# Patient Record
Sex: Female | Born: 1955 | Race: White | Hispanic: No | Marital: Married | Smoking: Former smoker
Health system: Southern US, Community
[De-identification: ages and names within clinical notes are randomized; demographics above are authoritative.]

## PROBLEM LIST (undated history)

## (undated) DIAGNOSIS — J449 Chronic obstructive pulmonary disease, unspecified: Secondary | ICD-10-CM

## (undated) DIAGNOSIS — J439 Emphysema, unspecified: Secondary | ICD-10-CM

## (undated) DIAGNOSIS — E119 Type 2 diabetes mellitus without complications: Secondary | ICD-10-CM

## (undated) DIAGNOSIS — K219 Gastro-esophageal reflux disease without esophagitis: Secondary | ICD-10-CM

## (undated) DIAGNOSIS — I10 Essential (primary) hypertension: Secondary | ICD-10-CM

## (undated) DIAGNOSIS — L900 Lichen sclerosus et atrophicus: Secondary | ICD-10-CM

## (undated) DIAGNOSIS — R0902 Hypoxemia: Secondary | ICD-10-CM

## (undated) DIAGNOSIS — J45909 Unspecified asthma, uncomplicated: Secondary | ICD-10-CM

## (undated) DIAGNOSIS — R002 Palpitations: Secondary | ICD-10-CM

## (undated) HISTORY — DX: Palpitations: R00.2

## (undated) HISTORY — DX: Emphysema, unspecified: J43.9

## (undated) HISTORY — PX: CHOLECYSTECTOMY: SHX55

## (undated) HISTORY — DX: Lichen sclerosus et atrophicus: L90.0

## (undated) HISTORY — DX: Type 2 diabetes mellitus without complications: E11.9

## (undated) HISTORY — DX: Chronic obstructive pulmonary disease, unspecified: J44.9

## (undated) HISTORY — DX: Hypoxemia: R09.02

## (undated) HISTORY — DX: Gastro-esophageal reflux disease without esophagitis: K21.9

## (undated) HISTORY — PX: REFRACTIVE SURGERY: SHX103

## (undated) HISTORY — DX: Essential (primary) hypertension: I10

## (undated) HISTORY — DX: Unspecified asthma, uncomplicated: J45.909

---

## 1988-06-13 HISTORY — PX: TUBAL LIGATION: SHX77

## 1998-01-02 ENCOUNTER — Other Ambulatory Visit: Admission: RE | Admit: 1998-01-02 | Discharge: 1998-01-02 | Payer: Self-pay | Admitting: Obstetrics and Gynecology

## 2000-11-21 ENCOUNTER — Other Ambulatory Visit: Admission: RE | Admit: 2000-11-21 | Discharge: 2000-11-21 | Payer: Self-pay | Admitting: Obstetrics and Gynecology

## 2002-05-23 ENCOUNTER — Ambulatory Visit (HOSPITAL_COMMUNITY): Admission: RE | Admit: 2002-05-23 | Discharge: 2002-05-23 | Payer: Self-pay | Admitting: Obstetrics and Gynecology

## 2002-05-23 ENCOUNTER — Encounter: Payer: Self-pay | Admitting: Obstetrics and Gynecology

## 2003-06-17 ENCOUNTER — Ambulatory Visit (HOSPITAL_COMMUNITY): Admission: RE | Admit: 2003-06-17 | Discharge: 2003-06-17 | Payer: Self-pay | Admitting: Obstetrics and Gynecology

## 2003-11-03 ENCOUNTER — Encounter: Admission: RE | Admit: 2003-11-03 | Discharge: 2003-11-03 | Payer: Self-pay | Admitting: Family Medicine

## 2005-03-07 ENCOUNTER — Ambulatory Visit (HOSPITAL_COMMUNITY): Admission: RE | Admit: 2005-03-07 | Discharge: 2005-03-07 | Payer: Self-pay | Admitting: Obstetrics and Gynecology

## 2006-10-10 ENCOUNTER — Ambulatory Visit: Payer: Self-pay | Admitting: Cardiology

## 2006-11-02 ENCOUNTER — Ambulatory Visit: Payer: Self-pay

## 2006-11-13 ENCOUNTER — Ambulatory Visit: Payer: Self-pay | Admitting: Cardiology

## 2009-04-17 ENCOUNTER — Ambulatory Visit (HOSPITAL_COMMUNITY): Admission: RE | Admit: 2009-04-17 | Discharge: 2009-04-17 | Payer: Self-pay | Admitting: Family Medicine

## 2009-06-13 DIAGNOSIS — E7800 Pure hypercholesterolemia, unspecified: Secondary | ICD-10-CM | POA: Insufficient documentation

## 2009-06-13 HISTORY — DX: Pure hypercholesterolemia, unspecified: E78.00

## 2010-05-13 HISTORY — PX: DILATION AND CURETTAGE, DIAGNOSTIC / THERAPEUTIC: SUR384

## 2010-05-25 ENCOUNTER — Ambulatory Visit (HOSPITAL_COMMUNITY)
Admission: RE | Admit: 2010-05-25 | Discharge: 2010-05-25 | Payer: Self-pay | Source: Home / Self Care | Attending: Family Medicine | Admitting: Family Medicine

## 2010-05-28 ENCOUNTER — Ambulatory Visit (HOSPITAL_COMMUNITY)
Admission: RE | Admit: 2010-05-28 | Discharge: 2010-05-28 | Payer: Self-pay | Source: Home / Self Care | Attending: Obstetrics and Gynecology | Admitting: Obstetrics and Gynecology

## 2010-06-18 ENCOUNTER — Encounter
Admission: RE | Admit: 2010-06-18 | Discharge: 2010-06-18 | Payer: Self-pay | Source: Home / Self Care | Attending: Family Medicine | Admitting: Family Medicine

## 2010-07-28 ENCOUNTER — Other Ambulatory Visit: Payer: Self-pay | Admitting: Family Medicine

## 2010-07-28 ENCOUNTER — Ambulatory Visit
Admission: RE | Admit: 2010-07-28 | Discharge: 2010-07-28 | Disposition: A | Discharge status: Home / Self Care | Payer: PRIVATE HEALTH INSURANCE | Source: Ambulatory Visit | Attending: Family Medicine | Admitting: Family Medicine

## 2010-07-28 DIAGNOSIS — R0602 Shortness of breath: Secondary | ICD-10-CM

## 2010-07-29 ENCOUNTER — Ambulatory Visit (HOSPITAL_COMMUNITY)
Admission: RE | Admit: 2010-07-29 | Discharge: 2010-07-29 | Disposition: A | Discharge status: Home / Self Care | Payer: PRIVATE HEALTH INSURANCE | Source: Ambulatory Visit | Attending: Family Medicine | Admitting: Family Medicine

## 2010-07-29 DIAGNOSIS — R0602 Shortness of breath: Secondary | ICD-10-CM | POA: Insufficient documentation

## 2010-08-24 LAB — DIFFERENTIAL
Eosinophils Relative: 4 % (ref 0–5)
Lymphocytes Relative: 22 % (ref 12–46)
Lymphs Abs: 2 10*3/uL (ref 0.7–4.0)
Monocytes Absolute: 0.5 10*3/uL (ref 0.1–1.0)

## 2010-08-24 LAB — COMPREHENSIVE METABOLIC PANEL
AST: 21 U/L (ref 0–37)
Albumin: 4.1 g/dL (ref 3.5–5.2)
Calcium: 9.6 mg/dL (ref 8.4–10.5)
Creatinine, Ser: 0.67 mg/dL (ref 0.4–1.2)
GFR calc Af Amer: >60 mL/min (ref >60)
GFR calc non Af Amer: >60 mL/min (ref >60)

## 2010-08-24 LAB — CBC
MCH: 30.1 pg (ref 26.0–34.0)
MCHC: 33.7 g/dL (ref 30.0–36.0)
Platelets: 327 10*3/uL (ref 150–400)
RDW: 13.1 % (ref 11.5–15.5)

## 2010-10-26 NOTE — Assessment & Plan Note (Signed)
[facility]                            CARDIOLOGY OFFICE NOTE   Amanda Garrett, Amanda Garrett                      MRN:          [id number]  DATE:11/13/2006                            DOB:          1956-01-03    PRIMARY CARE PHYSICIAN:  Windle Guard, M.D.   REASON FOR VISIT:  Follow cardiac testing.   HISTORY OF PRESENT ILLNESS:  I saw Amanda Garrett back in April.  She had had  an episode of chest pain and also some palpitations with documented  atrial ectopy.  I placed her on a beta blocker and she states that she  has been taking her ToprolXL in the evening and resting more  comfortably.  She still has some palpitations during the daytime,  however.  She had a follow up echocardiogram demonstrating normal left  ventricular systolic function with no major valvular abnormalities and  an ejection fraction of 60%.  Her Myoview study revealed an ejection  fraction of 67% with a minimal area of decreased perfusion in the mid to  distal anterior wall that was felt likely variable breast attenuation  rather than obvious ischemia.  She had no ST segment changes with  Adenosine and low level exercise.  Ms.  Lambert Garrett states that she has had no  further episodes of chest pain.  Today, we talked about advancing her  beta blocker to see if this helped her palpitations more and otherwise  continuing strategy of observation.  She was comfortable with this.   ALLERGIES:  No known drug allergies.   PRESENT MEDICATIONS:  1. Centrum Silver daily.  2. Os-Cal and Vitamin D 500 mg p.o. daily.  3. Lutein 6 mg daily.  4. Omega-3 fish oil supplements.  5. Vitamin E and C supplements.  6. Aspirin 81 mg p.o. daily.  7. Omeprazole 20 mg p.o. b.i.d.  8. Gemfibrozil 600 mg p.o. daily.  9. Metoprolol 25 mg p.o. q.h.s.   REVIEW OF SYSTEMS:  As described in history of present illness.   PHYSICAL EXAMINATION:  VITAL SIGNS:  Blood pressure 138/95, heart rate  90 with ectopic beats.  GENERAL:  The patient is comfortable and in no acute distress.  NECK:  No elevated jugular venous pressure.  Without bruits.  No  thyromegaly is noted.  LUNGS:  Clear with unlabored breathing at rest.  CARDIAC:  A largely regular rate and rhythm with frequent ectopic beats.  ABDOMEN:  No hepatomegaly or bruits.  EXTREMITIES:  No pitting edema.   IMPRESSION/RECOMMENDATIONS:  1. Palpitations and documented atrial ectopy.  I have asked Amanda Garrett      to increase her Toprol XL to 25 mg p.o. b.i.d.  I would, otherwise,      continue a strategy of observation.  We can certainly see her back      if symptoms progress.  Otherwise, she had overall low risk cardiac      testing showing normal ejection fraction and no obvious large      degree of ischemia.  I have asked her to be mindful of any symptoms      change,  and certainly if she has any progression, we can see her      back and evaluate things further.  2. Otherwise, continue regular follow up with Dr. Jeannetta Nap.     Jonelle Sidle, MD  Electronically Signed    SGM/MedQ  DD: 11/13/2006  DT: 11/14/2006  Job #: [id number]   cc:   Windle Guard, M.D.

## 2010-10-29 NOTE — Assessment & Plan Note (Signed)
**Amanda Garrett De-Identified via Obfuscation** Amanda Amanda Garrett                            Amanda Amanda Garrett   Amanda Amanda Garrett, Amanda Amanda Garrett                      MRN:          [id number]  DATE:10/10/2006                            DOB:          [birth year]    REASON FOR CONSULTATION:  Recent chest pain.   HISTORY OF PRESENT ILLNESS:  Amanda Amanda Garrett is a pleasant 55 year old woman  with a history of hyperlipidemia and reportedly previous reassuring  exercise Cardiolite back in 1996 (records not available). She has no  clearly documented history of myocardial infarction, type 2 diabetes  mellitus or longstanding hypertension. She states that she has been in  her usual state of health which includes dyspnea on exertion at least  over the last ten years associated with a 50 pound weight gain after  quitting smoking. She had eaten dinner out with her husband and was  riding home in the car approximately three or four weeks ago when she  experienced the sudden onset of a severe chest pain described a  pressure. It lasted approximately 5 to 10 minutes and then resolved by  taking deep breaths. She has not reported having any prior symptoms like  this and has had no subsequent recurrence. She had an electrocardiogram  obtained in Dr.  Jeannetta Nap office, the copy of which shows fairly  nonspecific ST-T wave changes. Repeat tracing today, shows sinus  tachycardia with frequent premature atrial complexes and nonspecific ST-  T wave changes. She reports a history of palpitations that is also  longstanding, but no associated syncope. She has not had any followup  cardiac evaluation in some time.   ALLERGIES:  No known drug allergies.   PRESENT MEDICATIONS:  1. Centrum Silver once daily.  2. Os-cal with vitamin D 500 mg p.o. daily.  3. Lutein 6 mg p.o. daily  4. Vitamin E and C supplements.  5. Aspirin 81 mg p.o. daily.  6. Gemfibrozil 600 mg p.o. daily.  7. Omeprazole 20 mg p.o. b.i.d.  8. Omega-3 supplements 1000 mg  p.o. daily.  9. P.r.n. Tylenol and Aleve.   PAST MEDICAL HISTORY:  Is as outlined above. She is also status post  previous tubal ligation and two Cesarean sections.   SOCIAL HISTORY:  The patient is married. She has four children. 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 She quit smoking back in 1998. Denies any alcohol use.   FAMILY HISTORY:  Was reviewed. Is non-contributory for premature  cardiovascular disease.   REVIEW OF SYSTEMS:  As described in the History of Present Illness. She  does have occasional reflux symptoms and arthritis. Does report dyspnea  on exertion, NYHA Class-II that has been chronic over the last ten  years. She reports approximately 50 pound weight gain since  discontinuing smoking.   PHYSICAL EXAMINATION:  Blood pressure 140/80, heart rate is in the 80s  with ectopic beats on my examination, although she does have some brief  runs of tachycardia. Weight is 195 pounds. The patient is comfortable  and in no acute distress.  HEENT: Conjunctivae is normal. Oropharynx is clear.  NECK: Supple, without elevated jugular pressure, without bruits. No  thyromegaly is noted.  LUNGS:  Are generally clear without labored breathing.  CARDIAC: Reveals a regular rate and rhythm with occasional to frequent  ectopy including some runs. No S3 gallop or pericardial rub is noted.  ABDOMEN: Soft. No bruits. No tenderness. No hepatomegaly.  EXTREMITIES: Exhibit no marked pitting edema.  SKIN: Is warm and dry.  Distal pulses are 1 to 2+.  MUSCULOSKELETAL: No kyphosis is noted.  NEURO/PSYCHIATRIC: The patient is alert and oriented x3.   IMPRESSIONS AND RECOMMENDATIONS:  1. Isolated episode of chest pain as outlined above. This is in a 55-      year-old woman with history of hyperlipidemia and remote tobacco      abuse. Blood pressure is elevated today, although there is no clear      longstanding history of hypertension. She  reports a previous      reassuring stress test in 1996, but has had no more recent testing.      Her electrocardiogram is nonspecific, but does show frequent atrial      ectopy. We discussed this today and our plan will be to proceed      with an echocardiogram to reassess left ventricular function and      also an adenosine Myoview for ischemic evaluation. In addition to      this, I will begin a trial of Toprol XL 25 mg daily to see if this      helps with her palpitations and frequent ectopy. I will have her      followup with me over the next three weeks to discuss the results.  2. Further plans to follow.     Jonelle Sidle, MD  Electronically Signed    SGM/MedQ  DD: 10/10/2006  DT: 10/10/2006  Job #: [id number]   cc:   Windle Guard, M.D.

## 2011-06-14 HISTORY — PX: KNEE ARTHROSCOPY: SUR90

## 2011-06-20 ENCOUNTER — Other Ambulatory Visit (HOSPITAL_COMMUNITY): Payer: Self-pay | Admitting: Obstetrics and Gynecology

## 2011-06-20 DIAGNOSIS — Z1231 Encounter for screening mammogram for malignant neoplasm of breast: Secondary | ICD-10-CM

## 2011-07-19 ENCOUNTER — Ambulatory Visit (HOSPITAL_COMMUNITY)
Admission: RE | Admit: 2011-07-19 | Discharge: 2011-07-19 | Disposition: A | Discharge status: Home / Self Care | Payer: PRIVATE HEALTH INSURANCE | Source: Ambulatory Visit | Attending: Obstetrics and Gynecology | Admitting: Obstetrics and Gynecology

## 2011-07-19 DIAGNOSIS — Z1231 Encounter for screening mammogram for malignant neoplasm of breast: Secondary | ICD-10-CM | POA: Insufficient documentation

## 2011-07-29 ENCOUNTER — Other Ambulatory Visit: Payer: Self-pay | Admitting: Family Medicine

## 2011-07-29 DIAGNOSIS — M25569 Pain in unspecified knee: Secondary | ICD-10-CM

## 2011-08-04 ENCOUNTER — Ambulatory Visit
Admission: RE | Admit: 2011-08-04 | Discharge: 2011-08-04 | Disposition: A | Discharge status: Home / Self Care | Payer: PRIVATE HEALTH INSURANCE | Source: Ambulatory Visit | Attending: Family Medicine | Admitting: Family Medicine

## 2011-08-04 DIAGNOSIS — M25569 Pain in unspecified knee: Secondary | ICD-10-CM

## 2012-11-27 ENCOUNTER — Other Ambulatory Visit (HOSPITAL_COMMUNITY): Payer: Self-pay | Admitting: Obstetrics and Gynecology

## 2012-11-27 DIAGNOSIS — Z1231 Encounter for screening mammogram for malignant neoplasm of breast: Secondary | ICD-10-CM

## 2012-11-30 ENCOUNTER — Ambulatory Visit (HOSPITAL_COMMUNITY)
Admission: RE | Admit: 2012-11-30 | Discharge: 2012-11-30 | Disposition: A | Discharge status: Home / Self Care | Payer: PRIVATE HEALTH INSURANCE | Source: Ambulatory Visit | Attending: Obstetrics and Gynecology | Admitting: Obstetrics and Gynecology

## 2012-11-30 DIAGNOSIS — Z1231 Encounter for screening mammogram for malignant neoplasm of breast: Secondary | ICD-10-CM | POA: Insufficient documentation

## 2012-12-04 ENCOUNTER — Other Ambulatory Visit: Payer: Self-pay | Admitting: Obstetrics and Gynecology

## 2012-12-04 DIAGNOSIS — R928 Other abnormal and inconclusive findings on diagnostic imaging of breast: Secondary | ICD-10-CM

## 2012-12-18 ENCOUNTER — Ambulatory Visit
Admission: RE | Admit: 2012-12-18 | Discharge: 2012-12-18 | Disposition: A | Discharge status: Home / Self Care | Payer: PRIVATE HEALTH INSURANCE | Source: Ambulatory Visit | Attending: Obstetrics and Gynecology | Admitting: Obstetrics and Gynecology

## 2012-12-18 DIAGNOSIS — R928 Other abnormal and inconclusive findings on diagnostic imaging of breast: Secondary | ICD-10-CM

## 2013-11-20 ENCOUNTER — Other Ambulatory Visit: Payer: Self-pay | Admitting: Otolaryngology

## 2013-11-20 DIAGNOSIS — H9190 Unspecified hearing loss, unspecified ear: Secondary | ICD-10-CM

## 2013-11-27 ENCOUNTER — Ambulatory Visit
Admission: RE | Admit: 2013-11-27 | Discharge: 2013-11-27 | Disposition: A | Discharge status: Home / Self Care | Payer: PRIVATE HEALTH INSURANCE | Source: Ambulatory Visit | Attending: Otolaryngology | Admitting: Otolaryngology

## 2013-11-27 DIAGNOSIS — H9190 Unspecified hearing loss, unspecified ear: Secondary | ICD-10-CM

## 2013-11-27 MED ORDER — GADOBENATE DIMEGLUMINE 529 MG/ML IV SOLN
17.0000 mL | Freq: Once | INTRAVENOUS | Status: AC | PRN
  Administered 2013-11-27: 17 mL via INTRAVENOUS

## 2017-04-21 ENCOUNTER — Other Ambulatory Visit: Payer: Self-pay | Admitting: Obstetrics and Gynecology

## 2017-04-21 DIAGNOSIS — N63 Unspecified lump in unspecified breast: Secondary | ICD-10-CM

## 2017-04-21 DIAGNOSIS — R928 Other abnormal and inconclusive findings on diagnostic imaging of breast: Secondary | ICD-10-CM

## 2017-04-28 ENCOUNTER — Ambulatory Visit
Admission: RE | Admit: 2017-04-28 | Discharge: 2017-04-28 | Disposition: A | Discharge status: Home / Self Care | Payer: PRIVATE HEALTH INSURANCE | Source: Ambulatory Visit | Attending: Obstetrics and Gynecology | Admitting: Obstetrics and Gynecology

## 2017-04-28 ENCOUNTER — Other Ambulatory Visit: Payer: Self-pay | Admitting: Obstetrics and Gynecology

## 2017-04-28 DIAGNOSIS — R928 Other abnormal and inconclusive findings on diagnostic imaging of breast: Secondary | ICD-10-CM

## 2017-04-28 DIAGNOSIS — N63 Unspecified lump in unspecified breast: Secondary | ICD-10-CM

## 2017-08-15 ENCOUNTER — Ambulatory Visit
Admission: RE | Admit: 2017-08-15 | Discharge: 2017-08-15 | Disposition: A | Discharge status: Home / Self Care | Payer: PRIVATE HEALTH INSURANCE | Source: Ambulatory Visit | Attending: Family Medicine | Admitting: Family Medicine

## 2017-08-15 ENCOUNTER — Other Ambulatory Visit: Payer: Self-pay | Admitting: Family Medicine

## 2017-08-15 DIAGNOSIS — R059 Cough, unspecified: Secondary | ICD-10-CM

## 2017-08-15 DIAGNOSIS — R05 Cough: Secondary | ICD-10-CM

## 2017-10-27 ENCOUNTER — Other Ambulatory Visit: Payer: PRIVATE HEALTH INSURANCE

## 2018-04-09 ENCOUNTER — Other Ambulatory Visit: Payer: Self-pay | Admitting: Obstetrics and Gynecology

## 2018-04-09 ENCOUNTER — Ambulatory Visit
Admission: RE | Admit: 2018-04-09 | Discharge: 2018-04-09 | Disposition: A | Discharge status: Home / Self Care | Payer: PRIVATE HEALTH INSURANCE | Source: Ambulatory Visit | Attending: Obstetrics and Gynecology | Admitting: Obstetrics and Gynecology

## 2018-04-09 DIAGNOSIS — N63 Unspecified lump in unspecified breast: Secondary | ICD-10-CM

## 2018-04-09 DIAGNOSIS — R928 Other abnormal and inconclusive findings on diagnostic imaging of breast: Secondary | ICD-10-CM

## 2018-04-09 DIAGNOSIS — Z1231 Encounter for screening mammogram for malignant neoplasm of breast: Secondary | ICD-10-CM

## 2018-04-11 ENCOUNTER — Other Ambulatory Visit: Payer: Self-pay | Admitting: Obstetrics and Gynecology

## 2018-04-11 DIAGNOSIS — N63 Unspecified lump in unspecified breast: Secondary | ICD-10-CM

## 2018-04-23 ENCOUNTER — Ambulatory Visit
Admission: RE | Admit: 2018-04-23 | Discharge: 2018-04-23 | Disposition: A | Discharge status: Home / Self Care | Payer: PRIVATE HEALTH INSURANCE | Source: Ambulatory Visit | Attending: Obstetrics and Gynecology | Admitting: Obstetrics and Gynecology

## 2018-04-23 ENCOUNTER — Ambulatory Visit: Payer: PRIVATE HEALTH INSURANCE

## 2018-04-23 DIAGNOSIS — N63 Unspecified lump in unspecified breast: Secondary | ICD-10-CM

## 2018-10-10 ENCOUNTER — Other Ambulatory Visit: Payer: PRIVATE HEALTH INSURANCE

## 2018-10-22 ENCOUNTER — Other Ambulatory Visit: Payer: PRIVATE HEALTH INSURANCE

## 2019-05-29 ENCOUNTER — Other Ambulatory Visit: Payer: Self-pay | Admitting: Obstetrics and Gynecology

## 2019-05-29 DIAGNOSIS — N6320 Unspecified lump in the left breast, unspecified quadrant: Secondary | ICD-10-CM

## 2019-06-12 ENCOUNTER — Other Ambulatory Visit: Payer: Self-pay

## 2019-06-12 ENCOUNTER — Ambulatory Visit
Admission: RE | Admit: 2019-06-12 | Discharge: 2019-06-12 | Disposition: A | Discharge status: Home / Self Care | Payer: Medicare Other | Source: Ambulatory Visit | Attending: Obstetrics and Gynecology | Admitting: Obstetrics and Gynecology

## 2019-06-12 ENCOUNTER — Ambulatory Visit
Admission: RE | Admit: 2019-06-12 | Discharge: 2019-06-12 | Disposition: A | Discharge status: Home / Self Care | Payer: Medicare HMO | Source: Ambulatory Visit | Attending: Obstetrics and Gynecology | Admitting: Obstetrics and Gynecology

## 2019-06-12 DIAGNOSIS — N6320 Unspecified lump in the left breast, unspecified quadrant: Secondary | ICD-10-CM

## 2019-08-10 ENCOUNTER — Ambulatory Visit: Payer: PRIVATE HEALTH INSURANCE

## 2019-08-15 ENCOUNTER — Ambulatory Visit: Payer: Medicare Other | Attending: Internal Medicine

## 2019-08-15 DIAGNOSIS — Z23 Encounter for immunization: Secondary | ICD-10-CM | POA: Insufficient documentation

## 2019-08-15 NOTE — Progress Notes (Signed)
   Covid-19 Vaccination Clinic  Name:  Amanda Garrett    MRN: [id number] DOB: August 06, 1955  08/15/2019  Amanda Garrett was observed post Covid-19 immunization for 15 minutes without incident. She was provided with Vaccine Information Sheet and instruction to access the V-Safe system.   Amanda Garrett was instructed to call 911 with any severe reactions post vaccine: Marland Kitchen Difficulty breathing  . Swelling of face and throat  . A fast heartbeat  . A bad rash all over body  . Dizziness and weakness   Immunizations Administered    Name Date Dose VIS Date Route   Pfizer COVID-19 Vaccine 08/15/2019  4:05 PM 0.3 mL 05/24/2019 Intramuscular   Manufacturer: [place]   Lot: [id number]   [place]: [id number]

## 2019-08-20 ENCOUNTER — Ambulatory Visit: Payer: PRIVATE HEALTH INSURANCE

## 2019-09-11 ENCOUNTER — Ambulatory Visit: Payer: Medicare Other | Attending: Internal Medicine

## 2019-09-11 DIAGNOSIS — Z23 Encounter for immunization: Secondary | ICD-10-CM

## 2019-09-11 NOTE — Progress Notes (Addendum)
   Covid-19 Vaccination Clinic  Name:  Amanda Garrett    MRN: [id number] DOB: September 29, 1955  09/11/2019  [name] was observed post Covid-19 immunization for 30 minutes without incident. She was provided with Vaccine Information Sheet and instruction to access the V-Safe system.   [name] was instructed to call 911 with any severe reactions post vaccine: Marland Kitchen Difficulty breathing  . Swelling of face and throat  . A fast heartbeat  . A bad rash all over body  . Dizziness and weakness   Immunizations Administered    Name Date Dose VIS Date Route   Pfizer COVID-19 Vaccine 09/11/2019  1:40 PM 0.3 mL 05/24/2019 Intramuscular   Manufacturer: [place]   Lot: [id number]   [place]: [id number]

## 2020-03-02 ENCOUNTER — Ambulatory Visit: Payer: Medicare Other | Attending: Internal Medicine

## 2020-03-02 DIAGNOSIS — Z23 Encounter for immunization: Secondary | ICD-10-CM

## 2020-03-02 NOTE — Progress Notes (Signed)
   Covid-19 Vaccination Clinic  Name:  BRYELLA DIVINEY    MRN: [id number] DOB: Oct 06, 1955  03/02/2020  Ms. Brosious was observed post Covid-19 immunization for 15 minutes without incident. She was provided with Vaccine Information Sheet and instruction to access the V-Safe system.   Ms. Volante was instructed to call 911 with any severe reactions post vaccine: Marland Kitchen Difficulty breathing  . Swelling of face and throat  . A fast heartbeat  . A bad rash all over body  . Dizziness and weakness

## 2020-07-23 ENCOUNTER — Other Ambulatory Visit: Payer: Self-pay | Admitting: Obstetrics and Gynecology

## 2020-07-23 DIAGNOSIS — Z1231 Encounter for screening mammogram for malignant neoplasm of breast: Secondary | ICD-10-CM

## 2020-09-11 ENCOUNTER — Other Ambulatory Visit: Payer: Self-pay

## 2020-09-11 ENCOUNTER — Ambulatory Visit
Admission: RE | Admit: 2020-09-11 | Discharge: 2020-09-11 | Disposition: A | Discharge status: Home / Self Care | Payer: Medicare Other | Source: Ambulatory Visit | Attending: Obstetrics and Gynecology | Admitting: Obstetrics and Gynecology

## 2020-09-11 DIAGNOSIS — Z1231 Encounter for screening mammogram for malignant neoplasm of breast: Secondary | ICD-10-CM

## 2020-10-01 ENCOUNTER — Encounter: Payer: Self-pay | Admitting: Gastroenterology

## 2020-11-09 ENCOUNTER — Inpatient Hospital Stay (HOSPITAL_COMMUNITY)
Admission: EM | Admit: 2020-11-09 | Discharge: 2020-11-11 | DRG: 190 | Disposition: A | Discharge status: Home / Self Care | Payer: Medicare Other | Attending: Family Medicine | Admitting: Family Medicine

## 2020-11-09 ENCOUNTER — Other Ambulatory Visit: Payer: Self-pay

## 2020-11-09 ENCOUNTER — Emergency Department (HOSPITAL_COMMUNITY): Payer: Medicare Other

## 2020-11-09 ENCOUNTER — Encounter (HOSPITAL_COMMUNITY): Payer: Self-pay | Admitting: Student

## 2020-11-09 DIAGNOSIS — Z20822 Contact with and (suspected) exposure to covid-19: Secondary | ICD-10-CM | POA: Diagnosis present

## 2020-11-09 DIAGNOSIS — Z833 Family history of diabetes mellitus: Secondary | ICD-10-CM | POA: Diagnosis not present

## 2020-11-09 DIAGNOSIS — E860 Dehydration: Secondary | ICD-10-CM | POA: Diagnosis present

## 2020-11-09 DIAGNOSIS — K219 Gastro-esophageal reflux disease without esophagitis: Secondary | ICD-10-CM | POA: Diagnosis present

## 2020-11-09 DIAGNOSIS — R7303 Prediabetes: Secondary | ICD-10-CM | POA: Diagnosis present

## 2020-11-09 DIAGNOSIS — Z87891 Personal history of nicotine dependence: Secondary | ICD-10-CM

## 2020-11-09 DIAGNOSIS — Z888 Allergy status to other drugs, medicaments and biological substances status: Secondary | ICD-10-CM

## 2020-11-09 DIAGNOSIS — I10 Essential (primary) hypertension: Secondary | ICD-10-CM | POA: Diagnosis present

## 2020-11-09 DIAGNOSIS — J189 Pneumonia, unspecified organism: Secondary | ICD-10-CM | POA: Diagnosis present

## 2020-11-09 DIAGNOSIS — Z8249 Family history of ischemic heart disease and other diseases of the circulatory system: Secondary | ICD-10-CM | POA: Diagnosis not present

## 2020-11-09 DIAGNOSIS — J440 Chronic obstructive pulmonary disease with acute lower respiratory infection: Secondary | ICD-10-CM | POA: Diagnosis present

## 2020-11-09 DIAGNOSIS — E875 Hyperkalemia: Secondary | ICD-10-CM | POA: Diagnosis present

## 2020-11-09 DIAGNOSIS — N179 Acute kidney failure, unspecified: Secondary | ICD-10-CM | POA: Diagnosis present

## 2020-11-09 DIAGNOSIS — Z91040 Latex allergy status: Secondary | ICD-10-CM | POA: Diagnosis not present

## 2020-11-09 DIAGNOSIS — E7800 Pure hypercholesterolemia, unspecified: Secondary | ICD-10-CM | POA: Diagnosis present

## 2020-11-09 DIAGNOSIS — J9811 Atelectasis: Secondary | ICD-10-CM | POA: Diagnosis present

## 2020-11-09 DIAGNOSIS — J9601 Acute respiratory failure with hypoxia: Secondary | ICD-10-CM | POA: Diagnosis present

## 2020-11-09 DIAGNOSIS — J441 Chronic obstructive pulmonary disease with (acute) exacerbation: Principal | ICD-10-CM | POA: Diagnosis present

## 2020-11-09 LAB — CBC WITH DIFFERENTIAL/PLATELET
Abs Immature Granulocytes: 0.03 10*3/uL (ref 0.00–0.07)
Basophils Absolute: 0 10*3/uL (ref 0.0–0.1)
Basophils Relative: 0 %
Eosinophils Absolute: 0.1 10*3/uL (ref 0.0–0.5)
Eosinophils Relative: 1 %
HCT: 48.4 % — ABNORMAL HIGH (ref 36.0–46.0)
Hemoglobin: 14.9 g/dL (ref 12.0–15.0)
Immature Granulocytes: 0 %
Lymphocytes Relative: 16 %
Lymphs Abs: 1.6 10*3/uL (ref 0.7–4.0)
MCH: 30.7 pg (ref 26.0–34.0)
MCHC: 30.8 g/dL (ref 30.0–36.0)
MCV: 99.8 fL (ref 80.0–100.0)
Monocytes Absolute: 0.8 10*3/uL (ref 0.1–1.0)
Monocytes Relative: 7 %
Neutro Abs: 7.9 10*3/uL — ABNORMAL HIGH (ref 1.7–7.7)
Neutrophils Relative %: 76 %
Platelets: 276 10*3/uL (ref 150–400)
RBC: 4.85 MIL/uL (ref 3.87–5.11)
RDW: 14.3 % (ref 11.5–15.5)
WBC: 10.4 10*3/uL (ref 4.0–10.5)
nRBC: 0 % (ref 0.0–0.2)

## 2020-11-09 LAB — BASIC METABOLIC PANEL
Anion gap: 8 (ref 5–15)
BUN: 23 mg/dL (ref 8–23)
CO2: 32 mmol/L (ref 22–32)
Calcium: 9.6 mg/dL (ref 8.9–10.3)
Chloride: 96 mmol/L — ABNORMAL LOW (ref 98–111)
Creatinine, Ser: 0.85 mg/dL (ref 0.44–1.00)
GFR, Estimated: >60 mL/min (ref >60)
Glucose, Bld: 146 mg/dL — ABNORMAL HIGH (ref 70–99)
Potassium: 4.9 mmol/L (ref 3.5–5.1)
Sodium: 136 mmol/L (ref 135–145)

## 2020-11-09 LAB — BLOOD GAS, ARTERIAL
Acid-Base Excess: 2.9 mmol/L — ABNORMAL HIGH (ref 0.0–2.0)
Acid-Base Excess: 0.5 mmol/L (ref 0.0–2.0)
Bicarbonate: 32.5 mmol/L — ABNORMAL HIGH (ref 20.0–28.0)
Bicarbonate: 28.9 mmol/L — ABNORMAL HIGH (ref 20.0–28.0)
Drawn by: 25780
Drawn by: 25788
FIO2: 36
FIO2: 28
O2 Saturation: 90.9 %
O2 Saturation: 93.4 %
Patient temperature: 98.6
Patient temperature: 98.6
pCO2 arterial: 76.6 mmHg (ref 32.0–48.0)
pCO2 arterial: 65.3 mmHg (ref 32.0–48.0)
pH, Arterial: 7.251 — ABNORMAL LOW (ref 7.350–7.450)
pH, Arterial: 7.268 — ABNORMAL LOW (ref 7.350–7.450)
pO2, Arterial: 71 mmHg — ABNORMAL LOW (ref 83.0–108.0)
pO2, Arterial: 78.5 mmHg — ABNORMAL LOW (ref 83.0–108.0)

## 2020-11-09 LAB — RESP PANEL BY RT-PCR (FLU A&B, COVID) ARPGX2
Influenza A by PCR: NEGATIVE
Influenza B by PCR: NEGATIVE
SARS Coronavirus 2 by RT PCR: NEGATIVE

## 2020-11-09 LAB — CBC
HCT: 48.3 % — ABNORMAL HIGH (ref 36.0–46.0)
Hemoglobin: 14.8 g/dL (ref 12.0–15.0)
MCH: 30.7 pg (ref 26.0–34.0)
MCHC: 30.6 g/dL (ref 30.0–36.0)
MCV: 100.2 fL — ABNORMAL HIGH (ref 80.0–100.0)
Platelets: 219 10*3/uL (ref 150–400)
RBC: 4.82 MIL/uL (ref 3.87–5.11)
RDW: 14.2 % (ref 11.5–15.5)
WBC: 12.2 10*3/uL — ABNORMAL HIGH (ref 4.0–10.5)
nRBC: 0 % (ref 0.0–0.2)

## 2020-11-09 LAB — BRAIN NATRIURETIC PEPTIDE: B Natriuretic Peptide: 247.9 pg/mL — ABNORMAL HIGH (ref 0.0–100.0)

## 2020-11-09 LAB — GLUCOSE, CAPILLARY
Glucose-Capillary: 160 mg/dL — ABNORMAL HIGH (ref 70–99)
Glucose-Capillary: 304 mg/dL — ABNORMAL HIGH (ref 70–99)

## 2020-11-09 LAB — CREATININE, SERUM
Creatinine, Ser: 1.05 mg/dL — ABNORMAL HIGH (ref 0.44–1.00)
GFR, Estimated: 58 mL/min — ABNORMAL LOW (ref >60)

## 2020-11-09 LAB — D-DIMER, QUANTITATIVE: D-Dimer, Quant: 1.02 ug/mL-FEU — ABNORMAL HIGH (ref 0.00–0.50)

## 2020-11-09 MED ORDER — ASPIRIN EC 81 MG PO TBEC
81.0000 mg | DELAYED_RELEASE_TABLET | Freq: Every day | ORAL | Status: DC
  Administered 2020-11-10 – 2020-11-11 (×2): 81 mg via ORAL
  Filled 2020-11-09 (×2): qty 1

## 2020-11-09 MED ORDER — PROPRANOLOL HCL 10 MG PO TABS
10.0000 mg | ORAL_TABLET | Freq: Two times a day (BID) | ORAL | Status: DC
  Administered 2020-11-09 – 2020-11-11 (×4): 10 mg via ORAL
  Filled 2020-11-09 (×4): qty 1

## 2020-11-09 MED ORDER — ALBUTEROL (5 MG/ML) CONTINUOUS INHALATION SOLN
10.0000 mg/h | INHALATION_SOLUTION | Freq: Once | RESPIRATORY_TRACT | Status: AC
  Administered 2020-11-09: 10 mg/h via RESPIRATORY_TRACT
  Filled 2020-11-09: qty 20

## 2020-11-09 MED ORDER — ADULT MULTIVITAMIN W/MINERALS CH
1.0000 | ORAL_TABLET | Freq: Every day | ORAL | Status: DC
  Administered 2020-11-10 – 2020-11-11 (×2): 1 via ORAL
  Filled 2020-11-09 (×2): qty 1

## 2020-11-09 MED ORDER — ATORVASTATIN CALCIUM 10 MG PO TABS
10.0000 mg | ORAL_TABLET | Freq: Every day | ORAL | Status: DC
  Administered 2020-11-10 – 2020-11-11 (×2): 10 mg via ORAL
  Filled 2020-11-09 (×2): qty 1

## 2020-11-09 MED ORDER — ASCORBIC ACID 500 MG PO TABS
500.0000 mg | ORAL_TABLET | Freq: Every day | ORAL | Status: DC
  Administered 2020-11-10 – 2020-11-11 (×2): 500 mg via ORAL
  Filled 2020-11-09 (×2): qty 1

## 2020-11-09 MED ORDER — PREDNISONE 20 MG PO TABS
40.0000 mg | ORAL_TABLET | Freq: Every day | ORAL | Status: DC
  Administered 2020-11-11: 40 mg via ORAL
  Filled 2020-11-09: qty 2

## 2020-11-09 MED ORDER — LORATADINE 10 MG PO TABS
10.0000 mg | ORAL_TABLET | Freq: Every day | ORAL | Status: DC
  Administered 2020-11-10 – 2020-11-11 (×2): 10 mg via ORAL
  Filled 2020-11-09 (×2): qty 1

## 2020-11-09 MED ORDER — CYANOCOBALAMIN 500 MCG PO TABS
500.0000 ug | ORAL_TABLET | Freq: Every day | ORAL | Status: DC
  Administered 2020-11-10 – 2020-11-11 (×2): 500 ug via ORAL
  Filled 2020-11-09 (×4): qty 1

## 2020-11-09 MED ORDER — LACTATED RINGERS IV SOLN: Freq: Once | INTRAVENOUS | Status: AC

## 2020-11-09 MED ORDER — LACTATED RINGERS IV SOLN: INTRAVENOUS | Status: DC

## 2020-11-09 MED ORDER — ENOXAPARIN SODIUM 40 MG/0.4ML IJ SOSY
40.0000 mg | PREFILLED_SYRINGE | INTRAMUSCULAR | Status: DC
  Administered 2020-11-09 – 2020-11-10 (×2): 40 mg via SUBCUTANEOUS
  Filled 2020-11-09 (×2): qty 0.4

## 2020-11-09 MED ORDER — AZITHROMYCIN 250 MG PO TABS
500.0000 mg | ORAL_TABLET | Freq: Every day | ORAL | Status: DC
  Administered 2020-11-10 – 2020-11-11 (×2): 500 mg via ORAL
  Filled 2020-11-09 (×2): qty 2

## 2020-11-09 MED ORDER — METHYLPREDNISOLONE SODIUM SUCC 125 MG IJ SOLR
125.0000 mg | Freq: Once | INTRAMUSCULAR | Status: AC
  Administered 2020-11-09: 125 mg via INTRAVENOUS
  Filled 2020-11-09: qty 2

## 2020-11-09 MED ORDER — ALBUTEROL SULFATE HFA 108 (90 BASE) MCG/ACT IN AERS
2.0000 | INHALATION_SPRAY | Freq: Once | RESPIRATORY_TRACT | Status: AC
  Administered 2020-11-09: 2 via RESPIRATORY_TRACT
  Filled 2020-11-09: qty 6.7

## 2020-11-09 MED ORDER — INSULIN ASPART 100 UNIT/ML IJ SOLN
0.0000 [IU] | Freq: Three times a day (TID) | INTRAMUSCULAR | Status: DC
  Administered 2020-11-10 – 2020-11-11 (×4): 1 [IU] via SUBCUTANEOUS

## 2020-11-09 MED ORDER — IOHEXOL 350 MG/ML SOLN
100.0000 mL | Freq: Once | INTRAVENOUS | Status: AC | PRN
  Administered 2020-11-09: 75 mL via INTRAVENOUS

## 2020-11-09 MED ORDER — MONTELUKAST SODIUM 10 MG PO TABS
10.0000 mg | ORAL_TABLET | Freq: Every day | ORAL | Status: DC
  Administered 2020-11-09 – 2020-11-10 (×2): 10 mg via ORAL
  Filled 2020-11-09 (×2): qty 1

## 2020-11-09 MED ORDER — GUAIFENESIN ER 600 MG PO TB12
600.0000 mg | ORAL_TABLET | Freq: Two times a day (BID) | ORAL | Status: DC
  Administered 2020-11-09 – 2020-11-11 (×4): 600 mg via ORAL
  Filled 2020-11-09 (×4): qty 1

## 2020-11-09 MED ORDER — VITAMIN D3 25 MCG (1000 UNIT) PO TABS
1000.0000 [IU] | ORAL_TABLET | Freq: Every day | ORAL | Status: DC
  Administered 2020-11-10 – 2020-11-11 (×2): 1000 [IU] via ORAL
  Filled 2020-11-09 (×2): qty 1

## 2020-11-09 MED ORDER — SODIUM CHLORIDE 0.9 % IV SOLN
500.0000 mg | INTRAVENOUS | Status: AC
  Administered 2020-11-09: 500 mg via INTRAVENOUS
  Filled 2020-11-09: qty 500

## 2020-11-09 MED ORDER — ACETAMINOPHEN 325 MG PO TABS
650.0000 mg | ORAL_TABLET | Freq: Four times a day (QID) | ORAL | Status: DC | PRN
  Administered 2020-11-09 – 2020-11-10 (×3): 650 mg via ORAL
  Filled 2020-11-09 (×3): qty 2

## 2020-11-09 MED ORDER — ACETAMINOPHEN 650 MG RE SUPP: 650.0000 mg | Freq: Four times a day (QID) | RECTAL | Status: DC | PRN

## 2020-11-09 MED ORDER — ALBUTEROL SULFATE (2.5 MG/3ML) 0.083% IN NEBU: 2.5000 mg | INHALATION_SOLUTION | RESPIRATORY_TRACT | Status: DC | PRN

## 2020-11-09 MED ORDER — AMLODIPINE BESYLATE 5 MG PO TABS
5.0000 mg | ORAL_TABLET | Freq: Every day | ORAL | Status: DC
  Administered 2020-11-09 – 2020-11-11 (×3): 5 mg via ORAL
  Filled 2020-11-09 (×3): qty 1

## 2020-11-09 MED ORDER — SODIUM CHLORIDE 0.9 % IV SOLN
1.0000 g | Freq: Every day | INTRAVENOUS | Status: DC
  Administered 2020-11-09 – 2020-11-10 (×2): 1 g via INTRAVENOUS
  Filled 2020-11-09 (×2): qty 1
  Filled 2020-11-09: qty 10

## 2020-11-09 MED ORDER — METHYLPREDNISOLONE SODIUM SUCC 125 MG IJ SOLR
60.0000 mg | Freq: Two times a day (BID) | INTRAMUSCULAR | Status: AC
  Administered 2020-11-09 – 2020-11-10 (×2): 60 mg via INTRAVENOUS
  Filled 2020-11-09 (×2): qty 2

## 2020-11-09 MED ORDER — MOMETASONE FURO-FORMOTEROL FUM 200-5 MCG/ACT IN AERO
2.0000 | INHALATION_SPRAY | Freq: Two times a day (BID) | RESPIRATORY_TRACT | Status: DC
  Administered 2020-11-10 – 2020-11-11 (×3): 2 via RESPIRATORY_TRACT
  Filled 2020-11-09 (×2): qty 8.8

## 2020-11-09 NOTE — ED Notes (Signed)
Critical Value- MD A. Allen notified   PCO2 76.6

## 2020-11-09 NOTE — ED Triage Notes (Addendum)
Shob, chest pain and weakness x 3 days, Sates 72% RA in triage, DOE.

## 2020-11-09 NOTE — ED Provider Notes (Signed)
[place] DEPT Provider Note   CSN: [id number] Arrival date & time: 11/09/20  1246     History Chief Complaint  Patient presents with  . Shortness of Breath  . Weakness    Amanda Garrett is a 65 y.o. female.  65 year old female with history of COPD who presents with increased shortness of breath x2 days.  States has had dyspnea on exertion.  Has also had some left-sided pleuritic pain in her chest.  No leg pain or swelling.  No fever or chills.  Patient is supposed to be on home oxygen but has not been prescribed this.  Denies any history of blood loss recently.  She initially presented to triage here with O2 sat 55%.  She occasionally does use albuterol inhalers        Past Medical History:  Diagnosis Date  . Asthma   . GERD (gastroesophageal reflux disease)   . Hypercholesteremia 2011  . Hypertension   . Lichen sclerosus et atrophicus    vulva    There are no problems to display for this patient.   Past Surgical History:  Procedure Laterality Date  . [place]  . CHOLECYSTECTOMY    . DILATION AND CURETTAGE, DIAGNOSTIC / THERAPEUTIC  05/2010   hysteroscopy, polypectomy  . KNEE ARTHROSCOPY Right 2013  . REFRACTIVE SURGERY    . TUBAL LIGATION  1990   dr Pearline Cables     OB History   No obstetric history on file.     Family History  Problem Relation Age of Onset  . Diabetes Mother   . Diabetes Father   . Diabetes Maternal Grandfather   . Hypertension Maternal Grandfather   . Breast cancer Paternal Grandmother     Social History   Tobacco Use  . Smoking status: Former Smoker  Substance Use Topics  . Alcohol use: Yes    Comment: occasional    Home Medications Prior to Admission medications   Not on File    Allergies    Spiriva respimat [tiotropium bromide monohydrate]  Review of Systems   Review of Systems  All other systems reviewed and are negative.   Physical Exam Updated Vital  Signs BP (!) 149/100   Pulse 81   Temp (!) 97.5 F (36.4 C) (Oral)   Resp 16   SpO2 92%   Physical Exam Vitals and nursing note reviewed.  Constitutional:      General: She is not in acute distress.    Appearance: Normal appearance. She is well-developed. She is not toxic-appearing.  HENT:     Head: Normocephalic and atraumatic.  Eyes:     General: Lids are normal.     Conjunctiva/sclera: Conjunctivae normal.     Pupils: Pupils are equal, round, and reactive to light.  Neck:     Thyroid: No thyroid mass.     Trachea: No tracheal deviation.  Cardiovascular:     Rate and Rhythm: Normal rate and regular rhythm.     Heart sounds: Normal heart sounds. No murmur heard. No gallop.   Pulmonary:     Effort: Pulmonary effort is normal. Tachypnea present. No respiratory distress.     Breath sounds: No stridor. Decreased breath sounds and wheezing present. No rhonchi or rales.  Abdominal:     General: Bowel sounds are normal. There is no distension.     Palpations: Abdomen is soft.     Tenderness: There is no abdominal tenderness. There is no rebound.  Musculoskeletal:        General: No tenderness. Normal range of motion.     Cervical back: Normal range of motion and neck supple.  Skin:    General: Skin is warm and dry.     Findings: No abrasion or rash.  Neurological:     Mental Status: She is alert and oriented to person, place, and time.     GCS: GCS eye subscore is 4. GCS verbal subscore is 5. GCS motor subscore is 6.     Cranial Nerves: No cranial nerve deficit.     Sensory: No sensory deficit.  Psychiatric:        Speech: Speech normal.        Behavior: Behavior normal.     ED Results / Procedures / Treatments   Labs (all labs ordered are listed, but only abnormal results are displayed) Labs Reviewed  RESP PANEL BY RT-PCR (FLU A&B, COVID) ARPGX2  BLOOD GAS, ARTERIAL  CBC WITH DIFFERENTIAL/PLATELET  BASIC METABOLIC PANEL  BRAIN NATRIURETIC PEPTIDE  D-DIMER,  QUANTITATIVE    EKG EKG Interpretation  Date/Time:  Monday Nov 09 2020 13:17:58 EDT Ventricular Rate:  86 PR Interval:  128 QRS Duration: 78 QT Interval:  347 QTC Calculation: 415 R Axis:   87 Text Interpretation: Sinus rhythm Borderline right axis deviation Low voltage, precordial leads RSR' in V1 or V2, probably normal variant No significant change since last tracing Confirmed by Gareth Morgan [phone]) on 11/10/2020 3:10:45 PM   Radiology No results found.  Procedures Procedures   Medications Ordered in ED Medications  lactated ringers infusion (has no administration in time range)  albuterol (VENTOLIN HFA) 108 (90 Base) MCG/ACT inhaler 2 puff (has no administration in time range)  methylPREDNISolone sodium succinate (SOLU-MEDROL) 125 mg/2 mL injection 125 mg (has no administration in time range)    ED Course  I have reviewed the triage vital signs and the nursing notes.  Pertinent labs & imaging results that were available during my care of the patient were reviewed by me and considered in my medical decision making (see chart for details).    MDM Rules/Calculators/A&P                          Patient treated for COPD exacerbation and will be admitted to the hospital Final Clinical Impression(s) / ED Diagnoses Final diagnoses:  None    Rx / DC Orders ED Discharge Orders    None       Lacretia Leigh, MD 11/13/20 [phone]

## 2020-11-09 NOTE — H&P (Signed)
History and Physical    Amanda Garrett OZD:[id number] DOB: 1956-05-11 DOA: 11/09/2020  PCP: Leonard Downing, MD  Patient coming from: home  Chief Complaint: short of breath  HPI: Amanda Garrett is a 65 y.o. female with medical history significant of COPD, pre-diabetes. Presenting with dyspnea. She reports her symptoms started yesterday evening. She felt lightheaded and short of breath as she was trying to move about her house. She tried her normal inhalers and a nebulized treatment for help. However, they did not provide complete relief. Her symptoms continued through the night and worsened to the point she felt the need to come to the ED today for help. She reports that she also had some episodic left chest pain that was exacerbated with cough. "Pinprick" sensation that was minor in her opinion. She otherwise denies any other aggravating or alleviating factors.    ED Course: She was given solumedrol, inhalers. CXR showed bibasilar atalectasis vs consolidations. ABG showed pH 7.25 w/ pCO2 of 77, pO2 71. TRH was called for admission.    Review of Systems:  Review of systems is otherwise negative for all not mentioned in HPI.   PMHx Past Medical History:  Diagnosis Date  . Asthma   . GERD (gastroesophageal reflux disease)   . Hypercholesteremia 2011  . Hypertension   . Lichen sclerosus et atrophicus    vulva    PSHx Past Surgical History:  Procedure Laterality Date  . [place]  . CHOLECYSTECTOMY    . DILATION AND CURETTAGE, DIAGNOSTIC / THERAPEUTIC  05/2010   hysteroscopy, polypectomy  . KNEE ARTHROSCOPY Right 2013  . REFRACTIVE SURGERY    . TUBAL LIGATION  1990   dr Pearline Cables    SocHx  reports that she has quit smoking. She does not have any smokeless tobacco history on file. She reports current alcohol use. No history on file for drug use.  Allergies  Allergen Reactions  . Spiriva Respimat [Tiotropium Bromide Monohydrate] Anaphylaxis     FamHx Family History  Problem Relation Age of Onset  . Diabetes Mother   . Diabetes Father   . Diabetes Maternal Grandfather   . Hypertension Maternal Grandfather   . Breast cancer Paternal Grandmother     Prior to Admission medications   Not on File    Physical Exam: Vitals:   11/09/20 1435 11/09/20 1445 11/09/20 1500 11/09/20 1510  BP: (!) 122/110   136/73  Pulse: 86 78 81 79  Resp: (!) 21 14 20 18   Temp:      TempSrc:      SpO2: 94% 94% 92% 94%    General: 65 y.o. female resting in bed in NAD Eyes: PERRL, normal sclera ENMT: Nares patent w/o discharge, orophaynx clear, dentition normal, ears w/o discharge/lesions/ulcers Neck: Supple, trachea midline Cardiovascular: RRR, +S1, S2, no m/g/r, equal pulses throughout, reproducible CP on exam Respiratory: decreased at bases, poor air movement throughout, normal WOB on 2L [place] GI: BS+, obesity, NDNT, no masses noted, no organomegaly noted MSK: No e/c/c Skin: No rashes, bruises, ulcerations noted Neuro: A&O x 3, no focal deficits Psyc: Appropriate interaction and affect, calm/cooperative  Labs on Admission: I have personally reviewed following labs and imaging studies  CBC: Recent Labs  Lab 11/09/20 1328  WBC 10.4  NEUTROABS 7.9*  HGB 14.9  HCT 48.4*  MCV 99.8  PLT 259   Basic Metabolic Panel: Recent Labs  Lab 11/09/20 1328  NA 136  K 4.9  CL  96*  CO2 32  GLUCOSE 146*  BUN 23  CREATININE 0.85  CALCIUM 9.6   GFR: CrCl cannot be calculated (Unknown ideal weight.). Liver Function Tests: No results for input(s): AST, ALT, ALKPHOS, BILITOT, PROT, ALBUMIN in the last 168 hours. No results for input(s): LIPASE, AMYLASE in the last 168 hours. No results for input(s): AMMONIA in the last 168 hours. Coagulation Profile: No results for input(s): INR, PROTIME in the last 168 hours. Cardiac Enzymes: No results for input(s): CKTOTAL, CKMB, CKMBINDEX, TROPONINI in the last 168 hours. BNP (last 3 results) No  results for input(s): PROBNP in the last 8760 hours. HbA1C: No results for input(s): HGBA1C in the last 72 hours. CBG: No results for input(s): GLUCAP in the last 168 hours. Lipid Profile: No results for input(s): CHOL, HDL, LDLCALC, TRIG, CHOLHDL, LDLDIRECT in the last 72 hours. Thyroid Function Tests: No results for input(s): TSH, T4TOTAL, FREET4, T3FREE, THYROIDAB in the last 72 hours. Anemia Panel: No results for input(s): VITAMINB12, FOLATE, FERRITIN, TIBC, IRON, RETICCTPCT in the last 72 hours. Urine analysis: No results found for: COLORURINE, APPEARANCEUR, LABSPEC, PHURINE, GLUCOSEU, HGBUR, BILIRUBINUR, KETONESUR, PROTEINUR, UROBILINOGEN, NITRITE, LEUKOCYTESUR  Radiological Exams on Admission: CT Angio Chest PE W/Cm &/Or Wo Cm  Result Date: 11/09/2020 CLINICAL DATA:  Chest pain, weakness for 3 days EXAM: CT ANGIOGRAPHY CHEST WITH CONTRAST TECHNIQUE: Multidetector CT imaging of the chest was performed using the standard protocol during bolus administration of intravenous contrast. Multiplanar CT image reconstructions and MIPs were obtained to evaluate the vascular anatomy. CONTRAST:  20mL OMNIPAQUE IOHEXOL 350 MG/ML SOLN COMPARISON:  None. FINDINGS: Cardiovascular: Satisfactory opacification of the pulmonary arteries to the segmental level. No evidence of pulmonary embolism. Normal heart size. No pericardial effusion. Thoracic aortic atherosclerosis. Mediastinum/Nodes: No enlarged mediastinal, hilar, or axillary lymph nodes. Thyroid gland, trachea, and esophagus demonstrate no significant findings. Lungs/Pleura: No pleural effusion or pneumothorax. Centrilobular and paraseptal emphysema. Mild lingular atelectasis. No focal consolidation. Upper Abdomen: No acute abnormality. Ventral upper abdominal fat containing hernia with a narrow neck. Musculoskeletal: No acute osseous abnormality. No aggressive osseous lesion. Review of the MIP images confirms the above findings. IMPRESSION: 1. No evidence  of pulmonary embolus. 2.  Aortic Atherosclerosis (ICD10-I70.0) 3. Emphysema (ICD10-J43.9). Electronically Signed   By: Kathreen Devoid   On: 11/09/2020 14:50   DG Chest Port 1 View  Result Date: 11/09/2020 CLINICAL DATA:  65 year old female with history of shortness of breath, chest pain and weakness for the past 3 days. EXAM: PORTABLE CHEST 1 VIEW COMPARISON:  Chest x-ray 08/15/2017. FINDINGS: Bibasilar opacities which may reflect areas of atelectasis and/or consolidation. Small left pleural effusion. No definite right pleural effusion. No pneumothorax. No evidence of pulmonary edema. Heart size is mildly enlarged. Upper mediastinal contours are within normal limits. Aortic atherosclerosis. IMPRESSION: 1. Bibasilar areas of atelectasis and/or consolidation with small left pleural effusion. 2. Mild cardiomegaly. 3. Aortic atherosclerosis. Electronically Signed   By: Vinnie Langton M.D.   On: 11/09/2020 14:26    EKG: Independently reviewed. Sinus, no st elevations  Assessment/Plan COPD exacerbation CAP     - admit to inpt, progressive     - continue nebs, steroids     - add rocephin, zithro, guaifenesin      - rpt ABG, if she is not compensated, consider BiPap trial     - I believe she would benefit from BiPap or CPap at home, consult TOC for possible trilogy machine  HTN     - start amlodipine  Hx of pre-diabetes     - check A1c, add carb mod diet, glucose checks, SSI  Morbid obesity     - counsel on lifestyle, diet changes     - outpt follow up  Elevated BNP     - no no pulm edema/vasc congestion see on cxr     - volume status is ok     - can defer to outpt w/u  DVT prophylaxis: lovenox  Code Status: FULL  Family Communication: w/ daughter at bedside  Consults called: None   Status is: Inpatient  Remains inpatient appropriate because:Inpatient level of care appropriate due to severity of illness   Dispo: The patient is from: Home              Anticipated d/c is to: Home               Patient currently is not medically stable to d/c.   Difficult to place patient No  Jonnie Finner DO [facility]  If 7PM-7AM, please contact night-coverage [email or web address]  11/09/2020, 3:34 PM

## 2020-11-09 NOTE — ED Notes (Signed)
Patient transported to CT 

## 2020-11-09 NOTE — Progress Notes (Addendum)
Date and time results received: 11/09/20 0714 (use smartphrase ".now" to insert current time)  Test: ABG Critical Value: PCO2 65.3  Name of Provider Notified: Mansy  Orders Received? Or Actions Taken?: None

## 2020-11-09 NOTE — ED Notes (Signed)
On arrival to room oxygen saturations in the 50s, placed patient on 4L acute oxygen which improved saturations to 92%.  Zenia Resides, EDP at bedside to assess patient.

## 2020-11-09 NOTE — Progress Notes (Signed)
PIV consult: RFA attempt unsuccessful. Site unremarkable. Pt requested to continue using R AC site for now. Discussed US guided PIV, pt declines at this time. Please re-enter consult if needed.

## 2020-11-10 ENCOUNTER — Encounter (HOSPITAL_COMMUNITY): Payer: Self-pay | Admitting: Internal Medicine

## 2020-11-10 ENCOUNTER — Ambulatory Visit: Payer: PRIVATE HEALTH INSURANCE | Admitting: Gastroenterology

## 2020-11-10 DIAGNOSIS — J441 Chronic obstructive pulmonary disease with (acute) exacerbation: Principal | ICD-10-CM

## 2020-11-10 LAB — COMPREHENSIVE METABOLIC PANEL
ALT: 150 U/L — ABNORMAL HIGH (ref 0–44)
AST: 161 U/L — ABNORMAL HIGH (ref 15–41)
Albumin: 3.8 g/dL (ref 3.5–5.0)
Alkaline Phosphatase: 82 U/L (ref 38–126)
Anion gap: 6 (ref 5–15)
BUN: 20 mg/dL (ref 8–23)
CO2: 32 mmol/L (ref 22–32)
Calcium: 9 mg/dL (ref 8.9–10.3)
Chloride: 101 mmol/L (ref 98–111)
Creatinine, Ser: 0.66 mg/dL (ref 0.44–1.00)
GFR, Estimated: >60 mL/min (ref >60)
Glucose, Bld: 158 mg/dL — ABNORMAL HIGH (ref 70–99)
Potassium: 5.3 mmol/L — ABNORMAL HIGH (ref 3.5–5.1)
Sodium: 139 mmol/L (ref 135–145)
Total Bilirubin: 0.4 mg/dL (ref 0.3–1.2)
Total Protein: 6.9 g/dL (ref 6.5–8.1)

## 2020-11-10 LAB — CBC
HCT: 46.4 % — ABNORMAL HIGH (ref 36.0–46.0)
Hemoglobin: 14.2 g/dL (ref 12.0–15.0)
MCH: 30.5 pg (ref 26.0–34.0)
MCHC: 30.6 g/dL (ref 30.0–36.0)
MCV: 99.8 fL (ref 80.0–100.0)
Platelets: 165 10*3/uL (ref 150–400)
RBC: 4.65 MIL/uL (ref 3.87–5.11)
RDW: 14.2 % (ref 11.5–15.5)
WBC: 9.3 10*3/uL (ref 4.0–10.5)
nRBC: 0 % (ref 0.0–0.2)

## 2020-11-10 LAB — GLUCOSE, CAPILLARY
Glucose-Capillary: 154 mg/dL — ABNORMAL HIGH (ref 70–99)
Glucose-Capillary: 162 mg/dL — ABNORMAL HIGH (ref 70–99)
Glucose-Capillary: 167 mg/dL — ABNORMAL HIGH (ref 70–99)

## 2020-11-10 LAB — PROCALCITONIN: Procalcitonin: <0.1 ng/mL

## 2020-11-10 LAB — POTASSIUM: Potassium: 5.5 mmol/L — ABNORMAL HIGH (ref 3.5–5.1)

## 2020-11-10 LAB — HIV ANTIBODY (ROUTINE TESTING W REFLEX): HIV Screen 4th Generation wRfx: NONREACTIVE

## 2020-11-10 MED ORDER — CALCIUM CARBONATE ANTACID 500 MG PO CHEW
1.0000 | CHEWABLE_TABLET | Freq: Once | ORAL | Status: AC
  Administered 2020-11-10: 200 mg via ORAL
  Filled 2020-11-10: qty 1

## 2020-11-10 MED ORDER — SODIUM ZIRCONIUM CYCLOSILICATE 10 G PO PACK
10.0000 g | PACK | Freq: Once | ORAL | Status: AC
  Administered 2020-11-10: 10 g via ORAL
  Filled 2020-11-10: qty 1

## 2020-11-10 NOTE — Progress Notes (Signed)
SATURATION QUALIFICATIONS: (This note is used to comply with regulatory documentation for home oxygen)  Patient Saturations on Room Air at Rest = 88%  Patient Saturations on Room Air while Ambulating 79%  Patient Saturations on 1 Liters of oxygen while Ambulating 92%   Please briefly explain why patient needs home oxygen: Pt with desaturations on room air with ambulation and increased shortness of breath

## 2020-11-10 NOTE — Progress Notes (Addendum)
PROGRESS NOTE    Amanda Garrett  KGM:[id number] DOB: 01/21/1956 DOA: 11/09/2020 PCP: Leonard Downing, MD    Brief Narrative: This 65 years old female with PMH significant for COPD, prediabetes presented in the ED with worsening shortness of breath.  Patient reports her symptoms started yesterday when she was trying to get out of her house,  felt dizzy and short of breath.  She has tried her regular inhaler and had nebulization treatment but did not get any relief.  Her symptoms continued to get worse to the point where she decided to come to the ED.  She also reported episodic left-sided chest pain associated with cough.  In the ED she was given inhalers, IV Solu-Medrol.  ABG shows pH 7.25, PCO2 of 77 and PO2 of 71. Patient is admitted for COPD exacerbation.   Assessment & Plan:   Active Problems:   COPD exacerbation ([place])  Acute hypoxic respiratory failure secondary to COPD exacerbation.: Patient presented with progressive shortness of breath, cough. She is found to be hypoxic requiring 2 L of supplemental oxygen to maintain saturation above 94%. CXR: Bibasilar areas of atelectasis and/or consolidation with small left pleural effusion. CT chest :  No PE.  Emphysema. Continue scheduled and as needed  Duoneb nebulization. Continue Solu-Medrol 40 every 8 hours.   Continue Rocephin and Zithromax for possible pneumonia Obtain procalcitonin and de-escalate antibiotics. Continue to wean oxygen as tolerated.  Hypertension: Continue amlodipine.    Morbid obesity:  Counseled on lifestyle and diet changes.  Prediabetes: Obtain hemoglobin A1c.  Elevated BNP:  Patient does not appear fluid overloaded.   Needs outpatient work-up.  Hyperkalemia: Potassium 5.5. Lokelma x1 given Recheck labs.   DVT prophylaxis: Lovenox Code Status: Full code. Family Communication:  Daughter at bedside. Disposition Plan:   Status is: Inpatient  Remains inpatient appropriate because:Inpatient  level of care appropriate due to severity of illness   Dispo: The patient is from: Home              Anticipated d/c is to: Home in 1-2 days              Patient currently is not medically stable to d/c.   Difficult to place patient No   Consultants:   None  Procedures:   Antimicrobials:  Anti-infectives (From admission, onward)   Start     Dose/Rate Route Frequency Ordered Stop   11/10/20 1000  azithromycin (ZITHROMAX) tablet 500 mg       "Followed by" Linked Group Details   500 mg Oral Daily 11/09/20 1731 11/14/20 0959   11/09/20 1815  azithromycin (ZITHROMAX) 500 mg in sodium chloride 0.9 % 250 mL IVPB       "Followed by" Linked Group Details   500 mg 250 mL/hr over 60 Minutes Intravenous Every 24 hours 11/09/20 1731 11/09/20 2311   11/09/20 1800  cefTRIAXone (ROCEPHIN) 1 g in sodium chloride 0.9 % 100 mL IVPB        1 g 200 mL/hr over 30 Minutes Intravenous Daily-1800 11/09/20 1737        Subjective: Patient was seen and examined at bedside.  She is sitting comfortably on the bed with oxygen.   She reports getting short of breath while ambulation but denies any chest pain.   She reports cough is getting better with cough medication.  Objective: Vitals:   11/10/20 0443 11/10/20 0900 11/10/20 0933 11/10/20 0952  BP: (!) 142/78 (!) 143/75  (!) 142/74  Pulse: 72 72  70  Resp: 18 20    Temp: (!) 97.5 F (36.4 C) 98.1 F (36.7 C)    TempSrc: Oral Oral    SpO2: 94% 96% (!) 88%     Intake/Output Summary (Last 24 hours) at 11/10/2020 1210 Last data filed at 11/10/2020 0349 Gross per 24 hour  Intake 100 ml  Output --  Net 100 ml   There were no vitals filed for this visit.  Examination:  General exam: Appears calm and comfortable, not in any acute distress. Respiratory system: Clear to auscultation. Respiratory effort normal. Cardiovascular system: S1 & S2 heard, RRR. No JVD, murmurs, rubs, gallops or clicks. No pedal edema. Gastrointestinal system: Abdomen is  nondistended, soft and nontender. No organomegaly or masses felt. Normal bowel sounds heard. Central nervous system: Alert and oriented. No focal neurological deficits. Extremities: Symmetric 5 x 5 power.  No edema, no cyanosis, no clubbing. Skin: No rashes, lesions or ulcers Psychiatry: Judgement and insight appear normal. Mood & affect appropriate.     Data Reviewed: I have personally reviewed following labs and imaging studies  CBC: Recent Labs  Lab 11/09/20 1328 11/09/20 1755 11/10/20 0429  WBC 10.4 12.2* 9.3  NEUTROABS 7.9*  --   --   HGB 14.9 14.8 14.2  HCT 48.4* 48.3* 46.4*  MCV 99.8 100.2* 99.8  PLT 276 219 562   Basic Metabolic Panel: Recent Labs  Lab 11/09/20 1328 11/09/20 1755 11/10/20 0429 11/10/20 0803  NA 136  --  139  --   K 4.9  --  5.3* 5.5*  CL 96*  --  101  --   CO2 32  --  32  --   GLUCOSE 146*  --  158*  --   BUN 23  --  20  --   CREATININE 0.85 1.05* 0.66  --   CALCIUM 9.6  --  9.0  --    GFR: CrCl cannot be calculated (Unknown ideal weight.). Liver Function Tests: Recent Labs  Lab 11/10/20 0429  AST 161*  ALT 150*  ALKPHOS 82  BILITOT 0.4  PROT 6.9  ALBUMIN 3.8   No results for input(s): LIPASE, AMYLASE in the last 168 hours. No results for input(s): AMMONIA in the last 168 hours. Coagulation Profile: No results for input(s): INR, PROTIME in the last 168 hours. Cardiac Enzymes: No results for input(s): CKTOTAL, CKMB, CKMBINDEX, TROPONINI in the last 168 hours. BNP (last 3 results) No results for input(s): PROBNP in the last 8760 hours. HbA1C: No results for input(s): HGBA1C in the last 72 hours. CBG: Recent Labs  Lab 11/09/20 1819 11/09/20 2138 11/10/20 0732 11/10/20 1136  GLUCAP 160* 304* 154* 162*   Lipid Profile: No results for input(s): CHOL, HDL, LDLCALC, TRIG, CHOLHDL, LDLDIRECT in the last 72 hours. Thyroid Function Tests: No results for input(s): TSH, T4TOTAL, FREET4, T3FREE, THYROIDAB in the last 72  hours. Anemia Panel: No results for input(s): VITAMINB12, FOLATE, FERRITIN, TIBC, IRON, RETICCTPCT in the last 72 hours. Sepsis Labs: No results for input(s): PROCALCITON, LATICACIDVEN in the last 168 hours.  Recent Results (from the past 240 hour(s))  Resp Panel by RT-PCR (Flu A&B, Covid) Nasopharyngeal Swab     Status: None   Collection Time: 11/09/20  1:29 PM   Specimen: Nasopharyngeal Swab; Nasopharyngeal(NP) swabs in vial transport medium  Result Value Ref Range Status   SARS Coronavirus 2 by RT PCR NEGATIVE NEGATIVE Final    Comment: (NOTE) SARS-CoV-2 target nucleic acids are NOT DETECTED.  The SARS-CoV-2 RNA  is generally detectable in upper respiratory specimens during the acute phase of infection. The lowest concentration of SARS-CoV-2 viral copies this assay can detect is 138 copies/mL. A negative result does not preclude SARS-Cov-2 infection and should not be used as the sole basis for treatment or other patient management decisions. A negative result may occur with  improper specimen collection/handling, submission of specimen other than nasopharyngeal swab, presence of viral mutation(s) within the areas targeted by this assay, and inadequate number of viral copies(<138 copies/mL). A negative result must be combined with clinical observations, patient history, and epidemiological information. The expected result is Negative.  Fact Sheet for Patients:  EntrepreneurPulse.com.au  Fact Sheet for Healthcare Providers:  IncredibleEmployment.be  This test is no t yet approved or cleared by the [place] FDA and  has been authorized for detection and/or diagnosis of SARS-CoV-2 by FDA under an Emergency Use Authorization (EUA). This EUA will remain  in effect (meaning this test can be used) for the duration of the COVID-19 declaration under Section 564(b)(1) of the Act, 21 U.S.C.section 360bbb-3(b)(1), unless the authorization is  terminated  or revoked sooner.       Influenza A by PCR NEGATIVE NEGATIVE Final   Influenza B by PCR NEGATIVE NEGATIVE Final    Comment: (NOTE) The Xpert Xpress SARS-CoV-2/FLU/RSV plus assay is intended as an aid in the diagnosis of influenza from Nasopharyngeal swab specimens and should not be used as a sole basis for treatment. Nasal washings and aspirates are unacceptable for Xpert Xpress SARS-CoV-2/FLU/RSV testing.  Fact Sheet for Patients: EntrepreneurPulse.com.au  Fact Sheet for Healthcare Providers: IncredibleEmployment.be  This test is not yet approved or cleared by the [place] FDA and has been authorized for detection and/or diagnosis of SARS-CoV-2 by FDA under an Emergency Use Authorization (EUA). This EUA will remain in effect (meaning this test can be used) for the duration of the COVID-19 declaration under Section 564(b)(1) of the Act, 21 U.S.C. section 360bbb-3(b)(1), unless the authorization is terminated or revoked.  Performed at [facility], [place] [address] [place], [place] [address]    Radiology Studies: CT Angio Chest PE W/Cm &/Or Wo Cm  Result Date: 11/09/2020 CLINICAL DATA:  Chest pain, weakness for 3 days EXAM: CT ANGIOGRAPHY CHEST WITH CONTRAST TECHNIQUE: Multidetector CT imaging of the chest was performed using the standard protocol during bolus administration of intravenous contrast. Multiplanar CT image reconstructions and MIPs were obtained to evaluate the vascular anatomy. CONTRAST:  76mL OMNIPAQUE IOHEXOL 350 MG/ML SOLN COMPARISON:  None. FINDINGS: Cardiovascular: Satisfactory opacification of the pulmonary arteries to the segmental level. No evidence of pulmonary embolism. Normal heart size. No pericardial effusion. Thoracic aortic atherosclerosis. Mediastinum/Nodes: No enlarged mediastinal, hilar, or axillary lymph nodes. Thyroid gland, trachea, and esophagus demonstrate no significant  findings. Lungs/Pleura: No pleural effusion or pneumothorax. Centrilobular and paraseptal emphysema. Mild lingular atelectasis. No focal consolidation. Upper Abdomen: No acute abnormality. Ventral upper abdominal fat containing hernia with a narrow neck. Musculoskeletal: No acute osseous abnormality. No aggressive osseous lesion. Review of the MIP images confirms the above findings. IMPRESSION: 1. No evidence of pulmonary embolus. 2.  Aortic Atherosclerosis (ICD10-I70.0) 3. Emphysema (ICD10-J43.9). Electronically Signed   By: Kathreen Devoid   On: 11/09/2020 14:50   DG Chest Port 1 View  Result Date: 11/09/2020 CLINICAL DATA:  65 year old female with history of shortness of breath, chest pain and weakness for the past 3 days. EXAM: PORTABLE CHEST 1 VIEW COMPARISON:  Chest x-ray 08/15/2017. FINDINGS: Bibasilar opacities which may  reflect areas of atelectasis and/or consolidation. Small left pleural effusion. No definite right pleural effusion. No pneumothorax. No evidence of pulmonary edema. Heart size is mildly enlarged. Upper mediastinal contours are within normal limits. Aortic atherosclerosis. IMPRESSION: 1. Bibasilar areas of atelectasis and/or consolidation with small left pleural effusion. 2. Mild cardiomegaly. 3. Aortic atherosclerosis. Electronically Signed   By: Vinnie Langton M.D.   On: 11/09/2020 14:26   Scheduled Meds: . amLODipine  5 mg Oral Daily  . vitamin C  500 mg Oral Daily  . aspirin EC  81 mg Oral Daily  . atorvastatin  10 mg Oral Daily  . azithromycin  500 mg Oral Daily  . cholecalciferol  1,000 Units Oral Daily  . enoxaparin (LOVENOX) injection  40 mg Subcutaneous Q24H  . guaiFENesin  600 mg Oral BID  . insulin aspart  0-6 Units Subcutaneous TID WC  . loratadine  10 mg Oral Q breakfast  . mometasone-formoterol  2 puff Inhalation BID  . montelukast  10 mg Oral QHS  . multivitamin with minerals  1 tablet Oral Daily  . [START ON 11/11/2020] predniSONE  40 mg Oral Q breakfast  .  propranolol  10 mg Oral Q12H  . sodium zirconium cyclosilicate  10 g Oral Once  . vitamin B-12  500 mcg Oral Daily   Continuous Infusions: . cefTRIAXone (ROCEPHIN)  IV Stopped (11/09/20 2311)     LOS: 1 day    Time spent: 35 mins    Jodey Burbano, MD [facility]   If 7PM-7AM, please contact night-coverage

## 2020-11-10 NOTE — TOC Initial Note (Signed)
Transition of Care [facility]) - Initial/Assessment Note    Patient Details  Name: Amanda Garrett MRN: [id number] Date of Birth: [birth year]  [facility] CM/SW Contact:    Dessa Phi, RN Phone Number: 11/10/2020, 12:52 PM  Clinical Narrative: Noted 02 sats qualify for home 02-MD ordered home 02-[place] dtr agree to ome 02-[facility] to deliver home 02 travel tank to rm prior d/c. Tandy Gaw has concerns about home 02 travel tank while out of town over Mariea Stable will discuss home 02 tank limits via phone provided tel#[phone].Family will transport home on own.                  Expected Discharge Plan: Home/Self Care Barriers to Discharge: Continued Medical Work up   Patient Goals and CMS Choice Patient states their goals for this hospitalization and ongoing recovery are:: go home CMS Medicare.gov Compare Post Acute Care list provided to:: Patient Represenative (must comment) ([place] dtr [phone]) Choice offered to / list presented to : Adult Children  Expected Discharge Plan and Services Expected Discharge Plan: Home/Self Care   Discharge Planning Services: CM Consult Post Acute Care Choice: Durable Medical Equipment Living arrangements for the past 2 months: Single Family Home                 DME Arranged: Oxygen DME Agency: [employer] Date DME Agency Contacted: 11/10/20 Time DME Agency Contacted: 1540 Representative spoke with at DME Agency: Thedore Mins            Prior Living Arrangements/Services Living arrangements for the past 2 months: [place] Lives with:: Spouse Patient language and need for interpreter reviewed:: Yes Do you feel safe going back to the place where you live?: Yes      Need for Family Participation in Patient Care: No (Comment) Care giver support system in place?: Yes (comment)   Criminal Activity/Legal Involvement Pertinent to Current Situation/Hospitalization: No - Comment as needed  Activities of Daily  Living Home Assistive Devices/Equipment: None ADL Screening (condition at time of admission) Patient's cognitive ability adequate to safely complete daily activities?: Yes Is the patient deaf or have difficulty hearing?: No Does the patient have difficulty seeing, even when wearing glasses/contacts?: No Does the patient have difficulty concentrating, remembering, or making decisions?: No Patient able to express need for assistance with ADLs?: Yes Does the patient have difficulty dressing or bathing?: No Independently performs ADLs?: Yes (appropriate for developmental age) Does the patient have difficulty walking or climbing stairs?: No Weakness of Legs: None Weakness of Arms/Hands: None  Permission Sought/Granted Permission sought to share information with : Case Manager Permission granted to share information with : Yes, Verbal Permission Granted  Share Information with NAME: Case manager     Permission granted to share info w Relationship: [place] dtr [phone]     Emotional Assessment Appearance:: Appears stated age Attitude/Demeanor/Rapport: Gracious Affect (typically observed): Accepting Orientation: : Oriented to Self,Oriented to Place,Oriented to  Time Alcohol / Substance Use: Not Applicable Psych Involvement: No (comment)  Admission diagnosis:  COPD exacerbation ([place]) [J44.1] Patient Active Problem List   Diagnosis Date Noted  . COPD exacerbation ([place]) 11/09/2020   PCP:  Leonard Downing, MD Pharmacy:   [facility] [place] - [address] [place] ([place]) [place] [address] Phone: [phone] Fax: [phone]     Social Determinants of Health (SDOH) Interventions    Readmission Risk Interventions No flowsheet data found.

## 2020-11-11 DIAGNOSIS — J441 Chronic obstructive pulmonary disease with (acute) exacerbation: Secondary | ICD-10-CM | POA: Diagnosis not present

## 2020-11-11 LAB — CBC
HCT: 42.6 % (ref 36.0–46.0)
Hemoglobin: 13.2 g/dL (ref 12.0–15.0)
MCH: 30.7 pg (ref 26.0–34.0)
MCHC: 31 g/dL (ref 30.0–36.0)
MCV: 99.1 fL (ref 80.0–100.0)
Platelets: 194 10*3/uL (ref 150–400)
RBC: 4.3 MIL/uL (ref 3.87–5.11)
RDW: 13.8 % (ref 11.5–15.5)
WBC: 12.4 10*3/uL — ABNORMAL HIGH (ref 4.0–10.5)
nRBC: 0 % (ref 0.0–0.2)

## 2020-11-11 LAB — BASIC METABOLIC PANEL
Anion gap: 5 (ref 5–15)
BUN: 24 mg/dL — ABNORMAL HIGH (ref 8–23)
CO2: 34 mmol/L — ABNORMAL HIGH (ref 22–32)
Calcium: 8.8 mg/dL — ABNORMAL LOW (ref 8.9–10.3)
Chloride: 99 mmol/L (ref 98–111)
Creatinine, Ser: 0.65 mg/dL (ref 0.44–1.00)
GFR, Estimated: >60 mL/min (ref >60)
Glucose, Bld: 154 mg/dL — ABNORMAL HIGH (ref 70–99)
Potassium: 4.8 mmol/L (ref 3.5–5.1)
Sodium: 138 mmol/L (ref 135–145)

## 2020-11-11 LAB — HEMOGLOBIN A1C
Hgb A1c MFr Bld: 6.2 % — ABNORMAL HIGH (ref 4.8–5.6)
Mean Plasma Glucose: 131 mg/dL

## 2020-11-11 LAB — PHOSPHORUS: Phosphorus: 3 mg/dL (ref 2.5–4.6)

## 2020-11-11 LAB — MAGNESIUM: Magnesium: 2.2 mg/dL (ref 1.7–2.4)

## 2020-11-11 LAB — GLUCOSE, CAPILLARY: Glucose-Capillary: 157 mg/dL — ABNORMAL HIGH (ref 70–99)

## 2020-11-11 MED ORDER — CEFDINIR 300 MG PO CAPS: 300.0000 mg | ORAL_CAPSULE | Freq: Two times a day (BID) | ORAL | 0 refills | Status: DC

## 2020-11-11 MED ORDER — PREDNISONE 20 MG PO TABS: 40.0000 mg | ORAL_TABLET | Freq: Every day | ORAL | 0 refills | Status: DC

## 2020-11-11 NOTE — Discharge Summary (Signed)
Physician Discharge Summary  Amanda Garrett OVZ:[id number] DOB: November 23, 1955 DOA: 11/09/2020  PCP: Amanda Downing, MD  Admit date: 11/09/2020 .  Discharge date: 11/11/2020  Admitted From: Home.  Disposition:  Home  Recommendations for Outpatient Follow-up:  1. Follow up with PCP in 1-2 weeks. 2. Please obtain BMP/CBC in one week.. 3. Advised to take prednisone 40 mg daily for 4 days for COPD exacerbation. 4. Advised to take Omnicef 300 mg twice daily for 4 days to complete 5-day treatment for community-acquired pneumonia. 5. Patient is being discharged home on supplemental oxygen 1 L/min.  Home Health: None Equipment/Devices:Oxygen @ 1-2 L/M  Discharge Condition: Stable CODE STATUS:Full code Diet recommendation: Heart Healthy   Brief Summary / Hospital Course: This 65 years old female with PMH significant for COPD, prediabetes presented in the ED with worsening shortness of breath.  Patient reports her symptoms started yesterday when she was trying to get out of her house,  felt dizzy and short of breath.  She has tried her regular inhaler and had nebulization treatment but did not get any relief.  Her symptoms continued to get worse to the point where she decided to come to the ED.  She also reported episodic left-sided chest pain associated with cough.  In the ED she was given inhalers, IV Solu-Medrol.  ABG shows pH 7.25, PCO2 of 77 and PO2 of 71. Patient was admitted for COPD exacerbation.  She was continued on IV Solu-Medrol, scheduled and as needed nebulization with albuterol and ipratropium.  She was also started on Rocephin and Zithromax for community-acquired pneumonia.  Chest x-ray shows atelectasis but no infiltrate.  Patient feels much improved with continued treatment.  She ambulated and qualified for home oxygen.  Patient feels much better next day and want to be discharged.  Patient is being discharged home on prednisone 40 mg daily and Omnicef 300 mg twice daily for 4 days  to complete 5-day treatment.  Patient is being discharged home on home oxygen therapy 1 to 2 L as needed.  She was managed for below problems.  Discharge Diagnoses:  Active Problems:   COPD exacerbation ([place])  Acute hypoxic respiratory failure secondary to COPD exacerbation.: Patient presented with progressive shortness of breath, cough. She is found to be hypoxic requiring 2 L of supplemental oxygen to maintain saturation above 94%. CXR: Bibasilar areas of atelectasis and/or consolidation with small left pleural effusion. CT chest :  No PE.  Emphysema. Continue scheduled and as needed  Duoneb nebulization. Continue Solu-Medrol 40 every 8 hours.   Continue Rocephin and Zithromax for possible pneumonia Obtain procalcitonin and de-escalate antibiotics. Continue to wean oxygen as tolerated.  Hypertension: Continue amlodipine.    Morbid obesity:  Counseled on lifestyle and diet changes.  Prediabetes: outpatient follow up.  Elevated BNP:  Patient does not appear fluid overloaded.   Needs outpatient work-up.  Hyperkalemia: > Improved. Potassium 5.5. Lokelma x1 given Recheck labs.   Discharge Instructions  Discharge Instructions    Call MD for:  difficulty breathing, headache or visual disturbances   Complete by: As directed    Call MD for:  persistant dizziness or light-headedness   Complete by: As directed    Call MD for:  persistant nausea and vomiting   Complete by: As directed    Diet - low sodium heart healthy   Complete by: As directed    Diet Carb Modified   Complete by: As directed    Discharge instructions   Complete by: As directed  Advised to follow-up with primary care physician in 1 week. Advised to take prednisone 40 mg daily for 4 days for COPD exacerbation. Advised to take Omnicef 300 mg twice daily for 4 days to complete 5-day treatment for community-acquired pneumonia. Patient is being discharged home on supplemental oxygen 1 L/min.   Increase  activity slowly   Complete by: As directed      Allergies as of 11/11/2020      Reactions   Spiriva Respimat [tiotropium Bromide Monohydrate] Anaphylaxis   Latex Rash, Other (See Comments)   NO powdered gloves!!      Medication List    TAKE these medications   atorvastatin 10 MG tablet Commonly known as: LIPITOR Take 10 mg by mouth daily.   Bayer Low Dose 81 MG EC tablet Generic drug: aspirin Take 81 mg by mouth daily. Swallow whole.   budesonide-formoterol 160-4.5 MCG/ACT inhaler Commonly known as: SYMBICORT Inhale 2 puffs into the lungs 2 (two) times daily.   CAL-MAG-ZINC PO Take 1 tablet by mouth daily.   cefdinir 300 MG capsule Commonly known as: OMNICEF Take 1 capsule (300 mg total) by mouth 2 (two) times daily.   Centrum Silver Ultra Womens Tabs Take 1 tablet by mouth daily.   clobetasol ointment 0.05 % Commonly known as: TEMOVATE Apply 1 application topically daily as needed (in the perineal area).   loratadine 10 MG tablet Commonly known as: CLARITIN Take 10 mg by mouth in the morning.   metFORMIN 1000 MG tablet Commonly known as: GLUCOPHAGE Take 1,000 mg by mouth daily with breakfast.   montelukast 10 MG tablet Commonly known as: SINGULAIR Take 10 mg by mouth at bedtime.   nystatin cream Commonly known as: MYCOSTATIN Apply 1 application topically 2 (two) times daily as needed (to affected areas- for irritation).   Potassium 99 MG Tabs Take 99 mg by mouth daily.   predniSONE 20 MG tablet Commonly known as: DELTASONE Take 2 tablets (40 mg total) by mouth daily with breakfast. Start taking on: November 12, 2020   propranolol 10 MG tablet Commonly known as: INDERAL Take 10 mg by mouth every 12 (twelve) hours.   Sambucus Elderberry Immune Syrp Take 5 mLs by mouth daily.   triamcinolone 0.025 % cream Commonly known as: KENALOG Apply 1 application topically See admin instructions. Apply to the hands 2 times a day for rashes   Ventolin HFA 108 (90  Base) MCG/ACT inhaler Generic drug: albuterol Inhale 2 puffs into the lungs every 6 (six) hours as needed for wheezing or shortness of breath.   albuterol (2.5 MG/3ML) 0.083% nebulizer solution Commonly known as: PROVENTIL Take 2.5 mg by nebulization every 6 (six) hours as needed for wheezing or shortness of breath.   vitamin B-12 500 MCG tablet Commonly known as: CYANOCOBALAMIN Take 500 mcg by mouth daily.   vitamin C 500 MG tablet Commonly known as: ASCORBIC ACID Take 500 mg by mouth daily.   Vitamin D-3 25 MCG (1000 UT) Caps Take 1,000 Units by mouth daily.            Durable Medical Equipment  (From admission, onward)         Start     Ordered   11/10/20 1250  For home use only DME oxygen  Once       Question Answer Comment  Length of Need Lifetime   Mode or (Route) Nasal cannula   Liters per Minute 2   Frequency Continuous (stationary and portable oxygen unit needed)  Oxygen conserving device Yes   Oxygen delivery system Gas      11/10/20 1249          Follow-up Information    [facility] Follow up.   Why: home oxygen Contact information: [address] [place] [address] [phone]        Amanda Downing, MD Follow up in 1 week(s).   Specialty: Family Medicine Contact information: [place] [address] [phone]              Allergies  Allergen Reactions  . Spiriva Respimat [Tiotropium Bromide Monohydrate] Anaphylaxis  . Latex Rash and Other (See Comments)    NO powdered gloves!!    Consultations:  None   Procedures/Studies: CT Angio Chest PE W/Cm &/Or Wo Cm  Result Date: 11/09/2020 CLINICAL DATA:  Chest pain, weakness for 3 days EXAM: CT ANGIOGRAPHY CHEST WITH CONTRAST TECHNIQUE: Multidetector CT imaging of the chest was performed using the standard protocol during bolus administration of intravenous contrast. Multiplanar CT image reconstructions and MIPs were obtained to evaluate  the vascular anatomy. CONTRAST:  52mL OMNIPAQUE IOHEXOL 350 MG/ML SOLN COMPARISON:  None. FINDINGS: Cardiovascular: Satisfactory opacification of the pulmonary arteries to the segmental level. No evidence of pulmonary embolism. Normal heart size. No pericardial effusion. Thoracic aortic atherosclerosis. Mediastinum/Nodes: No enlarged mediastinal, hilar, or axillary lymph nodes. Thyroid gland, trachea, and esophagus demonstrate no significant findings. Lungs/Pleura: No pleural effusion or pneumothorax. Centrilobular and paraseptal emphysema. Mild lingular atelectasis. No focal consolidation. Upper Abdomen: No acute abnormality. Ventral upper abdominal fat containing hernia with a narrow neck. Musculoskeletal: No acute osseous abnormality. No aggressive osseous lesion. Review of the MIP images confirms the above findings. IMPRESSION: 1. No evidence of pulmonary embolus. 2.  Aortic Atherosclerosis (ICD10-I70.0) 3. Emphysema (ICD10-J43.9). Electronically Signed   By: Kathreen Devoid   On: 11/09/2020 14:50   DG Chest Port 1 View  Result Date: 11/09/2020 CLINICAL DATA:  65 year old female with history of shortness of breath, chest pain and weakness for the past 3 days. EXAM: PORTABLE CHEST 1 VIEW COMPARISON:  Chest x-ray 08/15/2017. FINDINGS: Bibasilar opacities which may reflect areas of atelectasis and/or consolidation. Small left pleural effusion. No definite right pleural effusion. No pneumothorax. No evidence of pulmonary edema. Heart size is mildly enlarged. Upper mediastinal contours are within normal limits. Aortic atherosclerosis. IMPRESSION: 1. Bibasilar areas of atelectasis and/or consolidation with small left pleural effusion. 2. Mild cardiomegaly. 3. Aortic atherosclerosis. Electronically Signed   By: Vinnie Langton M.D.   On: 11/09/2020 14:26      Subjective: Patient was seen and examined at bedside.  Overnight events noted.  Patient reports feeling much improved.   Patient wants to be discharged  home.  She denies any cough and shortness of breath.  Discharge Exam: Vitals:   11/10/20 2128 11/11/20 0517  BP: (!) 163/84 (!) 166/94  Pulse: 85 68  Resp: (!) 22 20  Temp: 98.7 F (37.1 C) 97.8 F (36.6 C)  SpO2: 95% 96%   Vitals:   11/10/20 0952 11/10/20 1308 11/10/20 2128 11/11/20 0517  BP: (!) 142/74 (!) 145/72 (!) 163/84 (!) 166/94  Pulse: 70 70 85 68  Resp:  20 (!) 22 20  Temp:  98.2 F (36.8 C) 98.7 F (37.1 C) 97.8 F (36.6 C)  TempSrc:  Oral Oral Oral  SpO2:  94% 95% 96%    General: Pt is alert, awake, not in acute distress Cardiovascular: RRR, S1/S2 +, no rubs, no gallops  Respiratory: CTA bilaterally, no wheezing, no rhonchi Abdominal: Soft, NT, ND, bowel sounds + Extremities: no edema, no cyanosis    The results of significant diagnostics from this hospitalization (including imaging, microbiology, ancillary and laboratory) are listed below for reference.     Microbiology: Recent Results (from the past 240 hour(s))  Resp Panel by RT-PCR (Flu A&B, Covid) Nasopharyngeal Swab     Status: None   Collection Time: 11/09/20  1:29 PM   Specimen: Nasopharyngeal Swab; Nasopharyngeal(NP) swabs in vial transport medium  Result Value Ref Range Status   SARS Coronavirus 2 by RT PCR NEGATIVE NEGATIVE Final    Comment: (NOTE) SARS-CoV-2 target nucleic acids are NOT DETECTED.  The SARS-CoV-2 RNA is generally detectable in upper respiratory specimens during the acute phase of infection. The lowest concentration of SARS-CoV-2 viral copies this assay can detect is 138 copies/mL. A negative result does not preclude SARS-Cov-2 infection and should not be used as the sole basis for treatment or other patient management decisions. A negative result may occur with  improper specimen collection/handling, submission of specimen other than nasopharyngeal swab, presence of viral mutation(s) within the areas targeted by this assay, and inadequate number of viral copies(<138  copies/mL). A negative result must be combined with clinical observations, patient history, and epidemiological information. The expected result is Negative.  Fact Sheet for Patients:  EntrepreneurPulse.com.au  Fact Sheet for Healthcare Providers:  IncredibleEmployment.be  This test is no t yet approved or cleared by the [place] FDA and  has been authorized for detection and/or diagnosis of SARS-CoV-2 by FDA under an Emergency Use Authorization (EUA). This EUA will remain  in effect (meaning this test can be used) for the duration of the COVID-19 declaration under Section 564(b)(1) of the Act, 21 U.S.C.section 360bbb-3(b)(1), unless the authorization is terminated  or revoked sooner.       Influenza A by PCR NEGATIVE NEGATIVE Final   Influenza B by PCR NEGATIVE NEGATIVE Final    Comment: (NOTE) The Xpert Xpress SARS-CoV-2/FLU/RSV plus assay is intended as an aid in the diagnosis of influenza from Nasopharyngeal swab specimens and should not be used as a sole basis for treatment. Nasal washings and aspirates are unacceptable for Xpert Xpress SARS-CoV-2/FLU/RSV testing.  Fact Sheet for Patients: EntrepreneurPulse.com.au  Fact Sheet for Healthcare Providers: IncredibleEmployment.be  This test is not yet approved or cleared by the [place] FDA and has been authorized for detection and/or diagnosis of SARS-CoV-2 by FDA under an Emergency Use Authorization (EUA). This EUA will remain in effect (meaning this test can be used) for the duration of the COVID-19 declaration under Section 564(b)(1) of the Act, 21 U.S.C. section 360bbb-3(b)(1), unless the authorization is terminated or revoked.  Performed at [facility], [place] [address] [place], [place] [address]      Labs: BNP (last 3 results) Recent Labs    11/09/20 1328  BNP 662.9*   Basic Metabolic Panel: Recent Labs   Lab 11/09/20 1328 11/09/20 1755 11/10/20 0429 11/10/20 0803 11/11/20 0452  NA 136  --  139  --  138  K 4.9  --  5.3* 5.5* 4.8  CL 96*  --  101  --  99  CO2 32  --  32  --  34*  GLUCOSE 146*  --  158*  --  154*  BUN 23  --  20  --  24*  CREATININE 0.85 1.05* 0.66  --  0.65  CALCIUM 9.6  --  9.0  --  8.8*  MG  --   --   --   --  2.2  PHOS  --   --   --   --  3.0   Liver Function Tests: Recent Labs  Lab 11/10/20 0429  AST 161*  ALT 150*  ALKPHOS 82  BILITOT 0.4  PROT 6.9  ALBUMIN 3.8   No results for input(s): LIPASE, AMYLASE in the last 168 hours. No results for input(s): AMMONIA in the last 168 hours. CBC: Recent Labs  Lab 11/09/20 1328 11/09/20 1755 11/10/20 0429 11/11/20 0452  WBC 10.4 12.2* 9.3 12.4*  NEUTROABS 7.9*  --   --   --   HGB 14.9 14.8 14.2 13.2  HCT 48.4* 48.3* 46.4* 42.6  MCV 99.8 100.2* 99.8 99.1  PLT 276 219 165 194   Cardiac Enzymes: No results for input(s): CKTOTAL, CKMB, CKMBINDEX, TROPONINI in the last 168 hours. BNP: Invalid input(s): POCBNP CBG: Recent Labs  Lab 11/09/20 2138 11/10/20 0732 11/10/20 1136 11/10/20 1626 11/11/20 0749  GLUCAP 304* 154* 162* 167* 157*   D-Dimer Recent Labs    11/09/20 1328  DDIMER 1.02*   Hgb A1c Recent Labs    11/09/20 1755  HGBA1C 6.2*   Lipid Profile No results for input(s): CHOL, HDL, LDLCALC, TRIG, CHOLHDL, LDLDIRECT in the last 72 hours. Thyroid function studies No results for input(s): TSH, T4TOTAL, T3FREE, THYROIDAB in the last 72 hours.  Invalid input(s): FREET3 Anemia work up No results for input(s): VITAMINB12, FOLATE, FERRITIN, TIBC, IRON, RETICCTPCT in the last 72 hours. Urinalysis No results found for: COLORURINE, APPEARANCEUR, LABSPEC, [place], GLUCOSEU, [place], [place], [place], PROTEINUR, UROBILINOGEN, NITRITE, LEUKOCYTESUR Sepsis Labs Invalid input(s): PROCALCITONIN,  WBC,  LACTICIDVEN Microbiology Recent Results (from the past 240 hour(s))  Resp Panel by  RT-PCR (Flu A&B, Covid) Nasopharyngeal Swab     Status: None   Collection Time: 11/09/20  1:29 PM   Specimen: Nasopharyngeal Swab; Nasopharyngeal(NP) swabs in vial transport medium  Result Value Ref Range Status   SARS Coronavirus 2 by RT PCR NEGATIVE NEGATIVE Final    Comment: (NOTE) SARS-CoV-2 target nucleic acids are NOT DETECTED.  The SARS-CoV-2 RNA is generally detectable in upper respiratory specimens during the acute phase of infection. The lowest concentration of SARS-CoV-2 viral copies this assay can detect is 138 copies/mL. A negative result does not preclude SARS-Cov-2 infection and should not be used as the sole basis for treatment or other patient management decisions. A negative result may occur with  improper specimen collection/handling, submission of specimen other than nasopharyngeal swab, presence of viral mutation(s) within the areas targeted by this assay, and inadequate number of viral copies(<138 copies/mL). A negative result must be combined with clinical observations, patient history, and epidemiological information. The expected result is Negative.  Fact Sheet for Patients:  EntrepreneurPulse.com.au  Fact Sheet for Healthcare Providers:  IncredibleEmployment.be  This test is no t yet approved or cleared by the [place] FDA and  has been authorized for detection and/or diagnosis of SARS-CoV-2 by FDA under an Emergency Use Authorization (EUA). This EUA will remain  in effect (meaning this test can be used) for the duration of the COVID-19 declaration under Section 564(b)(1) of the Act, 21 U.S.C.section 360bbb-3(b)(1), unless the authorization is terminated  or revoked sooner.       Influenza A by PCR NEGATIVE NEGATIVE Final   Influenza B by PCR NEGATIVE NEGATIVE Final    Comment: (NOTE) The Xpert Xpress SARS-CoV-2/FLU/RSV plus assay is intended as an aid in the  diagnosis of influenza from Nasopharyngeal swab  specimens and should not be used as a sole basis for treatment. Nasal washings and aspirates are unacceptable for Xpert Xpress SARS-CoV-2/FLU/RSV testing.  Fact Sheet for Patients: EntrepreneurPulse.com.au  Fact Sheet for Healthcare Providers: IncredibleEmployment.be  This test is not yet approved or cleared by the [place] FDA and has been authorized for detection and/or diagnosis of SARS-CoV-2 by FDA under an Emergency Use Authorization (EUA). This EUA will remain in effect (meaning this test can be used) for the duration of the COVID-19 declaration under Section 564(b)(1) of the Act, 21 U.S.C. section 360bbb-3(b)(1), unless the authorization is terminated or revoked.  Performed at [facility], [place] [address] [place], [place] [address]      Time coordinating discharge: Over 30 minutes  SIGNED:   Shawna Clamp, MD  [facility] 11/11/2020, 10:36 AM   If 7PM-7AM, please contact night-coverage [email or web address]

## 2020-11-11 NOTE — Discharge Instructions (Signed)
Advised to follow-up with primary care physician in 1 week. Advised to take prednisone 40 mg daily for 4 days for COPD exacerbation. Advised to take Omnicef 300 mg twice daily for 4 days to complete 5-day treatment for community-acquired pneumonia. Patient is being discharged home on supplemental oxygen 1 L/min.

## 2020-11-12 LAB — HEMOGLOBIN A1C
Hgb A1c MFr Bld: 6.2 % — ABNORMAL HIGH (ref 4.8–5.6)
Mean Plasma Glucose: 131 mg/dL

## 2020-11-17 DIAGNOSIS — R319 Hematuria, unspecified: Secondary | ICD-10-CM | POA: Insufficient documentation

## 2020-11-17 DIAGNOSIS — L900 Lichen sclerosus et atrophicus: Secondary | ICD-10-CM | POA: Insufficient documentation

## 2020-11-18 ENCOUNTER — Ambulatory Visit (INDEPENDENT_AMBULATORY_CARE_PROVIDER_SITE_OTHER): Payer: Medicare Other | Admitting: Internal Medicine

## 2020-11-18 ENCOUNTER — Encounter: Payer: Self-pay | Admitting: Internal Medicine

## 2020-11-18 ENCOUNTER — Other Ambulatory Visit: Payer: Self-pay

## 2020-11-18 VITALS — BP 152/88 | HR 74 | Ht 61.0 in | Wt 195.0 lb

## 2020-11-18 DIAGNOSIS — J449 Chronic obstructive pulmonary disease, unspecified: Secondary | ICD-10-CM | POA: Diagnosis not present

## 2020-11-18 DIAGNOSIS — J9612 Chronic respiratory failure with hypercapnia: Secondary | ICD-10-CM

## 2020-11-18 DIAGNOSIS — J9611 Chronic respiratory failure with hypoxia: Secondary | ICD-10-CM

## 2020-11-18 MED ORDER — TRELEGY ELLIPTA 200-62.5-25 MCG/INH IN AEPB: 1.0000 | INHALATION_SPRAY | Freq: Every day | RESPIRATORY_TRACT | 0 refills | Status: DC

## 2020-11-18 NOTE — Progress Notes (Signed)
Amanda Garrett    [id number]    11-Feb-1956  Primary Care Physician:Elkins, Curt Jews, MD  Referring Physician: Leonard Downing, MD [address] [place],   [address] Reason for Consultation: COPD Date of Consultation: 11/18/2020  Chief complaint:   Chief Complaint  Patient presents with  . Consult  . Hospitalization Follow-up     HPI: Amanda Garrett is a 65 y.o. woman recently admitted for COPD exacerbation in May 2022 at [facility]. Was found to be hypercapnic and hypoxemic. Discharged on prednisone and omnicef, as well as home oxygen.  Here for follow up today.   Was told she had COPD by her PCP PA recently. She gets dyspnea with exertion such as going up steps, walking short distances, bending down to get things.  She has chronic drainage and allergic rhinitis. She takes claritin and singulair which were started in the last year.   She is on twice daily symbicort, albuterol nebulizers which she takes a couple times/week. Symbicort does help. She has been on this for about a year.   She is on propranolol for her A. Fib and baby asa per PCP and cardiologist (saw [facility] cardiology in 2008.)  Social history:  Occupation: Retired from working in [employer].  Exposures: She has two dogs and two cats at home.  Smoking history: 68 pack years  Social History   Occupational History  . Not on file  Tobacco Use  . Smoking status: Former Smoker    Packs/day: 2.00    Years: 34.00    Pack years: 68.00    Types: Cigarettes    Start date: 1965    Quit date: 06/1996    Years since quitting: 24.4  . Smokeless tobacco: Never Used  Substance and Sexual Activity  . Alcohol use: Yes    Comment: occasional  . Drug use: Not on file  . Sexual activity: Not on file    Relevant family history:  Family History  Problem Relation Age of Onset  . Diabetes Mother   . Diabetes Father   . Diabetes Maternal Grandfather   .  Hypertension Maternal Grandfather   . Breast cancer Paternal Grandmother   . COPD Neg Hx   . Lung cancer Neg Hx     Past Medical History:  Diagnosis Date  . Asthma   . GERD (gastroesophageal reflux disease)   . Hypercholesteremia 2011  . Hypertension   . Lichen sclerosus et atrophicus    vulva    Past Surgical History:  Procedure Laterality Date  . [place]  . CHOLECYSTECTOMY    . DILATION AND CURETTAGE, DIAGNOSTIC / THERAPEUTIC  05/2010   hysteroscopy, polypectomy  . KNEE ARTHROSCOPY Right 2013  . REFRACTIVE SURGERY    . TUBAL LIGATION  1990   dr Pearline Cables     Physical Exam: Blood pressure (!) 152/88, pulse 74, height 5\' 1"  (1.549 m), weight 195 lb (88.5 kg), SpO2 97 %. Gen:      No acute distress ENT:  no nasal polyps, mucus membranes moist, on nasal cannula Lungs:    No increased respiratory effort, symmetric chest wall excursion, clear to auscultation bilaterally, diminished bilaterally, no wheezes or crackles CV:         Regular rate and rhythm; no murmurs, rubs, or gallops.  No pedal edema Abd:      Obese,+ bowel sounds; soft, non-tender; no distension MSK: no  acute synovitis of DIP or PIP joints, no mechanics hands.  Skin:      Warm and dry; no rashes Neuro: normal speech, no focal facial asymmetry Psych: alert and oriented x3, normal mood and affect   Data Reviewed/Medical Decision Making:  Independent interpretation of tests: Imaging: . Review of patient's CT Chest May 2022 images revealed upper lobe emphysema, no PE. The patient's images have been independently reviewed by me.    PFTs:  No flowsheet data found.  Labs:  Lab Results  Component Value Date   WBC 12.4 (H) 11/11/2020   HGB 13.2 11/11/2020   HCT 42.6 11/11/2020   MCV 99.1 11/11/2020   PLT 194 11/11/2020   Lab Results  Component Value Date   NA 138 11/11/2020   K 4.8 11/11/2020   CL 99 11/11/2020   CO2 34 (H) 11/11/2020     Immunization status:  Immunization  History  Administered Date(s) Administered  . PFIZER(Purple Top)SARS-COV-2 Vaccination 08/15/2019, 09/11/2019, 03/02/2020    . I reviewed prior external note(s) from hospital stay . I reviewed the result(s) of the labs and imaging as noted above.  . I have ordered PFT   Assessment:  COPD Chronic hypercapnic and hypoxemic respiratory failure History of tobacco use disorder  Plan/Recommendations: Continue symbicort - pause for a week to Sample trelegy. If she likes this better can switch to this. ? Of anaphylaxis to tiotropium so that's why we didn't add spiriva. DME - [facility]. Expecting Small tanks, portable concentrator, large tanks. Continue albuterol prn Will get a full set of PFTs May need BIPAP for hypercapnia, will order after PFTs. Consider pulmonary rehab. Need to make sure up to date on vaccines for pneumonia as well.   We discussed disease management and progression at length today.   Return to Care: Return in about 1 month (around 12/18/2020).  Lenice Llamas, MD Pulmonary and [place]  CC: Leonard Downing, *

## 2020-11-18 NOTE — Patient Instructions (Signed)
The patient should have follow up scheduled with myself in 1 months.   Prior to next visit patient should have: Full set of PFTs - 1 hour, appt with me afterwards  Start trelegy inhaler for a week - stop symbicort. If you like this better, we will switch you to this.  Keep oxygen on for saturations over 88%.  Call me if problems with oxygen tanks.

## 2020-11-24 ENCOUNTER — Other Ambulatory Visit (HOSPITAL_COMMUNITY): Payer: PRIVATE HEALTH INSURANCE

## 2020-11-30 ENCOUNTER — Other Ambulatory Visit (HOSPITAL_COMMUNITY)
Admission: RE | Admit: 2020-11-30 | Discharge: 2020-11-30 | Disposition: A | Discharge status: Home / Self Care | Payer: Medicare Other | Source: Ambulatory Visit | Attending: Internal Medicine | Admitting: Internal Medicine

## 2020-11-30 DIAGNOSIS — Z20822 Contact with and (suspected) exposure to covid-19: Secondary | ICD-10-CM | POA: Insufficient documentation

## 2020-11-30 DIAGNOSIS — Z01812 Encounter for preprocedural laboratory examination: Secondary | ICD-10-CM | POA: Insufficient documentation

## 2020-11-30 LAB — SARS CORONAVIRUS 2 (TAT 6-24 HRS): SARS Coronavirus 2: NEGATIVE

## 2020-12-02 ENCOUNTER — Ambulatory Visit (INDEPENDENT_AMBULATORY_CARE_PROVIDER_SITE_OTHER): Payer: Medicare Other | Admitting: Internal Medicine

## 2020-12-02 ENCOUNTER — Other Ambulatory Visit: Payer: Self-pay

## 2020-12-02 DIAGNOSIS — J9612 Chronic respiratory failure with hypercapnia: Secondary | ICD-10-CM

## 2020-12-02 DIAGNOSIS — J449 Chronic obstructive pulmonary disease, unspecified: Secondary | ICD-10-CM

## 2020-12-02 LAB — PULMONARY FUNCTION TEST
DL/VA % pred: 113 %
DL/VA: 4.85 ml/min/mmHg/L
DLCO cor % pred: 76 %
DLCO cor: 13.65 ml/min/mmHg
DLCO unc % pred: 76 %
DLCO unc: 13.56 ml/min/mmHg
FEF 25-75 Post: 0.39 L/sec
FEF 25-75 Pre: 0.39 L/sec
FEF2575-%Change-Post: -1 %
FEF2575-%Pred-Post: 20 %
FEF2575-%Pred-Pre: 20 %
FEV1-%Change-Post: 6 %
FEV1-%Pred-Post: 35 %
FEV1-%Pred-Pre: 33 %
FEV1-Post: 0.75 L
FEV1-Pre: 0.7 L
FEV1FVC-%Change-Post: 7 %
FEV1FVC-%Pred-Pre: 71 %
FEV6-%Change-Post: 0 %
FEV6-%Pred-Post: 48 %
FEV6-%Pred-Pre: 49 %
FEV6-Post: 1.28 L
FEV6-Pre: 1.29 L
FEV6FVC-%Pred-Post: 104 %
FEV6FVC-%Pred-Pre: 104 %
FVC-%Change-Post: 0 %
FVC-%Pred-Post: 46 %
FVC-%Pred-Pre: 47 %
FVC-Post: 1.28 L
FVC-Pre: 1.29 L
Post FEV1/FVC ratio: 58 %
Post FEV6/FVC ratio: 100 %
Pre FEV1/FVC ratio: 55 %
Pre FEV6/FVC Ratio: 100 %
RV % pred: 139 %
RV: 2.75 L
TLC % pred: 90 %
TLC: 4.19 L

## 2020-12-02 NOTE — Patient Instructions (Signed)
Full PFT performed today. °

## 2020-12-02 NOTE — Progress Notes (Signed)
Full PFT performed today. °

## 2020-12-03 ENCOUNTER — Other Ambulatory Visit: Payer: Self-pay | Admitting: Internal Medicine

## 2020-12-03 MED ORDER — TRELEGY ELLIPTA 200-62.5-25 MCG/INH IN AEPB: 1.0000 | INHALATION_SPRAY | Freq: Every day | RESPIRATORY_TRACT | 5 refills | Status: DC

## 2020-12-22 ENCOUNTER — Other Ambulatory Visit: Payer: Self-pay

## 2020-12-22 ENCOUNTER — Encounter: Payer: Self-pay | Admitting: Internal Medicine

## 2020-12-22 ENCOUNTER — Ambulatory Visit (INDEPENDENT_AMBULATORY_CARE_PROVIDER_SITE_OTHER): Payer: Medicare Other | Admitting: Internal Medicine

## 2020-12-22 VITALS — BP 116/58 | HR 82 | Temp 99.0°F | Resp 16 | Ht 61.5 in | Wt 196.8 lb

## 2020-12-22 DIAGNOSIS — J449 Chronic obstructive pulmonary disease, unspecified: Secondary | ICD-10-CM

## 2020-12-22 DIAGNOSIS — J9611 Chronic respiratory failure with hypoxia: Secondary | ICD-10-CM | POA: Diagnosis not present

## 2020-12-22 DIAGNOSIS — J9612 Chronic respiratory failure with hypercapnia: Secondary | ICD-10-CM | POA: Diagnosis not present

## 2020-12-22 NOTE — Patient Instructions (Signed)
Please schedule follow up scheduled with myself in 3 months.  If my schedule is not open yet, we will contact you with a reminder closer to that time.  I have prescribed portable oxygen concentrator for your DME. I am prescribing bipap as well. Please call me if you haven't heard anything within 2 weeks.  Continue trelegy and albuterol as needed.   Stay active! This is good for your lungs.

## 2020-12-22 NOTE — Progress Notes (Addendum)
Amanda Garrett    [id number]    Feb 20, 1956  Primary Care Physician:Elkins, Curt Jews, MD Date of Appointment: 12/22/2020 Established Patient Visit  Chief complaint:   Chief Complaint  Patient presents with   Follow-up    Pt inquiring about smaller oxygen tank     HPI: Amanda Garrett is a 65 y.o. woman with COPD and chronic bronchitis and chronic respiratory failure.   Interval Updates: Here for follow up after PFTs. Inquiring about POC.  Albuterol use is less than twice a week.  Trelegy daily and it does make her feel better.   I have reviewed the patient's family social and past medical history and updated as appropriate.   Past Medical History:  Diagnosis Date   Asthma    GERD (gastroesophageal reflux disease)    Hypercholesteremia 2011   Hypertension    Lichen sclerosus et atrophicus    vulva    Past Surgical History:  Procedure Laterality Date   CESAREAN SECTION     1988, 1990   CHOLECYSTECTOMY     DILATION AND CURETTAGE, DIAGNOSTIC / THERAPEUTIC  05/2010   hysteroscopy, polypectomy   KNEE ARTHROSCOPY Right 2013   REFRACTIVE SURGERY     TUBAL LIGATION  1990   dr Pearline Cables    Family History  Problem Relation Age of Onset   Diabetes Mother    Diabetes Father    Diabetes Maternal Grandfather    Hypertension Maternal Grandfather    Breast cancer Paternal Grandmother    COPD Neg Hx    Lung cancer Neg Hx     Social History   Occupational History   Not on file  Tobacco Use   Smoking status: Former    Packs/day: 2.00    Years: 34.00    Pack years: 68.00    Types: Cigarettes    Start date: 1965    Quit date: 06/1996    Years since quitting: 24.5   Smokeless tobacco: Never  Substance and Sexual Activity   Alcohol use: Yes    Comment: occasional   Drug use: Not on file   Sexual activity: Not on file     Physical Exam: Blood pressure (!) 116/58, pulse 82, temperature 99 F (37.2 C), temperature source Oral, resp. rate 16, height  5' 1.5" (1.562 m), weight 196 lb 12.8 oz (89.3 kg), SpO2 94 %.  Gen:      No acute distress, chronically ill appearing ENT:  no nasal polyps, mucus membranes moist on nasal cannula Lungs:   diminished, no wheezes or crackles,  CV:         Regular rate and rhythm; no murmurs, rubs, or gallops.  No pedal edema   Data Reviewed: Imaging: I have personally reviewed the CT Angio from May 2022 which shows   PFTs:  PFT Results Latest Ref Rng & Units 12/02/2020  FVC-Pre L 1.29  FVC-Predicted Pre % 47  FVC-Post L 1.28  FVC-Predicted Post % 46  Pre FEV1/FVC % % 55  Post FEV1/FCV % % 58  FEV1-Pre L 0.70  FEV1-Predicted Pre % 33  FEV1-Post L 0.75  DLCO uncorrected ml/min/mmHg 13.56  DLCO UNC% % 76  DLCO corrected ml/min/mmHg 13.65  DLCO COR %Predicted % 76  DLVA Predicted % 113  TLC L 4.19  TLC % Predicted % 90  RV % Predicted % 139   I have personally reviewed the patient's PFTs and they show very severe airflow limitation.   Labs:  ABG shows hypercapnia - PCO2 65 mm Hg  Immunization status: Immunization History  Administered Date(s) Administered   PFIZER(Purple Top)SARS-COV-2 Vaccination 08/15/2019, 09/11/2019, 03/02/2020    Assessment:  Very Severe COPD FEV1 33% of predicted Acute on chronic hypoxemic and hypercapnic respiratory failure  Plan/Recommendations:  Will prescribe POC 1L continuous oxygen Continue trelegy and albuterol.  Will need NIV for COPD.   Patient requires volume ventilation for COPD(dx). All other alternative therapies have been considered and ruled out due to the severity of the disease state and life threatening condition including the retention of CO2 and changes in the patients PFTs'. Due to the probability of acute exacerbation, patient requires ventilation during the day, as needed, in addition to QHS usage with a face mask. Ventilation is required to decrease the work of breathing and improve pulmonary status, which without may lead to serious harm  or death. BIPAP has been considered and ruled out as BIPAP has been proven to be ineffective. The patients respiratory status worsens with BIPAP as it does not allow it to change with the patients tidal volume requirements. Will need trilogy arranged.    Return to Care: Return in about 3 months (around 03/24/2021).   Lenice Llamas, MD Pul[facility] and [place]

## 2020-12-23 ENCOUNTER — Telehealth: Payer: Self-pay | Admitting: Internal Medicine

## 2020-12-24 ENCOUNTER — Ambulatory Visit (INDEPENDENT_AMBULATORY_CARE_PROVIDER_SITE_OTHER): Payer: Medicare Other | Admitting: Gastroenterology

## 2020-12-24 ENCOUNTER — Other Ambulatory Visit (INDEPENDENT_AMBULATORY_CARE_PROVIDER_SITE_OTHER): Payer: Medicare Other

## 2020-12-24 ENCOUNTER — Encounter: Payer: Self-pay | Admitting: Gastroenterology

## 2020-12-24 VITALS — BP 152/96 | HR 72 | Ht 61.5 in | Wt 198.1 lb

## 2020-12-24 DIAGNOSIS — Z1211 Encounter for screening for malignant neoplasm of colon: Secondary | ICD-10-CM

## 2020-12-24 DIAGNOSIS — K219 Gastro-esophageal reflux disease without esophagitis: Secondary | ICD-10-CM

## 2020-12-24 DIAGNOSIS — R195 Other fecal abnormalities: Secondary | ICD-10-CM

## 2020-12-24 DIAGNOSIS — Z9981 Dependence on supplemental oxygen: Secondary | ICD-10-CM

## 2020-12-24 DIAGNOSIS — R748 Abnormal levels of other serum enzymes: Secondary | ICD-10-CM

## 2020-12-24 LAB — HEPATIC FUNCTION PANEL
ALT: 18 U/L (ref 0–35)
AST: 20 U/L (ref 0–37)
Albumin: 4.3 g/dL (ref 3.5–5.2)
Alkaline Phosphatase: 57 U/L (ref 39–117)
Bilirubin, Direct: 0.1 mg/dL (ref 0.0–0.3)
Total Bilirubin: 0.5 mg/dL (ref 0.2–1.2)
Total Protein: 7.1 g/dL (ref 6.0–8.3)

## 2020-12-24 MED ORDER — FAMOTIDINE 20 MG PO TABS: 20.0000 mg | ORAL_TABLET | Freq: Two times a day (BID) | ORAL | 3 refills | Status: DC | PRN

## 2020-12-24 NOTE — Telephone Encounter (Signed)
Spoke with Melissa from [facility] received our order for BIPAP  Since BIPAP order associated with COPD/Resp failure and not OSA- pt will need ONO and ABG both done on o2   Melissa also stated that pt would qualify for NIV without having to have any further testing and her ins will cover 100%   If we want to pursue this, will need note amended to include narrative for NIV need and take out the BIPAP part   Dr [name], please advise on how you would like to proceed, thanks!

## 2020-12-24 NOTE — Telephone Encounter (Signed)
Yes switch to NIV in order. I have addended my note with the statement necessary.

## 2020-12-24 NOTE — Patient Instructions (Addendum)
If you are age 67 or older, your body mass index should be between 23-30. Your Body mass index is 36.83 kg/m. If this is out of the aforementioned range listed, please consider follow up with your Primary Care Provider.  If you are age 66 or younger, your body mass index should be between 19-25. Your Body mass index is 36.83 kg/m. If this is out of the aformentioned range listed, please consider follow up with your Primary Care Provider.   __________________________________________________________  The  GI providers would like to encourage you to use [facility] to communicate with providers for non-urgent requests or questions.  Due to long hold times on the telephone, sending your provider a message by [facility] may be a faster and more efficient way to get a response.  Please allow 48 business hours for a response.  Please remember that this is for non-urgent requests.   Please go to the lab in the basement of our building to have lab work done as you leave today. Hit "B" for basement when you get on the elevator.  When the doors open the lab is on your left.  We will call you with the results. Thank you.  We have sent the following medications to your pharmacy for you to pick up at your convenience: Pepcid 20 mg: Take one daily as needed   Dr. Havery Moros will reach out to Dr. [name], Pulmonology, regarding clearance for a colonoscopy at hospital.   Thank you for entrusting me with your care and for choosing [facility], Dr. Dolores Cellar

## 2020-12-24 NOTE — Progress Notes (Signed)
HPI :  65 year old female with a history of COPD, hypercapnic respiratory failure on supplemental oxygen, GERD, referred by Leonard Downing, MD for heme positive stool consideration of colonoscopy.  She had heme positive stool noted on April 14.  She states her stools may have been "dark" at that time which she normally does not have.  She typically has normal regular bowel habits that are brown in color without blood.  She has usually 1 bowel movement per day.  She previously had been taking ibuprofen for aches and pains but has not taken that for the past year, uses mostly Tylenol as needed or rare aspirin.  She denies any abdominal pains that bother her.  She has no family history of colon cancer.  She has never had a prior colonoscopy.  Her daughter has a history of ulcerative colitis and her mother had diverticulitis.  She has had heartburn for very long time, she states its been worse since she had her gallbladder removed years ago.  She has some what sounds like mild pyrosis most days of the week.  She had used Zantac in the past with benefit before it went off the market and and since then has been using Tums or Rolaids but has nott elped as much.  She has never had an upper endoscopy.  She denies any dysphagia.  She had a barium swallow for dysphagia back in 2005 which showed a small hiatal hernia but no other concerning pathology.  She denies any postprandial nausea or vomiting.  The patient was admitted to the hospital in May with respiratory failure and diagnosed with severe COPD with hypercapnic respiratory failure.  She has been on 1 L of supplemental oxygen, she has had BiPAP ordered for home but that is currently pending.  She states her breathing is currently at baseline and she is feeling okay today.  During that admission she had her liver enzymes checked and had an AST of 161 and an ALT of 150.  She denies any history of liver disease.  She does not drink any alcohol routinely.   She has not had that followed up yet.  09/24/20 (+) FOBT    Past Medical History:  Diagnosis Date   Asthma    COPD (chronic obstructive pulmonary disease) (HCC)    GERD (gastroesophageal reflux disease)    Hypercholesteremia 2011   Hypertension    Lichen sclerosus et atrophicus    vulva     Past Surgical History:  Procedure Laterality Date   CESAREAN SECTION     1988, 1990   CHOLECYSTECTOMY     DILATION AND CURETTAGE, DIAGNOSTIC / THERAPEUTIC  05/2010   hysteroscopy, polypectomy   KNEE ARTHROSCOPY Right 2013   REFRACTIVE SURGERY     TUBAL LIGATION  1990   dr Pearline Cables   Family History  Problem Relation Age of Onset   Diabetes Mother    Diverticulitis Mother    Diabetes Father    Diabetes Maternal Grandfather    Hypertension Maternal Grandfather    Breast cancer Paternal Grandmother    Ulcerative colitis Daughter    COPD Neg Hx    Lung cancer Neg Hx    Colon cancer Neg Hx    Esophageal cancer Neg Hx    Pancreatic cancer Neg Hx    Stomach cancer Neg Hx    Social History   Tobacco Use   Smoking status: Former    Packs/day: 2.00    Years: 34.00    Pack years:  68.00    Types: Cigarettes    Start date: 21    Quit date: 06/1996    Years since quitting: 24.5   Smokeless tobacco: Never  Vaping Use   Vaping Use: Never used  Substance Use Topics   Alcohol use: Yes    Comment: rare   Drug use: Never   Current Outpatient Medications  Medication Sig Dispense Refill   albuterol (PROVENTIL) (2.5 MG/3ML) 0.083% nebulizer solution Take 2.5 mg by nebulization every 6 (six) hours as needed for wheezing or shortness of breath.     atorvastatin (LIPITOR) 10 MG tablet Take 10 mg by mouth daily.     BAYER LOW DOSE 81 MG EC tablet Take 81 mg by mouth daily. Swallow whole.     Calcium-Magnesium-Zinc (CAL-MAG-ZINC PO) Take 1 tablet by mouth daily.     Cholecalciferol (VITAMIN D-3) 25 MCG (1000 UT) CAPS Take 1,000 Units by mouth daily.     clobetasol ointment (TEMOVATE) 7.56 %  Apply 1 application topically daily as needed (in the perineal area).     Fluticasone-Umeclidin-Vilant (TRELEGY ELLIPTA) 200-62.5-25 MCG/INH AEPB Inhale 1 puff into the lungs daily. 1 each 5   loratadine (CLARITIN) 10 MG tablet Take 10 mg by mouth in the morning.     metFORMIN (GLUCOPHAGE) 1000 MG tablet Take 1,000 mg by mouth daily with breakfast.     Misc Natural Products (SAMBUCUS ELDERBERRY IMMUNE) SYRP Take 5 mLs by mouth daily.     montelukast (SINGULAIR) 10 MG tablet Take 10 mg by mouth at bedtime.     Multiple Vitamins-Minerals (AIRBORNE PO) Take 1 tablet by mouth daily as needed.     Multiple Vitamins-Minerals (CENTRUM SILVER ULTRA WOMENS) TABS Take 1 tablet by mouth daily.     NON FORMULARY Elderberry Sinus spray as needed     NON FORMULARY Pure Vegan-D3 +K2 5 drops per day     NON FORMULARY Zinc 10 drops per day     NON FORMULARY Earthly cream at bedtime     nystatin cream (MYCOSTATIN) Apply 1 application topically 2 (two) times daily as needed (to affected areas- for irritation).     olopatadine (PATADAY) 0.1 % ophthalmic solution Place 1 drop into both eyes daily as needed for allergies.     OXYGEN Inhale 1 L into the lungs continuous.     Potassium 99 MG TABS Take 99 mg by mouth daily.     propranolol (INDERAL) 10 MG tablet Take 10 mg by mouth every 12 (twelve) hours.     Sodium Chloride-Sodium Bicarb (AYR SALINE NASAL RINSE NA) Place into the nose as needed.     triamcinolone (KENALOG) 0.025 % cream Apply 1 application topically See admin instructions. Apply to the hands 2 times a day for rashes     TURMERIC PO Take 1 tablet by mouth in the morning and at bedtime. Turmeric Quercetin Bromelain     VENTOLIN HFA 108 (90 Base) MCG/ACT inhaler Inhale 2 puffs into the lungs every 6 (six) hours as needed for wheezing or shortness of breath.     vitamin B-12 (CYANOCOBALAMIN) 500 MCG tablet Take 500 mcg by mouth daily.     vitamin C (ASCORBIC ACID) 500 MG tablet Take 500 mg by mouth  daily.     No current facility-administered medications for this visit.   Allergies  Allergen Reactions   Spiriva Respimat [Tiotropium Bromide Monohydrate] Anaphylaxis   Latex Rash and Other (See Comments)    NO powdered gloves!!  Review of Systems: All systems reviewed and negative except where noted in HPI.    Lab Results  Component Value Date   WBC 12.4 (H) 11/11/2020   HGB 13.2 11/11/2020   HCT 42.6 11/11/2020   MCV 99.1 11/11/2020   PLT 194 11/11/2020    Lab Results  Component Value Date   CREATININE 0.65 11/11/2020   BUN 24 (H) 11/11/2020   NA 138 11/11/2020   K 4.8 11/11/2020   CL 99 11/11/2020   CO2 34 (H) 11/11/2020        Physical Exam: BP (!) 152/96 (BP Location: Left Arm, Patient Position: Sitting, Cuff Size: Large)   Pulse 72   Ht 5' 1.5" (1.562 m)   Wt 198 lb 2 oz (89.9 kg)   SpO2 95%   BMI 36.83 kg/m  Constitutional: Pleasant,female in no acute distress. HEENT: Normocephalic and atraumatic. Conjunctivae are normal. No scleral icterus. Neck supple.  Cardiovascular: Normal rate, regular rhythm.  Pulmonary/chest: Effort normal and breath sounds normal.  Abdominal: Soft, nondistended, nontender. There are no masses palpable.  Extremities: no edema Lymphadenopathy: No cervical adenopathy noted. Neurological: Alert and oriented to person place and time. Skin: Skin is warm and dry. No rashes noted. Psychiatric: Normal mood and affect. Behavior is normal.   ASSESSMENT AND PLAN: 65 year old female here for reassessment of the following:  Heme positive stool History of dark stool Colon cancer screening GERD Elevated liver enzymes Oxygen dependence /COPD  Patient has had no prior colon cancer screening and had heme positive stool in the setting of dark stools a few months ago.  She has had no recurrence of dark stool and is asymptomatic otherwise from her GI tract at this time aside of reflux.  Given her history of heme positive stool,  history of reflux, dark stool, lack of colon cancer screening, I think EGD and colonoscopy is reasonable in the setting to further evaluate and perform her screening.  She is certainly higher than average risk given her recent course with her respiratory status in her case would need to be done at the hospital given her oxygen dependence.  I will reach out to her pulmonologist to ask if she is cleared to undergo anesthesia and have these procedures done or if she would prefer to have more time to improve her pulmonary status.  The patient is agreeable to proceed if she is deemed stable enough for it.  In the interim she has had ongoing mild reflux symptoms that sound poorly controlled, we discussed options we will start her on Pepcid 20 mg a day as needed.  Otherwise we will repeat her LFTs today to see if those remain elevated, also perform hep C screening with that.  Further work-up pending the results.  She agreed with the plan we will follow-up with her once we hear back from her pulmonologist  Plan: - will touch base with her pulmonologist to see if she is optimized from pulmonary standpoint to tolerate anesthesia, of if this should be delayed - EGD and colonoscopy once cleared - start pepcid 20mg  / day PRN for reflux - repeat LFTs, also screen for hep C, further workup if LAEs remain elevated  Jolly Mango, MD [facility]  CC: Leonard Downing, *

## 2020-12-25 LAB — HEPATITIS B SURFACE ANTIGEN: Hepatitis B Surface Ag: NONREACTIVE

## 2020-12-25 LAB — HEPATITIS C ANTIBODY
Hepatitis C Ab: NONREACTIVE
SIGNAL TO CUT-OFF: 0.01 (ref <1.00)

## 2020-12-25 NOTE — Telephone Encounter (Signed)
Called and spoke with Melissa. She is aware that Dr. [name] is ok with the NIV order and has changed her note. She will fax the NIV order form to the triage fax [phone]). Will update once I have received the form and placed in Dr. Mauricio Po sign folder in [place].

## 2020-12-25 NOTE — Telephone Encounter (Signed)
Form has been signed by VS in Dr. Mauricio Po place. Will fax back to [facility].

## 2020-12-25 NOTE — Telephone Encounter (Signed)
I have received the form. Will place in Dr. Mauricio Po sign folder for her to sign once she has returned  to clinic.

## 2020-12-28 ENCOUNTER — Telehealth: Payer: Self-pay

## 2020-12-28 ENCOUNTER — Other Ambulatory Visit: Payer: Self-pay

## 2020-12-28 DIAGNOSIS — R195 Other fecal abnormalities: Secondary | ICD-10-CM

## 2020-12-28 DIAGNOSIS — K219 Gastro-esophageal reflux disease without esophagitis: Secondary | ICD-10-CM

## 2020-12-28 DIAGNOSIS — Z1211 Encounter for screening for malignant neoplasm of colon: Secondary | ICD-10-CM

## 2020-12-28 DIAGNOSIS — Z9981 Dependence on supplemental oxygen: Secondary | ICD-10-CM

## 2020-12-28 NOTE — Progress Notes (Signed)
Letter sent to patient with [facility] and previsit information

## 2020-12-28 NOTE — Progress Notes (Signed)
Amb ref for ECL at [facility] on 9-19

## 2020-12-28 NOTE — Telephone Encounter (Signed)
-----   Message from Yetta Flock, MD sent at 12/25/2020  6:00 PM EDT ----- Regarding: FW: mutual patient Jan can you please contact this patient and let her know I discussed her case with Dr. [name]. I think we can try to get her on the schedule for a EGD and colonoscopy at the hospital in a few months, after she has had some time on the BiPAP, if she is comfortable with that. If I have an open yellow block at the hospital, can you please schedule her in that time frame? Thank you!    ----- Message ----- From: Spero Geralds, MD Sent: 12/25/2020   9:01 AM EDT To: Yetta Flock, MD Subject: RE: mutual patient                             Alfonso Patten, Thanks for reaching out. I think she will likely be on lifelong oxygen at this point, but if it's reasonable to wait from a GI standpoint a few more weeks for her to get her bipap and start using it, i think that would really optimize her and i would feel more confident recommending anesthesia. Of course, if she develops an urgent need for endoscopy in the meant time, we should proceed with that. Would that be ok? Teressa Senter ----- Message ----- From: Yetta Flock, MD Sent: 12/24/2020   6:34 PM EDT To: Spero Geralds, MD Subject: mutual patient                                 Hi there, wanted to touch base about this mutual patient, you see her for severe COPD.  She was referred to see me for heme positive stool and she has never had a prior colonoscopy.  I was curious if you think she is okay to tolerate anesthesia for an EGD or colonoscopy at this time or if she would benefit from more time to recover from her hospitalization in May, I am trying to sort out timing of potential exams for her which would need to be done at the hospital.  Thanks so much for your time  Richardson Landry

## 2020-12-28 NOTE — Telephone Encounter (Signed)
Called and spoke to patient. Offered her Sept 19th or 20th.  Patient prefers Monday, 9-19.  She has been scheduled for an ECL with dr. Havery Moros on 9-19 at 8:15am for heme+stool, dark stool, GERD and Colon cancer screening.  Patient scheduled for a pre visit on Friday, 9-2 at 1:30pm. Patient aware of appts but asked that information be mailed to her. Letter mailed to patient.

## 2021-01-19 ENCOUNTER — Encounter: Payer: Self-pay | Admitting: Gastroenterology

## 2021-02-10 ENCOUNTER — Ambulatory Visit (AMBULATORY_SURGERY_CENTER): Payer: Self-pay

## 2021-02-10 ENCOUNTER — Other Ambulatory Visit: Payer: Self-pay

## 2021-02-10 VITALS — Ht 61.5 in | Wt 200.0 lb

## 2021-02-10 DIAGNOSIS — Z1211 Encounter for screening for malignant neoplasm of colon: Secondary | ICD-10-CM

## 2021-02-10 DIAGNOSIS — Z9981 Dependence on supplemental oxygen: Secondary | ICD-10-CM

## 2021-02-10 DIAGNOSIS — R195 Other fecal abnormalities: Secondary | ICD-10-CM

## 2021-02-10 DIAGNOSIS — K219 Gastro-esophageal reflux disease without esophagitis: Secondary | ICD-10-CM

## 2021-02-10 MED ORDER — NA SULFATE-K SULFATE-MG SULF 17.5-3.13-1.6 GM/177ML PO SOLN: 1.0000 | Freq: Once | ORAL | 0 refills | Status: AC

## 2021-02-10 NOTE — Progress Notes (Signed)
No allergies to soy or egg Pt is not on blood thinners or diet pills Denies issues with sedation/intubation Denies atrial flutter/fib  does have palpitations and on inderal. Denies constipation   Emmi instructions given to pt  Pt is aware of Covid safety and care partner requirements.   Does use O2 and is having procedure at [facility]

## 2021-02-22 ENCOUNTER — Other Ambulatory Visit: Payer: Self-pay

## 2021-02-22 ENCOUNTER — Encounter (HOSPITAL_COMMUNITY): Payer: Self-pay | Admitting: Gastroenterology

## 2021-03-01 ENCOUNTER — Encounter (HOSPITAL_COMMUNITY)
Admission: RE | Disposition: A | Discharge status: Home / Self Care | Payer: Self-pay | Source: Home / Self Care | Attending: Gastroenterology

## 2021-03-01 ENCOUNTER — Ambulatory Visit (HOSPITAL_COMMUNITY): Payer: Medicare Other | Admitting: Anesthesiology

## 2021-03-01 ENCOUNTER — Ambulatory Visit (HOSPITAL_COMMUNITY)
Admission: RE | Admit: 2021-03-01 | Discharge: 2021-03-01 | Disposition: A | Discharge status: Home / Self Care | Payer: Medicare Other | Attending: Gastroenterology | Admitting: Gastroenterology

## 2021-03-01 ENCOUNTER — Other Ambulatory Visit: Payer: Self-pay

## 2021-03-01 ENCOUNTER — Encounter (HOSPITAL_COMMUNITY): Payer: Self-pay | Admitting: Gastroenterology

## 2021-03-01 DIAGNOSIS — Z79899 Other long term (current) drug therapy: Secondary | ICD-10-CM | POA: Diagnosis not present

## 2021-03-01 DIAGNOSIS — D125 Benign neoplasm of sigmoid colon: Secondary | ICD-10-CM | POA: Insufficient documentation

## 2021-03-01 DIAGNOSIS — D126 Benign neoplasm of colon, unspecified: Secondary | ICD-10-CM

## 2021-03-01 DIAGNOSIS — K269 Duodenal ulcer, unspecified as acute or chronic, without hemorrhage or perforation: Secondary | ICD-10-CM

## 2021-03-01 DIAGNOSIS — D123 Benign neoplasm of transverse colon: Secondary | ICD-10-CM | POA: Diagnosis not present

## 2021-03-01 DIAGNOSIS — E7800 Pure hypercholesterolemia, unspecified: Secondary | ICD-10-CM | POA: Diagnosis not present

## 2021-03-01 DIAGNOSIS — K635 Polyp of colon: Secondary | ICD-10-CM | POA: Diagnosis not present

## 2021-03-01 DIAGNOSIS — K5730 Diverticulosis of large intestine without perforation or abscess without bleeding: Secondary | ICD-10-CM | POA: Insufficient documentation

## 2021-03-01 DIAGNOSIS — E119 Type 2 diabetes mellitus without complications: Secondary | ICD-10-CM | POA: Diagnosis not present

## 2021-03-01 DIAGNOSIS — R195 Other fecal abnormalities: Secondary | ICD-10-CM

## 2021-03-01 DIAGNOSIS — K2100 Gastro-esophageal reflux disease with esophagitis, without bleeding: Secondary | ICD-10-CM | POA: Insufficient documentation

## 2021-03-01 DIAGNOSIS — J439 Emphysema, unspecified: Secondary | ICD-10-CM | POA: Insufficient documentation

## 2021-03-01 DIAGNOSIS — Z8249 Family history of ischemic heart disease and other diseases of the circulatory system: Secondary | ICD-10-CM | POA: Diagnosis not present

## 2021-03-01 DIAGNOSIS — Z8379 Family history of other diseases of the digestive system: Secondary | ICD-10-CM | POA: Insufficient documentation

## 2021-03-01 DIAGNOSIS — I10 Essential (primary) hypertension: Secondary | ICD-10-CM | POA: Insufficient documentation

## 2021-03-01 DIAGNOSIS — Z7984 Long term (current) use of oral hypoglycemic drugs: Secondary | ICD-10-CM | POA: Insufficient documentation

## 2021-03-01 DIAGNOSIS — K219 Gastro-esophageal reflux disease without esophagitis: Secondary | ICD-10-CM

## 2021-03-01 DIAGNOSIS — Z9981 Dependence on supplemental oxygen: Secondary | ICD-10-CM

## 2021-03-01 DIAGNOSIS — Z9049 Acquired absence of other specified parts of digestive tract: Secondary | ICD-10-CM | POA: Insufficient documentation

## 2021-03-01 DIAGNOSIS — K449 Diaphragmatic hernia without obstruction or gangrene: Secondary | ICD-10-CM | POA: Insufficient documentation

## 2021-03-01 DIAGNOSIS — K648 Other hemorrhoids: Secondary | ICD-10-CM | POA: Diagnosis not present

## 2021-03-01 DIAGNOSIS — K2970 Gastritis, unspecified, without bleeding: Secondary | ICD-10-CM | POA: Diagnosis not present

## 2021-03-01 DIAGNOSIS — Z91040 Latex allergy status: Secondary | ICD-10-CM | POA: Insufficient documentation

## 2021-03-01 DIAGNOSIS — Z888 Allergy status to other drugs, medicaments and biological substances status: Secondary | ICD-10-CM | POA: Diagnosis not present

## 2021-03-01 DIAGNOSIS — K2990 Gastroduodenitis, unspecified, without bleeding: Secondary | ICD-10-CM

## 2021-03-01 DIAGNOSIS — K3189 Other diseases of stomach and duodenum: Secondary | ICD-10-CM | POA: Insufficient documentation

## 2021-03-01 DIAGNOSIS — Z1211 Encounter for screening for malignant neoplasm of colon: Secondary | ICD-10-CM

## 2021-03-01 DIAGNOSIS — Z87891 Personal history of nicotine dependence: Secondary | ICD-10-CM | POA: Diagnosis not present

## 2021-03-01 DIAGNOSIS — Z833 Family history of diabetes mellitus: Secondary | ICD-10-CM | POA: Insufficient documentation

## 2021-03-01 HISTORY — PX: POLYPECTOMY: SHX5525

## 2021-03-01 HISTORY — PX: HEMOSTASIS CLIP PLACEMENT: SHX6857

## 2021-03-01 HISTORY — PX: ESOPHAGOGASTRODUODENOSCOPY (EGD) WITH PROPOFOL: SHX5813

## 2021-03-01 HISTORY — PX: BIOPSY: SHX5522

## 2021-03-01 HISTORY — PX: COLONOSCOPY WITH PROPOFOL: SHX5780

## 2021-03-01 LAB — GLUCOSE, CAPILLARY: Glucose-Capillary: 123 mg/dL — ABNORMAL HIGH (ref 70–99)

## 2021-03-01 SURGERY — ESOPHAGOGASTRODUODENOSCOPY (EGD) WITH PROPOFOL
Anesthesia: Monitor Anesthesia Care

## 2021-03-01 MED ORDER — PROPOFOL 500 MG/50ML IV EMUL
INTRAVENOUS | Status: AC
  Filled 2021-03-01: qty 50

## 2021-03-01 MED ORDER — PROPOFOL 500 MG/50ML IV EMUL
INTRAVENOUS | Status: DC | PRN
  Administered 2021-03-01: 125 ug/kg/min via INTRAVENOUS

## 2021-03-01 MED ORDER — OMEPRAZOLE 40 MG PO CPDR: 40.0000 mg | DELAYED_RELEASE_CAPSULE | Freq: Every day | ORAL | 3 refills | Status: DC

## 2021-03-01 MED ORDER — SODIUM CHLORIDE 0.9 % IV SOLN: INTRAVENOUS | Status: DC

## 2021-03-01 MED ORDER — LACTATED RINGERS IV SOLN: INTRAVENOUS | Status: DC | PRN

## 2021-03-01 MED ORDER — PROPOFOL 10 MG/ML IV BOLUS
INTRAVENOUS | Status: DC | PRN
  Administered 2021-03-01: 20 mg via INTRAVENOUS

## 2021-03-01 MED ORDER — PROPOFOL 1000 MG/100ML IV EMUL
INTRAVENOUS | Status: AC
  Filled 2021-03-01: qty 100

## 2021-03-01 MED ORDER — ONDANSETRON HCL 4 MG/2ML IJ SOLN
INTRAMUSCULAR | Status: DC | PRN
  Administered 2021-03-01: 4 mg via INTRAVENOUS

## 2021-03-01 MED ORDER — LACTATED RINGERS IV SOLN: INTRAVENOUS | Status: DC

## 2021-03-01 SURGICAL SUPPLY — 25 items

## 2021-03-01 NOTE — Anesthesia Postprocedure Evaluation (Signed)
Anesthesia Post Note  Patient: Amanda Garrett  Procedure(s) Performed: ESOPHAGOGASTRODUODENOSCOPY (EGD) WITH PROPOFOL COLONOSCOPY WITH PROPOFOL BIOPSY POLYPECTOMY HEMOSTASIS CLIP PLACEMENT     Patient location during evaluation: Endoscopy Anesthesia Type: MAC Level of consciousness: awake and alert Pain management: pain level controlled Vital Signs Assessment: post-procedure vital signs reviewed and stable Respiratory status: spontaneous breathing, nonlabored ventilation, respiratory function stable and patient connected to nasal cannula oxygen Cardiovascular status: stable and blood pressure returned to baseline Postop Assessment: no apparent nausea or vomiting Anesthetic complications: no   No notable events documented.  Last Vitals:  Vitals:   03/01/21 0950 03/01/21 1000  BP: (!) 121/91 (!) 127/51  Pulse: 75 69  Resp: 20 19  Temp:    SpO2: 91% 95%    Last Pain:  Vitals:   03/01/21 1000  TempSrc:   PainSc: 0-No pain                 Catalina Gravel

## 2021-03-01 NOTE — Op Note (Signed)
[facility] Patient Name: Amanda Garrett Procedure Date: 03/01/2021 MRN: [id number] Attending MD: Carlota Raspberry. Havery Moros , MD Date of Birth: [birth year] CSN: [id number] Age: 65 Admit Type: Outpatient Procedure:                Upper GI endoscopy Indications:              Follow-up of gastro-esophageal reflux disease, Heme                            positive stool - clear source not found during                            previous colonoscopy Providers:                Carlota Raspberry. Havery Moros, MD, Dulcy Fanny, Tyrone Apple, Technician, Luan Moore, Technician,                            Virgia Land, CRNA Referring MD:              Medicines:                Monitored Anesthesia Care Complications:            No immediate complications. Estimated blood loss:                            Minimal. Estimated Blood Loss:     Estimated blood loss was minimal. Procedure:                Pre-Anesthesia Assessment:                           - Prior to the procedure, a History and Physical                            was performed, and patient medications and                            allergies were reviewed. The patient's tolerance of                            previous anesthesia was also reviewed. The risks                            and benefits of the procedure and the sedation                            options and risks were discussed with the patient.                            All questions were answered, and informed consent                            was obtained. Prior Anticoagulants:  The patient has                            taken no previous anticoagulant or antiplatelet                            agents. ASA Grade Assessment: IV - A patient with                            severe systemic disease that is a constant threat                            to life. After reviewing the risks and benefits,                            the patient  was deemed in satisfactory condition to                            undergo the procedure.                           After obtaining informed consent, the endoscope was                            passed under direct vision. Throughout the                            procedure, the patient's blood pressure, pulse, and                            oxygen saturations were monitored continuously. The                            [id number]-H190 ([id number]) Olympus endoscope was introduced                            through the mouth, and advanced to the second part                            of duodenum. The upper GI endoscopy was                            accomplished without difficulty. The patient                            tolerated the procedure well. Scope In: Scope Out: Findings:      Esophagogastric landmarks were identified: the Z-line was found at 41       cm, the gastroesophageal junction was found at 41 cm and the upper       extent of the gastric folds was found at 43 cm from the incisors.      A 2 cm hiatal hernia was present.      The exam of the esophagus was otherwise normal.      Patchy inflammation characterized by adherent old  blood, erythema and       granularity was found in the gastric body.      The exam of the stomach was otherwise normal.      Biopsies were taken with a cold forceps in the gastric body, at the       incisura and in the gastric antrum for Helicobacter pylori testing.      Two non-bleeding superficial duodenal ulcers with no stigmata of       bleeding were found in the second portion of the duodenum, just proximal       to the ampulla. The largest lesion was 3 mm in largest dimension. Clean       based.      Altered mucosa was were found in the second portion of the duodenum near       the ulcers, did not appear to be on the ampulla but possible adenomatous       change vs. normal variant / reactive. Biopsies were taken with a cold       forceps for histology.       The exam of the duodenum was otherwise normal. Impression:               - Esophagogastric landmarks identified.                           - 2 cm hiatal hernia.                           - Normal esophagus otherwise                           - Gastritis.                           - Normal stomach otherwise - biopsies taken to rule                            out H pylori                           - Non-bleeding 2 superificial duodenal ulcers with                            no stigmata of bleeding.                           - Mucosal changes in the duodenum as outlined -                            could be normal variant but biopsies taken to rule                            out adenomatous change. Biopsied. Moderate Sedation:      No moderate sedation, case performed with MAC Recommendation:           - Patient has a contact number available for                            emergencies. The  signs and symptoms of potential                            delayed complications were discussed with the                            patient. Return to normal activities tomorrow.                            Written discharge instructions were provided to the                            patient.                           - Resume previous diet.                           - Continue present medications.                           - Start omeprazole 45m / day                           - Avoid NSAIDs                           - Await pathology results. Procedure Code(s):        --- Professional ---                           [id number] Esophagogastroduodenoscopy, flexible,                            transoral; with biopsy, single or multiple Diagnosis Code(s):        --- Professional ---                           K44.9, Diaphragmatic hernia without obstruction or                            gangrene                           K29.70, Gastritis, unspecified, without bleeding                           K26.9,  Duodenal ulcer, unspecified as acute or                            chronic, without hemorrhage or perforation                           K31.89, Other diseases of stomach and duodenum                           K21.9, Gastro-esophageal reflux disease without  esophagitis                           R19.5, Other fecal abnormalities                           K92.2, Gastrointestinal hemorrhage, unspecified CPT copyright 2019 [employer]. All rights reserved. The codes documented in this report are preliminary and upon coder review may  be revised to meet current compliance requirements. Remo Lipps P. Adorian Gwynne, MD 03/01/2021 9:42:37 AM This report has been signed electronically. Number of Addenda: 0

## 2021-03-01 NOTE — Anesthesia Preprocedure Evaluation (Addendum)
Anesthesia Evaluation  Patient identified by MRN, date of birth, ID band Patient awake    Reviewed: Allergy & Precautions, NPO status , Patient's Chart, lab work & pertinent test results, reviewed documented beta blocker date and time   Airway Mallampati: III  TM Distance: <3 FB Neck ROM: Full    Dental  (+) Dental Advisory Given, Upper Dentures, Missing,    Pulmonary asthma , COPD,  COPD inhaler and oxygen dependent, former smoker,    Pulmonary exam normal breath sounds clear to auscultation       Cardiovascular hypertension, Pt. on home beta blockers Normal cardiovascular exam Rhythm:Regular Rate:Normal     Neuro/Psych negative neurological ROS     GI/Hepatic Neg liver ROS, GERD  Medicated,heme + stool   Endo/Other  diabetes, Type 2, Oral Hypoglycemic AgentsObesity   Renal/GU negative Renal ROS     Musculoskeletal negative musculoskeletal ROS (+)   Abdominal   Peds  Hematology negative hematology ROS (+)   Anesthesia Other Findings Day of surgery medications reviewed with the patient.  Reproductive/Obstetrics                            Anesthesia Physical Anesthesia Plan  ASA: 4  Anesthesia Plan: MAC   Post-op Pain Management:    Induction: Intravenous  PONV Risk Score and Plan: 2 and Propofol infusion and Treatment may vary due to age or medical condition  Airway Management Planned: Nasal Cannula and Natural Airway  Additional Equipment:   Intra-op Plan:   Post-operative Plan:   Informed Consent: I have reviewed the patients History and Physical, chart, labs and discussed the procedure including the risks, benefits and alternatives for the proposed anesthesia with the patient or authorized representative who has indicated his/her understanding and acceptance.     Dental advisory given  Plan Discussed with: CRNA and Anesthesiologist  Anesthesia Plan Comments:          Anesthesia Quick Evaluation

## 2021-03-01 NOTE — Progress Notes (Signed)
Pt uses O2 @ 2L at home, at all times

## 2021-03-01 NOTE — Anesthesia Procedure Notes (Signed)
Procedure Name: MAC Date/Time: 03/01/2021 8:31 AM Performed by: Niel Hummer, CRNA Pre-anesthesia Checklist: Patient identified, Emergency Drugs available, Suction available and Patient being monitored Oxygen Delivery Method: Simple face mask

## 2021-03-01 NOTE — Op Note (Signed)
[facility] Patient Name: Amanda Garrett Procedure Date: 03/01/2021 MRN: [id number] Attending MD: Carlota Raspberry. Havery Moros , MD Date of Birth: 06/01/1956 CSN: [id number] Age: 65 Admit Type: Outpatient Procedure:                Colonoscopy Indications:              This is the patient's first colonoscopy, Heme                            positive stool Providers:                Remo Lipps P. Havery Moros, MD, Dulcy Fanny, Tyrone Apple, Technician, Luan Moore, Technician,                            Virgia Land, CRNA Referring MD:              Medicines:                Monitored Anesthesia Care Complications:            No immediate complications. Estimated blood loss:                            Minimal. Estimated Blood Loss:     Estimated blood loss was minimal. Procedure:                Pre-Anesthesia Assessment:                           - Prior to the procedure, a History and Physical                            was performed, and patient medications and                            allergies were reviewed. The patient's tolerance of                            previous anesthesia was also reviewed. The risks                            and benefits of the procedure and the sedation                            options and risks were discussed with the patient.                            All questions were answered, and informed consent                            was obtained. Prior Anticoagulants: The patient has                            taken no previous anticoagulant or antiplatelet  agents. ASA Grade Assessment: IV - A patient with                            severe systemic disease that is a constant threat                            to life. After reviewing the risks and benefits,                            the patient was deemed in satisfactory condition to                            undergo the procedure.                            After obtaining informed consent, the colonoscope                            was passed under direct vision. Throughout the                            procedure, the patient's blood pressure, pulse, and                            oxygen saturations were monitored continuously. The                            CF-HQ190L ([id number] Olympus colonoscope was                            introduced through the anus and advanced to the the                            terminal ileum, with identification of the                            appendiceal orifice and IC valve. The colonoscopy                            was performed without difficulty. The patient                            tolerated the procedure well. The quality of the                            bowel preparation was adequate. The terminal ileum,                            ileocecal valve, appendiceal orifice, and rectum                            were photographed. Scope In: 8:52:54 AM Scope Out: 9:26:33 AM Scope Withdrawal Time: 0 hours 29 minutes 6 seconds  Total Procedure Duration: 0 hours  33 minutes 39 seconds  Findings:      The perianal and digital rectal examinations were normal.      The terminal ileum appeared normal.      A 3 to 4 mm polyp was found in the transverse colon. The polyp was       sessile. The polyp was removed with a cold snare. Resection and       retrieval were complete.      A 3 mm polyp was found in the sigmoid colon. The polyp was sessile. The       polyp was removed with a cold snare. Resection and retrieval were       complete.      A 6 to 7 mm polyp was found in the sigmoid colon. The polyp was       pedunculated. The polyp was removed with a hot snare. Ground pad failed       2 times and a third pad needed which worked. This process delayed the       exam a few minutes, Resection and retrieval were complete. Polyp came       off easily but base of it appeared possibly lipomatous. In  this light,       to prevent bleeding after the polypectomy, one hemostatic clip was       successfully placed to reduce bleeding risk.      Many small-mouthed diverticula were found in the sigmoid colon.      Internal hemorrhoids were found during retroflexion.      The exam was otherwise without abnormality. Prep was adequate but       several minutes spent lavaging the colon to achieve adequate views. Impression:               - The examined portion of the ileum was normal.                           - One 3 to 4 mm polyp in the transverse colon,                            removed with a cold snare. Resected and retrieved.                           - One 3 mm polyp in the sigmoid colon, removed with                            a cold snare. Resected and retrieved.                           - One 6 to 7 mm polyp in the sigmoid colon, removed                            with a hot snare. Resected and retrieved. Clip was                            placed prophylactically.                           - Diverticulosis in the sigmoid colon.                           -  Internal hemorrhoids.                           - The examination was otherwise normal.                           No concerning pathology on colonoscopy to cause                            heme positive stool Moderate Sedation:      No moderate sedation, case performed with MAC Recommendation:           - Patient has a contact number available for                            emergencies. The signs and symptoms of potential                            delayed complications were discussed with the                            patient. Return to normal activities tomorrow.                            Written discharge instructions were provided to the                            patient.                           - Resume previous diet.                           - Continue present medications.                           - Await pathology  results. Procedure Code(s):        --- Professional ---                           [phone], Colonoscopy, flexible; with removal of                            tumor(s), polyp(s), or other lesion(s) by snare                            technique Diagnosis Code(s):        --- Professional ---                           K64.8, Other hemorrhoids                           K63.5, Polyp of colon                           R19.5, Other fecal abnormalities  K57.30, Diverticulosis of large intestine without                            perforation or abscess without bleeding CPT copyright 2019 American Medical Association. All rights reserved. The codes documented in this report are preliminary and upon coder review may  be revised to meet current compliance requirements. Remo Lipps P. Dymon Summerhill, MD 03/01/2021 9:35:05 AM This report has been signed electronically. Number of Addenda: 0

## 2021-03-01 NOTE — Discharge Instructions (Signed)
YOU HAD AN ENDOSCOPIC PROCEDURE TODAY: Refer to the procedure report and other information in the discharge instructions given to you for any specific questions about what was found during the examination. If this information does not answer your questions, please call [place] office at [phone] to clarify.  ° °YOU SHOULD EXPECT: Some feelings of bloating in the abdomen. Passage of more gas than usual. Walking can help get rid of the air that was put into your GI tract during the procedure and reduce the bloating. If you had a lower endoscopy (such as a colonoscopy or flexible sigmoidoscopy) you may notice spotting of blood in your stool or on the toilet paper. Some abdominal soreness may be present for a day or two, also. ° °DIET: Your first meal following the procedure should be a light meal and then it is ok to progress to your normal diet. A half-sandwich or bowl of soup is an example of a good first meal. Heavy or fried foods are harder to digest and may make you feel nauseous or bloated. Drink plenty of fluids but you should avoid alcoholic beverages for 24 hours. If you had a esophageal dilation, please see attached instructions for diet.   ° °ACTIVITY: Your care partner should take you home directly after the procedure. You should plan to take it easy, moving slowly for the rest of the day. You can resume normal activity the day after the procedure however YOU SHOULD NOT DRIVE, use power tools, machinery or perform tasks that involve climbing or major physical exertion for 24 hours (because of the sedation medicines used during the test).  ° °SYMPTOMS TO REPORT IMMEDIATELY: °A gastroenterologist can be reached at any hour. Please call [phone]  for any of the following symptoms:  °Following lower endoscopy (colonoscopy, flexible sigmoidoscopy) °Excessive amounts of blood in the stool  °Significant tenderness, worsening of abdominal pains  °Swelling of the abdomen that is new, acute  °Fever of 100° or  higher  °Following upper endoscopy (EGD, EUS, ERCP, esophageal dilation) °Vomiting of blood or coffee ground material  °New, significant abdominal pain  °New, significant chest pain or pain under the shoulder blades  °Painful or persistently difficult swallowing  °New shortness of breath  °Black, tarry-looking or red, bloody stools ° °FOLLOW UP:  °If any biopsies were taken you will be contacted by phone or by letter within the next 1-3 weeks. Call [phone]  if you have not heard about the biopsies in 3 weeks.  °Please also call with any specific questions about appointments or follow up tests. ° °

## 2021-03-01 NOTE — H&P (Addendum)
HPI:   Amanda Garrett is a 65 y.o. female    HPI: Amanda Garrett is a 65 y.o. female  here for colonoscopy screening and endoscopy to evaluate heme positive stool, history of dark stools, history of GERD, no prior colon cancer screening. Patient denies any bowel symptoms at this time. No overt bleeding. She has had no anemia. She does have a history of COPD, she states doing better since the last time I saw her and denies shortness of breath or chest pains today, states he respiratory status is stable and improved compared to previous. Otherwise feels well without any cardiopulmonary symptoms. On supplemental oxygen. I have discussed risks / benefits of endoscopy and anesthesia with the patient - she verbalizes understanding and wishes to proceed.    Past Medical History:  Diagnosis Date   Asthma    COPD (chronic obstructive pulmonary disease) ([place])    Diabetes mellitus without complication (HCC)    Emphysema of lung ([place])    GERD (gastroesophageal reflux disease)    Hypercholesteremia 2011   Hypertension    Lichen sclerosus et atrophicus    vulva   Oxygen deficiency    Palpitation    on Inderal    Past Surgical History:  Procedure Laterality Date   CESAREAN SECTION     1988, 1990   CHOLECYSTECTOMY     DILATION AND CURETTAGE, DIAGNOSTIC / THERAPEUTIC  05/2010   hysteroscopy, polypectomy   KNEE ARTHROSCOPY Right 2013   REFRACTIVE SURGERY     TUBAL LIGATION  1990   dr Pearline Cables    Family History  Problem Relation Age of Onset   Colon polyps Mother    Diabetes Mother    Diverticulitis Mother    Diabetes Father    Diabetes Maternal Grandfather    Hypertension Maternal Grandfather    Breast cancer Paternal Grandmother    Ulcerative colitis Daughter    COPD Neg Hx    Lung cancer Neg Hx    Colon cancer Neg Hx    Esophageal cancer Neg Hx    Pancreatic cancer Neg Hx    Stomach cancer Neg Hx    Rectal cancer Neg Hx      Social History   Tobacco Use   Smoking  status: Former    Packs/day: 2.00    Years: 34.00    Pack years: 68.00    Types: Cigarettes    Start date: 1965    Quit date: 06/1996    Years since quitting: 24.7   Smokeless tobacco: Never  Vaping Use   Vaping Use: Never used  Substance Use Topics   Alcohol use: Yes    Comment: rare   Drug use: Never    Prior to Admission medications   Medication Sig Start Date End Date Taking? Authorizing Provider  acetaminophen (TYLENOL) 500 MG tablet Take 500 mg by mouth every 8 (eight) hours as needed for moderate pain.   Yes [provider]  albuterol (PROVENTIL) (2.5 MG/3ML) 0.083% nebulizer solution Take 2.5 mg by nebulization every 6 (six) hours as needed for wheezing or shortness of breath.   Yes [provider]  Aspirin-Caffeine (ANACIN PO) Take 1 tablet by mouth daily as needed (pain).   Yes [provider]  atorvastatin (LIPITOR) 10 MG tablet Take 10 mg by mouth daily.   Yes [provider]  BAYER LOW DOSE 81 MG EC tablet Take 81 mg by mouth daily. Swallow whole.   Yes [provider]  benzonatate (TESSALON) 200 MG capsule Take 200 mg by mouth 3 (three) times daily as needed for cough.   Yes [provider]  BIOTIN PO Take 1 tablet by mouth daily.   Yes [provider]  Calcium-Magnesium-Zinc (CAL-MAG-ZINC PO) Take 1 tablet by mouth daily.   Yes [provider]  Cholecalciferol (VITAMIN D-3) 25 MCG (1000 UT) CAPS Take 1,000 Units by mouth daily.   Yes [provider]  clobetasol ointment (TEMOVATE) [id number] % Apply 1 application topically daily as needed (in the perineal area).   Yes [provider]  famotidine (PEPCID) 20 MG tablet Take 1 tablet (20 mg total) by mouth 2 (two) times daily as needed for heartburn or indigestion. Patient taking differently: Take 20 mg by mouth at bedtime. 12/24/20  Yes Bingham Millette, Carlota Raspberry, MD  Fluticasone-Umeclidin-Vilant (TRELEGY ELLIPTA) 200-62.5-25 MCG/INH AEPB Inhale 1  puff into the lungs daily. 12/03/20  Yes Spero Geralds, MD  loratadine (CLARITIN) 10 MG tablet Take 10 mg by mouth in the morning.   Yes [provider]  MELATONIN PO Take 5 mg by mouth at bedtime as needed (sleep).   Yes [provider]  metFORMIN (GLUCOPHAGE) 850 MG tablet Take 850 mg by mouth 2 (two) times daily with a meal.   Yes [provider]  Misc Natural Products (SAMBUCUS ELDERBERRY IMMUNE) SYRP Take 5 mLs by mouth daily.   Yes [provider]  montelukast (SINGULAIR) 10 MG tablet Take 10 mg by mouth at bedtime.   Yes [provider]  Multiple Vitamins-Minerals (AIRBORNE PO) Take 1 tablet by mouth daily.   Yes [provider]  Multiple Vitamins-Minerals (CENTRUM SILVER ULTRA WOMENS) TABS Take 1 tablet by mouth daily.   Yes [provider]  NON FORMULARY Pure Vegan-D3 +K2 5 drops per day   Yes [provider]  NON FORMULARY Zinc 10 drops per day   Yes [provider]  NON FORMULARY Apply 1 application topically at bedtime. Earthly cream at bedtime   Yes [provider]  nystatin cream (MYCOSTATIN) Apply 1 application topically 2 (two) times daily as needed (to affected areas- for irritation).   Yes [provider]  olopatadine (PATANOL) 0.1 % ophthalmic solution Place 1 drop into both eyes daily as needed for allergies.   Yes [provider]  OXYGEN Inhale 1 L into the lungs continuous.   Yes [provider]  Potassium 99 MG TABS Take 99 mg by mouth daily.   Yes [provider]  predniSONE (DELTASONE) 20 MG tablet Take 10 mg by mouth every other day.   Yes [provider]  propranolol (INDERAL) 10 MG tablet Take 10 mg by mouth 2 (two) times daily.   Yes [provider]  Sodium Chloride-Sodium Bicarb (AYR SALINE NASAL RINSE NA) Place 1 spray into the nose daily as needed (sinus rinse).   Yes [provider]  triamcinolone (KENALOG) 0.025 %  cream Apply 1 application topically 2 (two) times daily as needed (rash).   Yes [provider]  TURMERIC PO Take 1 tablet by mouth in the morning and at bedtime. Turmeric Quercetin Bromelain   Yes [provider]  VENTOLIN HFA 108 (90 Base) MCG/ACT inhaler Inhale 2 puffs into the lungs every 6 (six) hours as needed for wheezing or shortness of breath.   Yes [provider]  vitamin B-12 (CYANOCOBALAMIN) 500 MCG tablet Take 500 mcg by mouth daily.   Yes [provider]  vitamin  C (ASCORBIC ACID) 500 MG tablet Take 500 mg by mouth daily.   Yes [provider]    Current Facility-Administered Medications  Medication Dose Route Frequency Provider Last Rate Last Admin   0.9 %  sodium chloride infusion   Intravenous Continuous Early Steel, Carlota Raspberry, MD       lactated ringers infusion   Intravenous Continuous Wenda Vanschaick, Carlota Raspberry, MD       Facility-Administered Medications Ordered in Other Encounters  Medication Dose Route Frequency Provider Last Rate Last Admin   lactated ringers infusion   Intravenous Continuous PRN Maxwell Caul, CRNA   New Bag at 03/01/21 0754    Allergies as of 12/28/2020 - Review Complete 12/24/2020  Allergen Reaction Noted   Spiriva respimat [tiotropium bromide monohydrate] Anaphylaxis 11/04/2020   Latex Rash and Other (See Comments) 11/09/2020     Review of Systems:    As per HPI, otherwise negative    Physical Exam:  Vital signs in last 24 hours: Temp:  [97.2 F (36.2 C)] 97.2 F (36.2 C) (09/19 0756) Pulse Rate:  [68] 68 (09/19 0756) Resp:  [18] 18 (09/19 0756) BP: (160)/(77) 160/77 (09/19 0756) SpO2:  [99 %] 99 % (09/19 0756) Weight:  [88.5 kg] 88.5 kg (09/19 0756)   General:   Pleasant female in NAD Lungs:  Respirations even and unlabored. Lungs clear to auscultation bilaterally.    Heart:  Regular rate and rhythm;  Abdomen:  Soft, nondistended, nontender.  Msk:  Symmetrical without gross deformities.   Neurologic:  Alert and  oriented x4;  grossly normal neurologically. Psych:  Alert and cooperative. Normal affect.     Impression / Plan:   Reason for Procedure:   Heme positive stool, dark stools, GERD, colon cancer screening  Plan:    EGD and colonoscopy  I have discussed risks / benefits of endoscopy and anesthesia with the patient - she verbalizes understanding and wishes to proceed.   Jolly Mango, MD [facility] Gastroenter[facility]

## 2021-03-01 NOTE — Transfer of Care (Signed)
Immediate Anesthesia Transfer of Care Note  Patient: Amanda Garrett  Procedure(s) Performed: ESOPHAGOGASTRODUODENOSCOPY (EGD) WITH PROPOFOL COLONOSCOPY WITH PROPOFOL BIOPSY POLYPECTOMY HEMOSTASIS CLIP PLACEMENT  Patient Location: PACU and Endoscopy Unit  Anesthesia Type:MAC  Level of Consciousness: awake, alert  and oriented  Airway & Oxygen Therapy: Patient Spontanous Breathing and Patient connected to face mask oxygen  Post-op Assessment: Report given to RN and Post -op Vital signs reviewed and stable  Post vital signs: Reviewed and stable  Last Vitals:  Vitals Value Taken Time  BP    Temp    Pulse 75 03/01/21 0934  Resp 15 03/01/21 0934  SpO2 97 % 03/01/21 0934  Vitals shown include unvalidated device data.  Last Pain:  Vitals:   03/01/21 0756  TempSrc: Temporal  PainSc: 0-No pain         Complications: No notable events documented.

## 2021-03-02 ENCOUNTER — Encounter (HOSPITAL_COMMUNITY): Payer: Self-pay | Admitting: Gastroenterology

## 2021-03-03 ENCOUNTER — Encounter: Payer: Self-pay | Admitting: Gastroenterology

## 2021-03-03 LAB — SURGICAL PATHOLOGY

## 2021-03-23 ENCOUNTER — Ambulatory Visit: Payer: Medicare Other | Admitting: Internal Medicine

## 2021-04-06 ENCOUNTER — Ambulatory Visit (INDEPENDENT_AMBULATORY_CARE_PROVIDER_SITE_OTHER): Payer: Medicare Other | Admitting: Internal Medicine

## 2021-04-06 ENCOUNTER — Encounter: Payer: Self-pay | Admitting: Internal Medicine

## 2021-04-06 ENCOUNTER — Other Ambulatory Visit: Payer: Self-pay

## 2021-04-06 VITALS — BP 126/78 | HR 86 | Temp 98.1°F | Ht 61.0 in | Wt 198.2 lb

## 2021-04-06 DIAGNOSIS — J9612 Chronic respiratory failure with hypercapnia: Secondary | ICD-10-CM

## 2021-04-06 DIAGNOSIS — J9611 Chronic respiratory failure with hypoxia: Secondary | ICD-10-CM

## 2021-04-06 DIAGNOSIS — J449 Chronic obstructive pulmonary disease, unspecified: Secondary | ICD-10-CM

## 2021-04-06 NOTE — Progress Notes (Signed)
Amanda Garrett    [id number]    Oct 26, 1955  Primary Care Physician:Elkins, Curt Jews, MD Date of Appointment: 04/06/2021 Established Patient Visit  Chief complaint:   Chief Complaint  Patient presents with   Follow-up    Follow up on COPD     HPI: Amanda Garrett is a 65 y.o. woman with COPD and chronic bronchitis and chronic respiratory failure.   Interval Updates: Doing well with NIV at night. Wears every night. Not exercising but has started leaving the house with her husband. Albuterol prn use is minimal. Usually after hot shower. Using trelegy inhaler daily. No fevers chills night sweats.  Had COPD exacerbation a couple months ago requiring antibiotics and prednisone twice. No hospital stay. Treated by PCP as outpatient  Here with her daughter.    Past Medical History:  Diagnosis Date   Asthma    COPD (chronic obstructive pulmonary disease) ([place])    Diabetes mellitus without complication (HCC)    Emphysema of lung ([place])    GERD (gastroesophageal reflux disease)    Hypercholesteremia 2011   Hypertension    Lichen sclerosus et atrophicus    vulva   Oxygen deficiency    Palpitation    on Inderal    Past Surgical History:  Procedure Laterality Date   BIOPSY  03/01/2021   Procedure: BIOPSY;  Surgeon: Yetta Flock, MD;  Location: [facility];  Service: Gastroenterology;;   [place]     COLONOSCOPY WITH PROPOFOL N/A 03/01/2021   Procedure: COLONOSCOPY WITH PROPOFOL;  Surgeon: Yetta Flock, MD;  Location: [facility];  Service: Gastroenterology;  Laterality: N/A;   DILATION AND CURETTAGE, DIAGNOSTIC / THERAPEUTIC  05/2010   hysteroscopy, polypectomy   ESOPHAGOGASTRODUODENOSCOPY (EGD) WITH PROPOFOL N/A 03/01/2021   Procedure: ESOPHAGOGASTRODUODENOSCOPY (EGD) WITH PROPOFOL;  Surgeon: Yetta Flock, MD;  Location: [facility];  Service: Gastroenterology;  Laterality: N/A;   HEMOSTASIS  CLIP PLACEMENT  03/01/2021   Procedure: HEMOSTASIS CLIP PLACEMENT;  Surgeon: Yetta Flock, MD;  Location: [facility];  Service: Gastroenterology;;   KNEE ARTHROSCOPY Right 2013   POLYPECTOMY  03/01/2021   Procedure: POLYPECTOMY;  Surgeon: Yetta Flock, MD;  Location: [facility];  Service: Gastroenterology;;   [place]   dr Pearline Cables    Family History  Problem Relation Age of Onset   Colon polyps Mother    Diabetes Mother    Diverticulitis Mother    Diabetes Father    Diabetes Maternal Grandfather    Hypertension Maternal Grandfather    Breast cancer Paternal Grandmother    Ulcerative colitis Daughter    COPD Neg Hx    Lung cancer Neg Hx    Colon cancer Neg Hx    Esophageal cancer Neg Hx    Pancreatic cancer Neg Hx    Stomach cancer Neg Hx    Rectal cancer Neg Hx     Social History   Occupational History   Not on file  Tobacco Use   Smoking status: Former    Packs/day: 2.00    Years: 34.00    Pack years: 68.00    Types: Cigarettes    Start date: 1965    Quit date: 06/1996    Years since quitting: 24.8   Smokeless tobacco: Never  Vaping Use   Vaping Use: Never used  Substance and Sexual Activity   Alcohol use:  Yes    Comment: rare   Drug use: Never   Sexual activity: Not on file     Physical Exam: Blood pressure 126/78, pulse 86, temperature 98.1 F (36.7 C), temperature source Oral, height 5\' 1"  (1.549 m), weight 198 lb 3.2 oz (89.9 kg), SpO2 95 %.  Gen:     Obese, No acute distress, chronically ill appearing ENT:  mmm on nasal cannula Lungs:   diminished, no wheezes, able to speak in full sentences CV:         RRR no mrg   Data Reviewed: Imaging: I have personally reviewed the CT Angio from May 2022 which shows   PFTs:  PFT Results Latest Ref Rng & Units 12/02/2020  FVC-Pre L 1.29  FVC-Predicted Pre % 47  FVC-Post L 1.28  FVC-Predicted Post % 46  Pre FEV1/FVC % % 55  Post FEV1/FCV % % 58   FEV1-Pre L 0.70  FEV1-Predicted Pre % 33  FEV1-Post L 0.75  DLCO uncorrected ml/min/mmHg 13.56  DLCO UNC% % 76  DLCO corrected ml/min/mmHg 13.65  DLCO COR %Predicted % 76  DLVA Predicted % 113  TLC L 4.19  TLC % Predicted % 90  RV % Predicted % 139   I have personally reviewed the patient's PFTs and they show very severe airflow limitation.   Labs: ABG shows hypercapnia - PCO2 65 mm Hg  Immunization status: Immunization History  Administered Date(s) Administered   PFIZER(Purple Top)SARS-COV-2 Vaccination 08/15/2019, 09/11/2019, 03/02/2020    Assessment:  Very Severe COPD FEV1 33% of predicted Acute on chronic hypoxemic and hypercapnic respiratory failure  Plan/Recommendations:  Continue trelegy, prn albuterol Continue nocturnal NIV Continue home oxygen. Will refer to pulmonary rehab.    Return to Care: Return in about 6 months (around 10/05/2021).   Lenice Llamas, MD Pulmonary and [place]

## 2021-04-06 NOTE — Patient Instructions (Signed)
Please schedule follow up scheduled with myself in 6 months.  If my schedule is not open yet, we will contact you with a reminder closer to that time.  I am referring you to pulmonary rehab. They will call to schedule for you.  Glad you're doing well! Please call me if any issues - happy to help.

## 2021-04-14 ENCOUNTER — Telehealth (HOSPITAL_COMMUNITY): Payer: Self-pay

## 2021-04-14 NOTE — Telephone Encounter (Signed)
Called patient to see if she is interested in the Pulmonary Rehab Program. Patient expressed interest. Explained scheduling process, patient verbalized understanding. Also adv pt where we are with scheduling for PR and that we have a backlog of 1-4 months. 

## 2021-05-02 ENCOUNTER — Encounter (HOSPITAL_COMMUNITY): Payer: Self-pay | Admitting: *Deleted

## 2021-05-02 NOTE — Progress Notes (Signed)
Received referral from Dr. [name] for this pt to participate in pulmonary rehab with the diagnosis of COPD Stage 3.  Pt Full PFT show FEV1/FVC 58 and FEV1 post pred 35 11/2020.Clinical review of pt follow up appt on 10/25 Pulmonary office note.  Pt with Covid Risk Score - 3. Pt appropriate for scheduling for Pulmonary rehab.  Will forward to support staff for scheduling  when able as there is a wait list and verification of insurance eligibility/benefits with pt consent. Cherre Huger, BSN Cardiac and Training and development officer

## 2021-06-17 ENCOUNTER — Other Ambulatory Visit: Payer: Self-pay | Admitting: Internal Medicine

## 2021-07-01 ENCOUNTER — Telehealth (HOSPITAL_COMMUNITY): Payer: Self-pay

## 2021-07-01 NOTE — Telephone Encounter (Signed)
Pt insurance is active and benefits verified through Medicare a/b Co-pay 0, DED $226/0 met, out of pocket $0.00/0 met, co-insurance 20%. no pre-authorization required   2ndary insurance is active and benefits verified through [place]. Co-pay 0, DED 0/0 met, out of pocket 0/0 met, co-insurance 0%. No pre-authorization required

## 2021-07-14 ENCOUNTER — Other Ambulatory Visit: Payer: Self-pay | Admitting: Internal Medicine

## 2021-07-21 ENCOUNTER — Telehealth (HOSPITAL_COMMUNITY): Payer: Self-pay

## 2021-07-21 NOTE — Telephone Encounter (Signed)
Tried to call Amanda Garrett to confirm her orientation. She did not answer and her voicemail was not set up for me to leave a message.

## 2021-07-21 NOTE — Telephone Encounter (Signed)
Amanda Garrett called me back to confirm her orientation. Instructed her to wear a closed toed, closed back shoe and COVID screening. Directed her on how to find the department and department number.

## 2021-07-22 ENCOUNTER — Other Ambulatory Visit: Payer: Self-pay | Admitting: Gastroenterology

## 2021-07-23 ENCOUNTER — Encounter (HOSPITAL_COMMUNITY)
Admission: RE | Admit: 2021-07-23 | Discharge: 2021-07-23 | Disposition: A | Discharge status: Home / Self Care | Payer: Medicare Other | Source: Ambulatory Visit | Attending: Internal Medicine | Admitting: Internal Medicine

## 2021-07-23 ENCOUNTER — Other Ambulatory Visit: Payer: Self-pay

## 2021-07-23 ENCOUNTER — Encounter (HOSPITAL_COMMUNITY): Payer: Self-pay

## 2021-07-23 VITALS — BP 158/70 | HR 76 | Ht 61.0 in | Wt 202.6 lb

## 2021-07-23 DIAGNOSIS — E119 Type 2 diabetes mellitus without complications: Secondary | ICD-10-CM | POA: Insufficient documentation

## 2021-07-23 DIAGNOSIS — Z5189 Encounter for other specified aftercare: Secondary | ICD-10-CM | POA: Insufficient documentation

## 2021-07-23 DIAGNOSIS — J439 Emphysema, unspecified: Secondary | ICD-10-CM | POA: Insufficient documentation

## 2021-07-23 DIAGNOSIS — J449 Chronic obstructive pulmonary disease, unspecified: Secondary | ICD-10-CM

## 2021-07-23 NOTE — Progress Notes (Signed)
Pulmonary Rehab Orientation Physical Assessment Note  Physical assessment reveals  Pt is alert and oriented x 3.  Heart rate is normal, breath sounds diminished throughout - tight sounding. Reports non-productive cough. Bowel sounds present.  Pt denies abdominal discomfort, nausea, vomiting or diarrhea. Grip strength equal, strong. Distal pulses palpable; no swelling to lower extremities. Cherre Huger, BSN Cardiac and Training and development officer

## 2021-07-23 NOTE — Progress Notes (Signed)
Amanda Garrett 66 y.o. female  Initial Psychosocial Assessment  Pt psychosocial assessment reveals pt lives with their spouse. Pt is currently working part time as a Product manager at [employer]. Sharina states that her supervisor is accomodates her because of her pulmonary disease. Pt hobbies include spending time with others and dancing. Efrat enjoys seeing her family as she states that they are her support system. Pt reports her stress level is moderate. Belanna states that she can become stressed over anything but she tries to remain positive. She feels hindered by her pulmonary disease but she does what she can and knows how to manage her symptoms.  Pt does not exhibit signs of depression. Pt shows good  coping skills with positive outlook . Offered emotional support and reassurance. Will continue to monitor.    07/23/2021 3:30 PM

## 2021-07-23 NOTE — Progress Notes (Signed)
Amanda Garrett 66 y.o. female Pulmonary Rehab Orientation Note This patient who was referred to Pulmonary Rehab by Dr. Zada Finders with the diagnosis of COPD Stage 3 arrived today in Cardiac and Pulmonary Rehab. She  arrived with ambulatory normal gait. She  does carry portable oxygen. Amanda Garrett does not know the DME for her oxygen. Per pt, Amanda Garrett uses oxygen continuously. Color good, skin warm and dry. Patient is oriented to time and place. Patient's medical history, psychosocial health, and medications reviewed. Psychosocial assessment reveals pt lives with spouse. 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 Pt hobbies include  dancing . Pt reports her stress level is moderate. Amanda Garrett stated that she can become stressed about anything but tries to stay positive. Pt does not exhibit signs of depression. PHQ2/9 score 2/4. Amanda Garrett shows good  coping skills with positive outlook on life. Offered emotional support and reassurance. Will continue to monitor. Physical assessment performed by Maurice Small, RN. Please see her note. Amanda Garrett reports she does take medications as prescribed. Patient states she follows a Diabetic diet. The patient has been trying to lose weight through a healthy diet and exercise program.. Pt's weight will be monitored closely. Demonstration and practice of PLB using pulse oximeter. [name] able to return demonstration satisfactorily. Safety and hand hygiene in the exercise area reviewed with patient. Amanda Garrett voices understanding of the information reviewed. Department expectations discussed with patient and achievable goals were set. The patient shows enthusiasm about attending the program and we look forward to working with Amanda Garrett. Amanda Garrett completed a 6 min walk test today and is scheduled to begin exercise on 07-27-21 at  1:15pm.  [id number] [place]

## 2021-07-23 NOTE — Progress Notes (Signed)
Pulmonary Individual Treatment Plan  Patient Details  Name: Amanda Garrett MRN: [id number] Date of Birth: 16-Nov-1955 Referring Provider:   [name] Pulmonary Rehab Walk Test from 07/23/2021 in [place]  Referring Provider [name]       Initial Encounter Date:  Flowsheet Row Pulmonary Rehab Walk Test from 07/23/2021 in [place]  Date 07/23/21       Visit Diagnosis: Stage 3 severe COPD by GOLD classification ([place])  Patient's Home Medications on Admission:   Current Outpatient Medications:    acetaminophen (TYLENOL) 500 MG tablet, Take 500 mg by mouth every 8 (eight) hours as needed for moderate pain., Disp: , Rfl:    albuterol (PROVENTIL) (2.5 MG/3ML) 0.083% nebulizer solution, Take 2.5 mg by nebulization every 6 (six) hours as needed for wheezing or shortness of breath., Disp: , Rfl:    Aspirin-Caffeine (ANACIN PO), Take 1 tablet by mouth daily as needed (pain)., Disp: , Rfl:    atorvastatin (LIPITOR) 10 MG tablet, Take 10 mg by mouth daily., Disp: , Rfl:    BAYER LOW DOSE 81 MG EC tablet, Take 81 mg by mouth daily. Swallow whole., Disp: , Rfl:    benzonatate (TESSALON) 200 MG capsule, Take 200 mg by mouth 3 (three) times daily as needed for cough., Disp: , Rfl:    BIOTIN PO, Take 1 tablet by mouth daily., Disp: , Rfl:    Calcium-Magnesium-Zinc (CAL-MAG-ZINC PO), Take 1 tablet by mouth daily., Disp: , Rfl:    Cholecalciferol (VITAMIN D-3) 25 MCG (1000 UT) CAPS, Take 1,000 Units by mouth daily., Disp: , Rfl:    clobetasol ointment (TEMOVATE) 8.18 %, Apply 1 application topically daily as needed (in the perineal area)., Disp: , Rfl:    famotidine (PEPCID) 20 MG tablet, Take 1 tablet (20 mg total) by mouth 2 (two) times daily as needed for heartburn or indigestion. (Patient taking differently: Take 20 mg by mouth at bedtime.), Disp: 90 tablet, Rfl: 3   Fluticasone-Umeclidin-Vilant (TRELEGY ELLIPTA) 200-62.5-25 MCG/ACT  AEPB, INHALE 1 PUFF ONCE DAILY, Disp: 60 each, Rfl: 2   loratadine (CLARITIN) 10 MG tablet, Take 10 mg by mouth in the morning., Disp: , Rfl:    MELATONIN PO, Take 5 mg by mouth at bedtime as needed (sleep)., Disp: , Rfl:    metFORMIN (GLUCOPHAGE) 850 MG tablet, Take 850 mg by mouth 2 (two) times daily with a meal., Disp: , Rfl:    Misc Natural Products (SAMBUCUS ELDERBERRY IMMUNE) SYRP, Take 5 mLs by mouth daily., Disp: , Rfl:    montelukast (SINGULAIR) 10 MG tablet, Take 10 mg by mouth at bedtime., Disp: , Rfl:    Multiple Vitamins-Minerals (AIRBORNE PO), Take 1 tablet by mouth daily., Disp: , Rfl:    Multiple Vitamins-Minerals (CENTRUM SILVER ULTRA WOMENS) TABS, Take 1 tablet by mouth daily., Disp: , Rfl:    NON FORMULARY, Pure Vegan-D3 +K2 5 drops per day, Disp: , Rfl:    NON FORMULARY, Zinc 10 drops per day, Disp: , Rfl:    NON FORMULARY, Apply 1 application topically at bedtime. Earthly cream at bedtime, Disp: , Rfl:    nystatin cream (MYCOSTATIN), Apply 1 application topically 2 (two) times daily as needed (to affected areas- for irritation)., Disp: , Rfl:    olopatadine (PATANOL) 0.1 % ophthalmic solution, Place 1 drop into both eyes daily as needed for allergies., Disp: , Rfl:    omeprazole (PRILOSEC) 40 MG capsule, Take 1 capsule by mouth once  daily, Disp: 30 capsule, Rfl: 0   OXYGEN, Inhale 1 L into the lungs continuous., Disp: , Rfl:    Potassium 99 MG TABS, Take 99 mg by mouth daily., Disp: , Rfl:    predniSONE (DELTASONE) 20 MG tablet, Take 10 mg by mouth every other day., Disp: , Rfl:    propranolol (INDERAL) 10 MG tablet, Take 10 mg by mouth 2 (two) times daily., Disp: , Rfl:    Sodium Chloride-Sodium Bicarb (AYR SALINE NASAL RINSE NA), Place 1 spray into the nose daily as needed (sinus rinse)., Disp: , Rfl:    terbinafine (LAMISIL) 1 % cream, Apply 1 application topically 2 (two) times daily. As needed, Disp: , Rfl:    triamcinolone (KENALOG) 0.025 % cream, Apply 1 application  topically 2 (two) times daily as needed (rash)., Disp: , Rfl:    TURMERIC PO, Take 1 tablet by mouth in the morning and at bedtime. Turmeric Quercetin Bromelain, Disp: , Rfl:    VENTOLIN HFA 108 (90 Base) MCG/ACT inhaler, Inhale 2 puffs into the lungs every 6 (six) hours as needed for wheezing or shortness of breath., Disp: , Rfl:    vitamin B-12 (CYANOCOBALAMIN) 500 MCG tablet, Take 500 mcg by mouth daily., Disp: , Rfl:    vitamin C (ASCORBIC ACID) 500 MG tablet, Take 500 mg by mouth daily., Disp: , Rfl:   Past Medical History: Past Medical History:  Diagnosis Date   Asthma    COPD (chronic obstructive pulmonary disease) ([place])    Diabetes mellitus without complication (HCC)    Emphysema of lung ([place])    GERD (gastroesophageal reflux disease)    Hypercholesteremia 2011   Hypertension    Lichen sclerosus et atrophicus    vulva   Oxygen deficiency    Palpitation    on Inderal    Tobacco Use: Social History   Tobacco Use  Smoking Status Former   Packs/day: 2.00   Years: 34.00   Pack years: 68.00   Types: Cigarettes   Start date: 35   Quit date: 06/1996   Years since quitting: 25.1  Smokeless Tobacco Never    Labs: Recent Review Scientist, physiological     Labs for ITP Cardiac and Pulmonary Rehab Latest Ref Rng & Units 11/09/2020 11/09/2020 11/09/2020 11/11/2020   Hemoglobin A1c 4.8 - 5.6 % - 6.2(H) - 6.2(H)   PHART 7.350 - 7.450 7.251(L) - 7.268(L) -   PCO2ART 32.0 - 48.0 mmHg 76.6(HH) - 65.3(HH) -   HCO3 20.0 - 28.0 mmol/L 32.5(H) - 28.9(H) -   O2SAT % 90.9 - 93.4 -       Capillary Blood Glucose: Lab Results  Component Value Date   GLUCAP 123 (H) 03/01/2021   GLUCAP 157 (H) 11/11/2020   GLUCAP 167 (H) 11/10/2020   GLUCAP 162 (H) 11/10/2020   GLUCAP 154 (H) 11/10/2020     Pulmonary Assessment Scores:  Pulmonary Assessment Scores     Row Name 07/23/21 1357         ADL UCSD   ADL Phase Entry     SOB Score total 63       CAT Score   CAT Score 16       mMRC  Score   mMRC Score 1             UCSD: Self-administered rating of dyspnea associated with activities of daily living (ADLs) 6-point scale (0 = "not at all" to 5 = "maximal or unable to do because of breathlessness")  Scoring  Scores range from 0 to 120.  Minimally important difference is 5 units  CAT: CAT can identify the health impairment of COPD patients and is better correlated with disease progression.  CAT has a scoring range of zero to 40. The CAT score is classified into four groups of low (less than 10), medium (10 - 20), high (21-30) and very high (31-40) based on the impact level of disease on health status. A CAT score over 10 suggests significant symptoms.  A worsening CAT score could be explained by an exacerbation, poor medication adherence, poor inhaler technique, or progression of COPD or comorbid conditions.  CAT MCID is 2 points  mMRC: mMRC (Modified Medical Research Council) Dyspnea Scale is used to assess the degree of baseline functional disability in patients of respiratory disease due to dyspnea. No minimal important difference is established. A decrease in score of 1 point or greater is considered a positive change.   Pulmonary Function Assessment:  Pulmonary Function Assessment - 07/23/21 1357       Breath   Shortness of Breath Fear of Shortness of Breath;Limiting activity;Panic with Shortness of Breath;Yes             Exercise Target Goals: Exercise Program Goal: Individual exercise prescription set using results from initial 6 min walk test and THRR while considering  patients activity barriers and safety.   Exercise Prescription Goal: Initial exercise prescription builds to 30-45 minutes a day of aerobic activity, 2-3 days per week.  Home exercise guidelines will be given to patient during program as part of exercise prescription that the participant will acknowledge.  Activity Barriers & Risk Stratification:  Activity Barriers & Cardiac Risk  Stratification - 07/23/21 1350       Activity Barriers & Cardiac Risk Stratification   Activity Barriers Arthritis;Deconditioning;Muscular Weakness;Shortness of Breath;History of Falls    Cardiac Risk Stratification Low             6 Minute Walk:  6 Minute Walk     Row Name 07/23/21 1451         6 Minute Walk   Phase Initial     Distance 850 feet     Walk Time 6 minutes     # of Rest Breaks 1  4:00-4:47     MPH 1.61     METS 2.02     RPE 12     Perceived Dyspnea  1     VO2 Peak 7.06     Symptoms No     Resting HR 69 bpm     Resting BP 158/70     Resting Oxygen Saturation  97 %     Exercise Oxygen Saturation  during 6 min walk 87 %     Max Ex. HR 111 bpm     Max Ex. BP 170/88     2 Minute Post BP 150/80       Interval HR   1 Minute HR 88     2 Minute HR 100     3 Minute HR 108     4 Minute HR 111     5 Minute HR 99     6 Minute HR 107     2 Minute Post HR 90     Interval Heart Rate? Yes       Interval Oxygen   Interval Oxygen? Yes     Baseline Oxygen Saturation % 97 %     1 Minute Oxygen Saturation % 94 %  1 Minute Liters of Oxygen 1 L     2 Minute Oxygen Saturation % 90 %     2 Minute Liters of Oxygen 1 L     3 Minute Oxygen Saturation % 89 %  3:50: 87%     3 Minute Liters of Oxygen 1 L  Increased to 2L     4 Minute Oxygen Saturation % 88 %     4 Minute Liters of Oxygen 2 L     5 Minute Oxygen Saturation % 95 %     5 Minute Liters of Oxygen 2 L     6 Minute Oxygen Saturation % 94 %     6 Minute Liters of Oxygen 2 L     2 Minute Post Oxygen Saturation % 97 %     2 Minute Post Liters of Oxygen 2 L              Oxygen Initial Assessment:  Oxygen Initial Assessment - 07/23/21 1346       Home Oxygen   Home Oxygen Device E-Tanks;Home Concentrator    Sleep Oxygen Prescription Continuous    Liters per minute 1    Home Exercise Oxygen Prescription Continuous    Liters per minute 1    Home Resting Oxygen Prescription Continuous    Liters  per minute 1    Compliance with Home Oxygen Use Yes      Initial 6 min Walk   Oxygen Used Continuous    Liters per minute 2      Program Oxygen Prescription   Program Oxygen Prescription Continuous    Liters per minute 2      Intervention   Short Term Goals To learn and exhibit compliance with exercise, home and travel O2 prescription;To learn and understand importance of monitoring SPO2 with pulse oximeter and demonstrate accurate use of the pulse oximeter.;To learn and understand importance of maintaining oxygen saturations>88%;To learn and demonstrate proper pursed lip breathing techniques or other breathing techniques. ;To learn and demonstrate proper use of respiratory medications    Long  Term Goals Exhibits compliance with exercise, home  and travel O2 prescription;Verbalizes importance of monitoring SPO2 with pulse oximeter and return demonstration;Maintenance of O2 saturations>88%;Exhibits proper breathing techniques, such as pursed lip breathing or other method taught during program session;Compliance with respiratory medication;Demonstrates proper use of MDIs             Oxygen Re-Evaluation:   Oxygen Discharge (Final Oxygen Re-Evaluation):   Initial Exercise Prescription:  Initial Exercise Prescription - 07/23/21 1400       Date of Initial Exercise RX and Referring Provider   Date 07/23/21    Referring Provider [name]    Expected Discharge Date 09/23/21      Oxygen   Oxygen Continuous    Liters 2    Maintain Oxygen Saturation 88% or higher      NuStep   Level 1    SPM 60    Minutes 30      Prescription Details   Frequency (times per week) 2    Duration Progress to 30 minutes of continuous aerobic without signs/symptoms of physical distress      Intensity   THRR 40-80% of Max Heartrate 61-122    Ratings of Perceived Exertion 11-13    Perceived Dyspnea 0-4      Progression   Progression Continue to progress workloads to maintain intensity without  signs/symptoms of physical distress.      Resistance Training  Training Prescription Yes    Weight red bands    Reps 10-15             Perform Capillary Blood Glucose checks as needed.  Exercise Prescription Changes:   Exercise Comments:   Exercise Goals and Review:   Exercise Goals     Row Name 07/23/21 1455             Exercise Goals   Increase Physical Activity Yes       Intervention Provide advice, education, support and counseling about physical activity/exercise needs.;Develop an individualized exercise prescription for aerobic and resistive training based on initial evaluation findings, risk stratification, comorbidities and participant's personal goals.       Expected Outcomes Short Term: Attend rehab on a regular basis to increase amount of physical activity.;Long Term: Add in home exercise to make exercise part of routine and to increase amount of physical activity.;Long Term: Exercising regularly at least 3-5 days a week.       Increase Strength and Stamina Yes       Intervention Provide advice, education, support and counseling about physical activity/exercise needs.;Develop an individualized exercise prescription for aerobic and resistive training based on initial evaluation findings, risk stratification, comorbidities and participant's personal goals.       Expected Outcomes Short Term: Perform resistance training exercises routinely during rehab and add in resistance training at home;Short Term: Increase workloads from initial exercise prescription for resistance, speed, and METs.;Long Term: Improve cardiorespiratory fitness, muscular endurance and strength as measured by increased METs and functional capacity (6MWT)       Able to understand and use rate of perceived exertion (RPE) scale Yes       Intervention Provide education and explanation on how to use RPE scale       Expected Outcomes Short Term: Able to use RPE daily in rehab to express subjective  intensity level;Long Term:  Able to use RPE to guide intensity level when exercising independently       Able to understand and use Dyspnea scale Yes       Intervention Provide education and explanation on how to use Dyspnea scale       Expected Outcomes Short Term: Able to use Dyspnea scale daily in rehab to express subjective sense of shortness of breath during exertion;Long Term: Able to use Dyspnea scale to guide intensity level when exercising independently       Knowledge and understanding of Target Heart Rate Range (THRR) Yes       Intervention Provide education and explanation of THRR including how the numbers were predicted and where they are located for reference       Expected Outcomes Short Term: Able to state/look up THRR;Long Term: Able to use THRR to govern intensity when exercising independently;Short Term: Able to use daily as guideline for intensity in rehab       Understanding of Exercise Prescription Yes       Intervention Provide education, explanation, and written materials on patient's individual exercise prescription       Expected Outcomes Short Term: Able to explain program exercise prescription;Long Term: Able to explain home exercise prescription to exercise independently                Exercise Goals Re-Evaluation :   Discharge Exercise Prescription (Final Exercise Prescription Changes):   Nutrition:  Target Goals: Understanding of nutrition guidelines, daily intake of sodium 1500mg , cholesterol 200mg , calories 30% from fat and 7% or less from saturated  fats, daily to have 5 or more servings of fruits and vegetables.  Biometrics:  Pre Biometrics - 07/23/21 1313       Pre Biometrics   Grip Strength 17 kg              Nutrition Therapy Plan and Nutrition Goals:   Nutrition Assessments:  MEDIFICTS Score Key: ?70 Need to make dietary changes  40-70 Heart Healthy Diet ? 40 Therapeutic Level Cholesterol Diet   Picture Your Plate Scores: <27  Unhealthy dietary pattern with much room for improvement. 41-50 Dietary pattern unlikely to meet recommendations for good health and room for improvement. 51-60 More healthful dietary pattern, with some room for improvement.  >60 Healthy dietary pattern, although there may be some specific behaviors that could be improved.    Nutrition Goals Re-Evaluation:   Nutrition Goals Discharge (Final Nutrition Goals Re-Evaluation):   Psychosocial: Target Goals: Acknowledge presence or absence of significant depression and/or stress, maximize coping skills, provide positive support system. Participant is able to verbalize types and ability to use techniques and skills needed for reducing stress and depression.  Initial Review & Psychosocial Screening:  Initial Psych Review & Screening - 07/23/21 1336       Initial Review   Current issues with None Identified      Family Dynamics   Good Support System? Yes    Comments family      Barriers   Psychosocial barriers to participate in program There are no identifiable barriers or psychosocial needs.      Screening Interventions   Interventions Encouraged to exercise             Quality of Life Scores:  Scores of 19 and below usually indicate a poorer quality of life in these areas.  A difference of  2-3 points is a clinically meaningful difference.  A difference of 2-3 points in the total score of the Quality of Life Index has been associated with significant improvement in overall quality of life, self-image, physical symptoms, and general health in studies assessing change in quality of life.  PHQ-9: Recent Review Flowsheet Data     Depression screen [facility] 2/9 07/23/2021   Decreased Interest 1   Down, Depressed, Hopeless 1   PHQ - 2 Score 2   Altered sleeping 1    Tired, decreased energy 1   Change in appetite 0   Feeling bad or failure about yourself  0   Trouble concentrating 0   Moving slowly or fidgety/restless 0   Suicidal  thoughts 0   PHQ-9 Score 4   Difficult doing work/chores Somewhat difficult      Interpretation of Total Score  Total Score Depression Severity:  1-4 = Minimal depression, 5-9 = Mild depression, 10-14 = Moderate depression, 15-19 = Moderately severe depression, 20-27 = Severe depression   Psychosocial Evaluation and Intervention:  Psychosocial Evaluation - 07/23/21 1339       Psychosocial Evaluation & Interventions   Interventions Encouraged to exercise with the program and follow exercise prescription    Expected Outcomes For [place] to participate in Pulmonary Rehab    Continue Psychosocial Services  Follow up required by staff             Psychosocial Re-Evaluation:   Psychosocial Discharge (Final Psychosocial Re-Evaluation):   Education: Education Goals: Education classes will be provided on a weekly basis, covering required topics. Participant will state understanding/return demonstration of topics presented.  Learning Barriers/Preferences:  Learning Barriers/Preferences - 07/23/21 1344  Learning Barriers/Preferences   Learning Barriers None    Learning Preferences Individual Instruction;Computer/Internet;Pictoral;Written Material;Video;Verbal Instruction             Education Topics: Risk Factor Reduction:  -Group instruction that is supported by a PowerPoint presentation. Instructor discusses the definition of a risk factor, different risk factors for pulmonary disease, and how the heart and lungs work together.     Nutrition for Pulmonary Patient:  -Group instruction provided by PowerPoint slides, verbal discussion, and written materials to support subject matter. The instructor gives an explanation and review of healthy diet recommendations, which includes a discussion on weight management, recommendations for fruit and vegetable consumption, as well as protein, fluid, caffeine, fiber, sodium, sugar, and alcohol. Tips for eating when patients are  short of breath are discussed.   Pursed Lip Breathing:  -Group instruction that is supported by demonstration and informational handouts. Instructor discusses the benefits of pursed lip and diaphragmatic breathing and detailed demonstration on how to preform both.     Oxygen Safety:  -Group instruction provided by PowerPoint, verbal discussion, and written material to support subject matter. There is an overview of What is Oxygen and Why do we need it.  Instructor also reviews how to create a safe environment for oxygen use, the importance of using oxygen as prescribed, and the risks of noncompliance. There is a brief discussion on traveling with oxygen and resources the patient may utilize.   Oxygen Equipment:  -Group instruction provided by [facility] Staff utilizing handouts, written materials, and equipment demonstrations.   Signs and Symptoms:  -Group instruction provided by written material and verbal discussion to support subject matter. Warning signs and symptoms of infection, stroke, and heart attack are reviewed and when to call the physician/911 reinforced. Tips for preventing the spread of infection discussed.   Advanced Directives:  -Group instruction provided by verbal instruction and written material to support subject matter. Instructor reviews Advanced Directive laws and proper instruction for filling out document.   Pulmonary Video:  -Group video education that reviews the importance of medication and oxygen compliance, exercise, good nutrition, pulmonary hygiene, and pursed lip and diaphragmatic breathing for the pulmonary patient.   Exercise for the Pulmonary Patient:  -Group instruction that is supported by a PowerPoint presentation. Instructor discusses benefits of exercise, core components of exercise, frequency, duration, and intensity of an exercise routine, importance of utilizing pulse oximetry during exercise, safety while exercising, and options of places  to exercise outside of rehab.     Pulmonary Medications:  -Verbally interactive group education provided by instructor with focus on inhaled medications and proper administration.   Anatomy and Physiology of the Respiratory System and Intimacy:  -Group instruction provided by PowerPoint, verbal discussion, and written material to support subject matter. Instructor reviews respiratory cycle and anatomical components of the respiratory system and their functions. Instructor also reviews differences in obstructive and restrictive respiratory diseases with examples of each. Intimacy, Sex, and Sexuality differences are reviewed with a discussion on how relationships can change when diagnosed with pulmonary disease. Common sexual concerns are reviewed.   MD DAY -A group question and answer session with a medical doctor that allows participants to ask questions that relate to their pulmonary disease state.   OTHER EDUCATION -Group or individual verbal, written, or video instructions that support the educational goals of the pulmonary rehab program.   Holiday Eating Survival Tips:  -Group instruction provided by PowerPoint slides, verbal discussion, and written materials to support subject matter. The instructor  gives patients tips, tricks, and techniques to help them not only survive but enjoy the holidays despite the onslaught of food that accompanies the holidays.   Knowledge Questionnaire Score:  Knowledge Questionnaire Score - 07/23/21 1357       Knowledge Questionnaire Score   Pre Score 15/18             Core Components/Risk Factors/Patient Goals at Admission:  Personal Goals and Risk Factors at Admission - 07/23/21 1340       Core Components/Risk Factors/Patient Goals on Admission    Weight Management Weight Loss;Yes    Intervention Weight Management: Provide education and appropriate resources to help participant work on and attain dietary goals.;Weight Management/Obesity:  Establish reasonable short term and long term weight goals.;Weight Management: Develop a combined nutrition and exercise program designed to reach desired caloric intake, while maintaining appropriate intake of nutrient and fiber, sodium and fats, and appropriate energy expenditure required for the weight goal.;Obesity: Provide education and appropriate resources to help participant work on and attain dietary goals.    Expected Outcomes Weight Loss: Understanding of general recommendations for a balanced deficit meal plan, which promotes 1-2 lb weight loss per week and includes a negative energy balance of [phone] kcal/d;Short Term: Continue to assess and modify interventions until short term weight is achieved;Long Term: Adherence to nutrition and physical activity/exercise program aimed toward attainment of established weight goal;Weight Maintenance: Understanding of the daily nutrition guidelines, which includes 25-35% calories from fat, 7% or less cal from saturated fats, less than 200mg  cholesterol, less than 1.5gm of sodium, & 5 or more servings of fruits and vegetables daily;Understanding recommendations for meals to include 15-35% energy as protein, 25-35% energy from fat, 35-60% energy from carbohydrates, less than 200mg  of dietary cholesterol, 20-35 gm of total fiber daily;Understanding of distribution of calorie intake throughout the day with the consumption of 4-5 meals/snacks;Weight Gain: Understanding of general recommendations for a high calorie, high protein meal plan that promotes weight gain by distributing calorie intake throughout the day with the consumption for 4-5 meals, snacks, and/or supplements    Improve shortness of breath with ADL's Yes    Intervention Provide education, individualized exercise plan and daily activity instruction to help decrease symptoms of SOB with activities of daily living.    Expected Outcomes Short Term: Improve cardiorespiratory fitness to achieve a reduction  of symptoms when performing ADLs;Long Term: Be able to perform more ADLs without symptoms or delay the onset of symptoms    Increase knowledge of respiratory medications and ability to use respiratory devices properly  Yes    Intervention Provide education and demonstration as needed of appropriate use of medications, inhalers, and oxygen therapy.    Expected Outcomes Short Term: Achieves understanding of medications use. Understands that oxygen is a medication prescribed by physician. Demonstrates appropriate use of inhaler and oxygen therapy.;Long Term: Maintain appropriate use of medications, inhalers, and oxygen therapy.             Core Components/Risk Factors/Patient Goals Review:    Core Components/Risk Factors/Patient Goals at Discharge (Final Review):    ITP Comments:  Dr. Rodman Pickle is Medical Director for Pulmonary Rehab at [facility].

## 2021-07-27 ENCOUNTER — Other Ambulatory Visit: Payer: Self-pay

## 2021-07-27 ENCOUNTER — Encounter (HOSPITAL_COMMUNITY)
Admission: RE | Admit: 2021-07-27 | Discharge: 2021-07-27 | Disposition: A | Discharge status: Home / Self Care | Payer: Medicare Other | Source: Ambulatory Visit | Attending: Internal Medicine | Admitting: Internal Medicine

## 2021-07-27 DIAGNOSIS — J449 Chronic obstructive pulmonary disease, unspecified: Secondary | ICD-10-CM

## 2021-07-27 DIAGNOSIS — Z5189 Encounter for other specified aftercare: Secondary | ICD-10-CM | POA: Diagnosis not present

## 2021-07-27 LAB — GLUCOSE, CAPILLARY: Glucose-Capillary: 122 mg/dL — ABNORMAL HIGH (ref 70–99)

## 2021-07-27 NOTE — Progress Notes (Signed)
Daily Session Note  Patient Details  Name: Amanda Garrett MRN: [id number] Date of Birth: December 27, 1955 Referring Provider:   [name] Pulmonary Rehab Walk Test from 07/23/2021 in [place]  Referring Provider [name]       Encounter Date: 07/27/2021  Check In:  Session Check In - 07/27/21 1542       Check-In   Supervising physician immediately available to respond to emergencies [facility] immediately available    Physician(s) Dr. Eliseo Squires    Location [facility]    Staff Present Elmon Else, MS, ACSM-CEP, Exercise Physiologist;David Lilyan Punt, MS, ACSM-CEP, CCRP, Exercise Physiologist;Lisa Ysidro Evert, RN    Virtual Visit No    Medication changes reported     No    Fall or balance concerns reported    No    Tobacco Cessation No Change    Warm-up and Cool-down Performed as group-led instruction    Resistance Training Performed Yes    VAD Patient? No    PAD/SET Patient? No      Pain Assessment   Currently in Pain? No/denies    Multiple Pain Sites No             Capillary Blood Glucose: Results for orders placed or performed during the hospital encounter of 07/27/21 (from the past 24 hour(s))  Glucose, capillary     Status: Abnormal   Collection Time: 07/27/21  2:39 PM  Result Value Ref Range   Glucose-Capillary 122 (H) 70 - 99 mg/dL      Social History   Tobacco Use  Smoking Status Former   Packs/day: 2.00   Years: 34.00   Pack years: 68.00   Types: Cigarettes   Start date: 1965   Quit date: 06/1996   Years since quitting: 25.1  Smokeless Tobacco Never    Goals Met:  Proper associated with RPD/PD & O2 Sat Exercise tolerated well No report of concerns or symptoms today Strength training completed today  Goals Unmet:  Not Applicable  Comments: Service time is from 1321 to 1445.    Dr. Rodman Pickle is Medical Director for Pulmonary Rehab at [facility]

## 2021-07-28 LAB — GLUCOSE, CAPILLARY: Glucose-Capillary: 103 mg/dL — ABNORMAL HIGH (ref 70–99)

## 2021-07-29 ENCOUNTER — Encounter (HOSPITAL_COMMUNITY)
Admission: RE | Admit: 2021-07-29 | Discharge: 2021-07-29 | Disposition: A | Discharge status: Home / Self Care | Payer: Medicare Other | Source: Ambulatory Visit | Attending: Internal Medicine | Admitting: Internal Medicine

## 2021-07-29 ENCOUNTER — Other Ambulatory Visit: Payer: Self-pay

## 2021-07-29 VITALS — Wt 204.4 lb

## 2021-07-29 DIAGNOSIS — Z5189 Encounter for other specified aftercare: Secondary | ICD-10-CM | POA: Diagnosis not present

## 2021-07-29 DIAGNOSIS — J449 Chronic obstructive pulmonary disease, unspecified: Secondary | ICD-10-CM

## 2021-07-29 LAB — GLUCOSE, CAPILLARY
Glucose-Capillary: 89 mg/dL (ref 70–99)
Glucose-Capillary: 86 mg/dL (ref 70–99)

## 2021-07-29 NOTE — Progress Notes (Signed)
Daily Session Note  Patient Details  Name: Amanda Garrett MRN: [id number] Date of Birth: July 14, 1955 Referring Provider:   April Garrett Pulmonary Rehab Walk Test from 07/23/2021 in [place]  Referring Provider Amanda Garrett       Encounter Date: 07/29/2021  Check In:  Session Check In - 07/29/21 1445       Check-In   Supervising physician immediately available to respond to emergencies [facility] immediately available    Physician(s) Dr. Cruzita Lederer    Location [facility]    Staff Present Rosebud Poles, RN, BSN;Ramon Dredge, RN, Fernande Bras, MS, ACSM-CEP, Exercise Physiologist    Virtual Visit No    Medication changes reported     No    Fall or balance concerns reported    No    Tobacco Cessation No Change    Warm-up and Cool-down Performed as group-led instruction    Resistance Training Performed Yes    VAD Patient? No    PAD/SET Patient? No      Pain Assessment   Currently in Pain? No/denies    Multiple Pain Sites No             Capillary Blood Glucose: Results for orders placed or performed during the hospital encounter of 07/29/21 (from the past 24 hour(s))  Glucose, capillary     Status: None   Collection Time: 07/29/21  2:07 PM  Result Value Ref Range   Glucose-Capillary 86 70 - 99 mg/dL      Social History   Tobacco Use  Smoking Status Former   Packs/day: 2.00   Years: 34.00   Pack years: 68.00   Types: Cigarettes   Start date: 1965   Quit date: 06/1996   Years since quitting: 25.1  Smokeless Tobacco Never    Goals Met:  Exercise tolerated well No report of concerns or symptoms today Strength training completed today  Goals Unmet:  Not Applicable  Comments: Service time is from 1315 to 1430.    Dr. Rodman Pickle is Medical Director for Pulmonary Rehab at [facility].

## 2021-08-03 ENCOUNTER — Encounter (HOSPITAL_COMMUNITY): Payer: Medicare Other

## 2021-08-05 ENCOUNTER — Inpatient Hospital Stay (HOSPITAL_COMMUNITY)
Admission: EM | Admit: 2021-08-05 | Discharge: 2021-08-08 | DRG: 190 | Disposition: A | Discharge status: Home / Self Care | Payer: Medicare Other | Attending: Internal Medicine | Admitting: Internal Medicine

## 2021-08-05 ENCOUNTER — Encounter (HOSPITAL_COMMUNITY): Payer: Medicare Other

## 2021-08-05 ENCOUNTER — Encounter (HOSPITAL_COMMUNITY): Payer: Self-pay | Admitting: Emergency Medicine

## 2021-08-05 ENCOUNTER — Telehealth: Payer: Self-pay | Admitting: Internal Medicine

## 2021-08-05 ENCOUNTER — Emergency Department (HOSPITAL_COMMUNITY): Payer: Medicare Other

## 2021-08-05 DIAGNOSIS — Z91048 Other nonmedicinal substance allergy status: Secondary | ICD-10-CM

## 2021-08-05 DIAGNOSIS — J441 Chronic obstructive pulmonary disease with (acute) exacerbation: Secondary | ICD-10-CM | POA: Diagnosis present

## 2021-08-05 DIAGNOSIS — Z7952 Long term (current) use of systemic steroids: Secondary | ICD-10-CM

## 2021-08-05 DIAGNOSIS — Z20822 Contact with and (suspected) exposure to covid-19: Secondary | ICD-10-CM | POA: Diagnosis present

## 2021-08-05 DIAGNOSIS — E7800 Pure hypercholesterolemia, unspecified: Secondary | ICD-10-CM | POA: Diagnosis present

## 2021-08-05 DIAGNOSIS — Z87891 Personal history of nicotine dependence: Secondary | ICD-10-CM | POA: Diagnosis not present

## 2021-08-05 DIAGNOSIS — Z888 Allergy status to other drugs, medicaments and biological substances status: Secondary | ICD-10-CM | POA: Diagnosis not present

## 2021-08-05 DIAGNOSIS — E119 Type 2 diabetes mellitus without complications: Secondary | ICD-10-CM | POA: Diagnosis present

## 2021-08-05 DIAGNOSIS — J9621 Acute and chronic respiratory failure with hypoxia: Secondary | ICD-10-CM | POA: Diagnosis present

## 2021-08-05 DIAGNOSIS — L900 Lichen sclerosus et atrophicus: Secondary | ICD-10-CM | POA: Diagnosis present

## 2021-08-05 DIAGNOSIS — Z7984 Long term (current) use of oral hypoglycemic drugs: Secondary | ICD-10-CM | POA: Diagnosis not present

## 2021-08-05 DIAGNOSIS — K219 Gastro-esophageal reflux disease without esophagitis: Secondary | ICD-10-CM | POA: Diagnosis present

## 2021-08-05 DIAGNOSIS — Z8249 Family history of ischemic heart disease and other diseases of the circulatory system: Secondary | ICD-10-CM | POA: Diagnosis not present

## 2021-08-05 DIAGNOSIS — Z79899 Other long term (current) drug therapy: Secondary | ICD-10-CM

## 2021-08-05 DIAGNOSIS — J439 Emphysema, unspecified: Secondary | ICD-10-CM | POA: Diagnosis present

## 2021-08-05 DIAGNOSIS — I10 Essential (primary) hypertension: Secondary | ICD-10-CM | POA: Diagnosis present

## 2021-08-05 DIAGNOSIS — Z7951 Long term (current) use of inhaled steroids: Secondary | ICD-10-CM | POA: Diagnosis not present

## 2021-08-05 DIAGNOSIS — J9611 Chronic respiratory failure with hypoxia: Secondary | ICD-10-CM

## 2021-08-05 DIAGNOSIS — R911 Solitary pulmonary nodule: Secondary | ICD-10-CM | POA: Diagnosis present

## 2021-08-05 DIAGNOSIS — Z87892 Personal history of anaphylaxis: Secondary | ICD-10-CM

## 2021-08-05 DIAGNOSIS — Z7982 Long term (current) use of aspirin: Secondary | ICD-10-CM

## 2021-08-05 DIAGNOSIS — Z91040 Latex allergy status: Secondary | ICD-10-CM

## 2021-08-05 DIAGNOSIS — Z9981 Dependence on supplemental oxygen: Secondary | ICD-10-CM

## 2021-08-05 DIAGNOSIS — Z6838 Body mass index (BMI) 38.0-38.9, adult: Secondary | ICD-10-CM | POA: Diagnosis not present

## 2021-08-05 DIAGNOSIS — Z833 Family history of diabetes mellitus: Secondary | ICD-10-CM

## 2021-08-05 DIAGNOSIS — Z9851 Tubal ligation status: Secondary | ICD-10-CM | POA: Diagnosis not present

## 2021-08-05 LAB — HEPATIC FUNCTION PANEL
ALT: 22 U/L (ref 0–44)
AST: 35 U/L (ref 15–41)
Albumin: 4 g/dL (ref 3.5–5.0)
Alkaline Phosphatase: 61 U/L (ref 38–126)
Bilirubin, Direct: 0.2 mg/dL (ref 0.0–0.2)
Indirect Bilirubin: 0.8 mg/dL (ref 0.3–0.9)
Total Bilirubin: 1 mg/dL (ref 0.3–1.2)
Total Protein: 7.7 g/dL (ref 6.5–8.1)

## 2021-08-05 LAB — CBC
HCT: 40.6 % (ref 36.0–46.0)
Hemoglobin: 13.2 g/dL (ref 12.0–15.0)
MCH: 30 pg (ref 26.0–34.0)
MCHC: 32.5 g/dL (ref 30.0–36.0)
MCV: 92.3 fL (ref 80.0–100.0)
Platelets: 254 10*3/uL (ref 150–400)
RBC: 4.4 MIL/uL (ref 3.87–5.11)
RDW: 13.8 % (ref 11.5–15.5)
WBC: 9.4 10*3/uL (ref 4.0–10.5)
nRBC: 0 % (ref 0.0–0.2)

## 2021-08-05 LAB — BASIC METABOLIC PANEL
Anion gap: 11 (ref 5–15)
BUN: 19 mg/dL (ref 8–23)
CO2: 26 mmol/L (ref 22–32)
Calcium: 9.1 mg/dL (ref 8.9–10.3)
Chloride: 99 mmol/L (ref 98–111)
Creatinine, Ser: 0.66 mg/dL (ref 0.44–1.00)
GFR, Estimated: >60 mL/min (ref >60)
Glucose, Bld: 162 mg/dL — ABNORMAL HIGH (ref 70–99)
Potassium: 4.8 mmol/L (ref 3.5–5.1)
Sodium: 136 mmol/L (ref 135–145)

## 2021-08-05 LAB — RESP PANEL BY RT-PCR (FLU A&B, COVID) ARPGX2
Influenza A by PCR: NEGATIVE
Influenza B by PCR: NEGATIVE
SARS Coronavirus 2 by RT PCR: NEGATIVE

## 2021-08-05 LAB — LIPASE, BLOOD: Lipase: 34 U/L (ref 11–51)

## 2021-08-05 LAB — TROPONIN I (HIGH SENSITIVITY)
Troponin I (High Sensitivity): 4 ng/L (ref <18)
Troponin I (High Sensitivity): 5 ng/L (ref <18)

## 2021-08-05 LAB — BRAIN NATRIURETIC PEPTIDE: B Natriuretic Peptide: 96.9 pg/mL (ref 0.0–100.0)

## 2021-08-05 MED ORDER — ALBUTEROL SULFATE HFA 108 (90 BASE) MCG/ACT IN AERS: 2.0000 | INHALATION_SPRAY | RESPIRATORY_TRACT | Status: DC | PRN

## 2021-08-05 MED ORDER — MAGNESIUM SULFATE 2 GM/50ML IV SOLN
2.0000 g | Freq: Once | INTRAVENOUS | Status: AC
  Administered 2021-08-05: 2 g via INTRAVENOUS
  Filled 2021-08-05: qty 50

## 2021-08-05 NOTE — ED Triage Notes (Signed)
Sent by PCP for SOB. Reports being seen by a MD 3 days ago and getting put on prednisone. Today when she got up she was very SOB w/ coughing. Baseline on 1 L O2 via [employer], is on 3 L currently.   EMS reports diminished lung sounds. Got 5 albuterol via Neb, got 125 Solumedrol IV.   Vitals  180/84 NSR 95  RR 20

## 2021-08-05 NOTE — Telephone Encounter (Signed)
Spoke with the pt  She is c/o cough with thick, yellow sputum, SOB, wheezing- started on abx and pred taper  She states that this has not helped her symptoms  Yesterday after a bad coughing spell her sats dropped into the mid 80's  I have scheduled her to see Dr [name] tomorrow at 3:15 pm, advised to seek emergent care sooner if needed

## 2021-08-05 NOTE — ED Provider Notes (Signed)
[facility] DEPT Provider Note   CSN: [id number] Arrival date & time: 08/05/21  1801     History  Chief Complaint  Patient presents with   Shortness of Breath    Amanda Garrett is a 66 y.o. female.  The history is provided by the patient and medical records. No language interpreter was used.  Shortness of Breath Severity:  Severe Onset quality:  Gradual Duration:  4 days Timing:  Constant Progression:  Unchanged Chronicity:  New Context: URI and weather changes   Relieved by:  Nothing Worsened by:  Nothing Ineffective treatments:  Inhaler and oxygen Associated symptoms: chest pain, cough, fever (at home) and sputum production   Associated symptoms: no abdominal pain, no diaphoresis, no headaches, no neck pain, no rash, no vomiting and no wheezing   Risk factors: no hx of PE/DVT       Home Medications Prior to Admission medications   Medication Sig Start Date End Date Taking? Authorizing Provider  acetaminophen (TYLENOL) 500 MG tablet Take 500 mg by mouth every 8 (eight) hours as needed for moderate pain.    [provider]  albuterol (PROVENTIL) (2.5 MG/3ML) 0.083% nebulizer solution Take 2.5 mg by nebulization every 6 (six) hours as needed for wheezing or shortness of breath.    [provider]  Aspirin-Caffeine (ANACIN PO) Take 1 tablet by mouth daily as needed (pain).    [provider]  atorvastatin (LIPITOR) 10 MG tablet Take 10 mg by mouth daily.    [provider]  BAYER LOW DOSE 81 MG EC tablet Take 81 mg by mouth daily. Swallow whole.    [provider]  benzonatate (TESSALON) 200 MG capsule Take 200 mg by mouth 3 (three) times daily as needed for cough.    [provider]  BIOTIN PO Take 1 tablet by mouth daily.    [provider]  Calcium-Magnesium-Zinc (CAL-MAG-ZINC PO) Take 1 tablet by mouth daily.    [provider]  Cholecalciferol (VITAMIN D-3) 25 MCG  (1000 UT) CAPS Take 1,000 Units by mouth daily.    [provider]  clobetasol ointment (TEMOVATE) 6.97 % Apply 1 application topically daily as needed (in the perineal area).    [provider]  famotidine (PEPCID) 20 MG tablet Take 1 tablet (20 mg total) by mouth 2 (two) times daily as needed for heartburn or indigestion. Patient taking differently: Take 20 mg by mouth at bedtime. 12/24/20   Armbruster, Carlota Raspberry, MD  Fluticasone-Umeclidin-Vilant (TRELEGY ELLIPTA) 200-62.5-25 MCG/ACT AEPB INHALE 1 PUFF ONCE DAILY 07/14/21   Spero Geralds, MD  loratadine (CLARITIN) 10 MG tablet Take 10 mg by mouth in the morning.    [provider]  MELATONIN PO Take 5 mg by mouth at bedtime as needed (sleep).    [provider]  metFORMIN (GLUCOPHAGE) 850 MG tablet Take 850 mg by mouth 2 (two) times daily with a meal.    [provider]  Misc Natural Products (SAMBUCUS ELDERBERRY IMMUNE) SYRP Take 5 mLs by mouth daily.    [provider]  montelukast (SINGULAIR) 10 MG tablet Take 10 mg by mouth at bedtime.    [provider]  Multiple Vitamins-Minerals (AIRBORNE PO) Take 1 tablet by mouth daily.    [provider]  Multiple Vitamins-Minerals (CENTRUM SILVER ULTRA WOMENS) TABS Take 1 tablet by mouth daily.    [provider]  NON FORMULARY Pure Vegan-D3 +K2 5 drops per day    [provider]  NON FORMULARY Zinc 10 drops per day    [provider]  NON FORMULARY Apply 1 application topically at bedtime. Earthly cream at bedtime    [provider]  nystatin cream (MYCOSTATIN) Apply 1 application topically 2 (two) times daily as needed (to affected areas- for irritation).    [provider]  olopatadine (PATANOL) 0.1 % ophthalmic solution Place 1 drop into both eyes daily as needed for allergies.    [provider]  omeprazole (PRILOSEC) 40 MG capsule Take 1 capsule by mouth once daily 07/22/21    Armbruster, Carlota Raspberry, MD  OXYGEN Inhale 1 L into the lungs continuous.    [provider]  Potassium 99 MG TABS Take 99 mg by mouth daily.    [provider]  predniSONE (DELTASONE) 20 MG tablet Take 10 mg by mouth every other day.    [provider]  propranolol (INDERAL) 10 MG tablet Take 10 mg by mouth 2 (two) times daily.    [provider]  Sodium Chloride-Sodium Bicarb (AYR SALINE NASAL RINSE NA) Place 1 spray into the nose daily as needed (sinus rinse).    [provider]  terbinafine (LAMISIL) 1 % cream Apply 1 application topically 2 (two) times daily. As needed    [provider]  triamcinolone (KENALOG) 0.025 % cream Apply 1 application topically 2 (two) times daily as needed (rash).    [provider]  TURMERIC PO Take 1 tablet by mouth in the morning and at bedtime. Turmeric Quercetin Bromelain    [provider]  VENTOLIN HFA 108 (90 Base) MCG/ACT inhaler Inhale 2 puffs into the lungs every 6 (six) hours as needed for wheezing or shortness of breath.    [provider]  vitamin B-12 (CYANOCOBALAMIN) 500 MCG tablet Take 500 mcg by mouth daily.    [provider]  vitamin C (ASCORBIC ACID) 500 MG tablet Take 500 mg by mouth daily.    [provider]      Allergies    Spiriva respimat [tiotropium bromide monohydrate], Latex, and Nickel    Review of Systems   Review of Systems  Constitutional:  Positive for chills, fatigue and fever (at home). Negative for diaphoresis.  HENT:  Positive for congestion.   Eyes:  Negative for visual disturbance.  Respiratory:  Positive for cough, sputum production, chest tightness and shortness of breath. Negative for choking and wheezing.   Cardiovascular:  Positive for chest pain. Negative for palpitations and leg swelling.  Gastrointestinal:  Negative for abdominal pain, constipation, diarrhea, nausea and vomiting.  Genitourinary:  Negative for  dysuria and frequency.  Musculoskeletal:  Negative for back pain, neck pain and neck stiffness.  Skin:  Negative for rash and wound.  Neurological:  Negative for dizziness, light-headedness and headaches.  Psychiatric/Behavioral:  Negative for agitation and confusion.   All other systems reviewed and are negative.  Physical Exam Updated Vital Signs BP (!) 181/97    Pulse 94    Temp 98.4 F (36.9 C) (Oral)    Resp (!) 27    Ht 5\' 1"  (1.549 m)    Wt 92.5 kg    SpO2 95%    BMI 38.55 kg/m  Physical Exam Vitals and nursing note reviewed.  Constitutional:      General: She is not in acute distress.    Appearance: She is well-developed. She is not ill-appearing, toxic-appearing or diaphoretic.  HENT:     Head: Normocephalic and atraumatic.  Eyes:     Conjunctiva/sclera: Conjunctivae normal.     Pupils: Pupils are equal, round, and reactive to light.  Cardiovascular:     Rate and Rhythm: Normal rate and regular rhythm.     Heart sounds: No murmur heard. Pulmonary:     Effort: Pulmonary effort is normal. Tachypnea present. No respiratory distress.     Breath sounds: No stridor. Wheezing and rhonchi present. No rales.  Chest:     Chest wall: No tenderness.  Abdominal:     Palpations: Abdomen is soft.     Tenderness: There is no abdominal tenderness.  Musculoskeletal:        General: No swelling.     Cervical back: Neck supple.     Right lower leg: No tenderness. Edema (mild) present.     Left lower leg: No tenderness. Edema (mild) present.  Skin:    General: Skin is warm and dry.     Capillary Refill: Capillary refill takes less than 2 seconds.     Findings: No erythema or rash.  Neurological:     General: No focal deficit present.     Mental Status: She is alert.  Psychiatric:        Mood and Affect: Mood normal.    ED Results / Procedures / Treatments   Labs (all labs ordered are listed, but only abnormal results are displayed) Labs Reviewed  BASIC METABOLIC PANEL -  Abnormal; Notable for the following components:      Result Value   Glucose, Bld 162 (*)    All other components within normal limits  RESP PANEL BY RT-PCR (FLU A&B, COVID) [id number]  CULTURE, BLOOD (ROUTINE X 2)  CULTURE, BLOOD (ROUTINE X 2)  CBC  BRAIN NATRIURETIC PEPTIDE  HEPATIC FUNCTION PANEL  LIPASE, BLOOD  TROPONIN I (HIGH SENSITIVITY)  TROPONIN I (HIGH SENSITIVITY)    EKG EKG Interpretation  Date/Time:  Thursday August 05 2021 18:13:13 EST Ventricular Rate:  89 PR Interval:  122 QRS Duration: 77 QT Interval:  354 QTC Calculation: 431 R Axis:   69 Text Interpretation: Sinus rhythm Ventricular trigeminy Borderline low voltage, extremity leads when compared to prior, more pvc with trigeminy. No STEMI Confirmed by Antony Blackbird [phone]) on 08/05/2021 6:54:53 PM  Radiology DG Chest 2 View  Result Date: 08/05/2021 CLINICAL DATA:  Shortness of breath. EXAM: CHEST - 2 VIEW COMPARISON:  Radiograph and CT 11/09/2020 FINDINGS: Mild cardiomegaly unchanged. Lower mediastinum accentuated by epicardial fat pads. Stable mediastinal contours. Chronic hyperinflation and central bronchial thickening. No acute airspace disease, pleural effusion, or pneumothorax no acute osseous abnormalities are seen. IMPRESSION: 1. No acute findings. 2. Stable mild cardiomegaly and hyperinflation. 3. Review of prior CT demonstrates a 7-8 mm nodule in the right middle lobe that was not mentioned in the initial report. Recommend elective chest CT follow-up on an outpatient basis after resolution of acute event. Electronically Signed   By: Keith Rake M.D.   On: 08/05/2021 20:03    Procedures Procedures    Medications Ordered in ED Medications  albuterol (VENTOLIN HFA) 108 (90 Base) MCG/ACT inhaler 2 puff (has no administration in time range)  magnesium sulfate IVPB 2 g 50 mL (0 g Intravenous Stopped 08/05/21 2052)    ED Course/ Medical Decision Making/ A&P                           Medical Decision  Making Amount and/or Complexity of Data Reviewed Labs: ordered. Radiology:  ordered.  Risk Prescription drug management. Decision regarding hospitalization.    Amanda Garrett is a 66 y.o. female with a past medical history significant for asthma, COPD on 1 L home oxygen, GERD, diabetes, previous cholecystectomy, and hypercholesterolemia who presents from PCP for fevers, chills, productive cough, hypoxia with increasing oxygen requirement, nausea, and some chest discomfort.  According to patient, for the last several days, she has been having worsened URI symptoms with productive cough, fatigue, and intermittent fevers at home.  She reports her temperature was 101 on Sunday.  She reports she has had some nausea but no vomiting but has had some intermittent chest tightness with her shortness of breath.  It is worsened with exertion.  She denies leg pain or leg swelling and does not any history of DVT or PE.  She reports no constipation, diarrhea, or urinary changes.  She denies any abdominal pain.  She reports that PCP started her on prednisone and antibiotics for bronchitis versus pneumonia with COPD exacerbation however given her worsened breathing and oxygen dipping to the 80s despite her home oxygen, she was sent in for evaluation.  EMS found diminished breathing and gave her nebulized albuterol and IV Solu-Medrol.  On my exam, patient is having wheezing and rhonchi.  Patient is tachypneic but is not tachycardic for me.  She is warm to the touch but is slightly afebrile.  Her oxygen saturations were dipping into the 80s on the 3 L she was increased to, she was increased to 4 L.  Otherwise she has some mild edema in her legs but had good pulses.  Her abdomen had normal bowel sounds and was nontender.  Chest was slightly tender.  EKG showed a trigeminy pattern with PVCs.  No STEMI.  Clinically I am concerned about either worsening pneumonia versus a viral infection exacerbating her chronic lung  troubles.  Will give magnesium to help with her breathing and continue to monitor.  We will get chest x-ray and labs.  We will get a cardiac work-up given the reported chest pressure she had several days ago.  She reports that the pressure is consistent with when she is having shortness of breath and does not feel like it was isolated chest pain.  Given her worsening oxygen requirement up to 4 times her baseline, I do anticipate she will need admission for likely COPD exacerbation in the setting of pneumonia versus viral infection worsening.  Anticipate reassessment after work-up to determine disposition.  Work-up continue to return.  Work-up overall reassuring thus far with troponin negative, COVID flu negative, hepatic function reassuring.  CBC shows no leukocytosis or anemia.  Metabolic panel reassuring.  Lipase not elevated.  BNP less than 100.  Chest x-ray shows no acute findings but reviewed by the radiologist did show an old nodule that I discussed with the patient she will follow-up with this.  However, clinically, she is still having worsened breathing, increased oxygen requirement from home from 1 to now 4 L to maintain oxygen saturations at 91 to 92%, and I suspect this is a COPD exacerbation in the setting of the temperature changes and likely a degree of viral URI.  We will call for admission for COPD exacerbation requiring 4 L and continues to have shortness of breath.         Final Clinical Impression(s) / ED Diagnoses Final diagnoses:  COPD exacerbation (HCC)    Clinical Impression: 1. COPD exacerbation ([place])     Disposition: Admit  This note was prepared  with assistance of Systems analyst. Occasional wrong-word or sound-a-like substitutions may have occurred due to the inherent limitations of voice recognition software.      Kaylib Furness, Gwenyth Allegra, MD 08/05/21 [phone]

## 2021-08-06 ENCOUNTER — Encounter (HOSPITAL_COMMUNITY): Payer: Self-pay | Admitting: Family Medicine

## 2021-08-06 ENCOUNTER — Other Ambulatory Visit: Payer: Self-pay

## 2021-08-06 ENCOUNTER — Ambulatory Visit: Payer: Medicare Other | Admitting: Internal Medicine

## 2021-08-06 DIAGNOSIS — J441 Chronic obstructive pulmonary disease with (acute) exacerbation: Secondary | ICD-10-CM | POA: Diagnosis not present

## 2021-08-06 DIAGNOSIS — R911 Solitary pulmonary nodule: Secondary | ICD-10-CM | POA: Diagnosis present

## 2021-08-06 DIAGNOSIS — J9621 Acute and chronic respiratory failure with hypoxia: Secondary | ICD-10-CM | POA: Diagnosis not present

## 2021-08-06 DIAGNOSIS — J9611 Chronic respiratory failure with hypoxia: Secondary | ICD-10-CM | POA: Insufficient documentation

## 2021-08-06 LAB — CBC
HCT: 39.4 % (ref 36.0–46.0)
Hemoglobin: 12.7 g/dL (ref 12.0–15.0)
MCH: 29.5 pg (ref 26.0–34.0)
MCHC: 32.2 g/dL (ref 30.0–36.0)
MCV: 91.6 fL (ref 80.0–100.0)
Platelets: 273 10*3/uL (ref 150–400)
RBC: 4.3 MIL/uL (ref 3.87–5.11)
RDW: 13.8 % (ref 11.5–15.5)
WBC: 8.5 10*3/uL (ref 4.0–10.5)
nRBC: 0 % (ref 0.0–0.2)

## 2021-08-06 LAB — BASIC METABOLIC PANEL
Anion gap: 9 (ref 5–15)
BUN: 21 mg/dL (ref 8–23)
CO2: 29 mmol/L (ref 22–32)
Calcium: 9.1 mg/dL (ref 8.9–10.3)
Chloride: 99 mmol/L (ref 98–111)
Creatinine, Ser: 0.79 mg/dL (ref 0.44–1.00)
GFR, Estimated: >60 mL/min (ref >60)
Glucose, Bld: 180 mg/dL — ABNORMAL HIGH (ref 70–99)
Potassium: 4.5 mmol/L (ref 3.5–5.1)
Sodium: 137 mmol/L (ref 135–145)

## 2021-08-06 LAB — GLUCOSE, CAPILLARY
Glucose-Capillary: 163 mg/dL — ABNORMAL HIGH (ref 70–99)
Glucose-Capillary: 137 mg/dL — ABNORMAL HIGH (ref 70–99)

## 2021-08-06 LAB — EXPECTORATED SPUTUM ASSESSMENT W GRAM STAIN, RFLX TO RESP C

## 2021-08-06 LAB — CBG MONITORING, ED
Glucose-Capillary: 173 mg/dL — ABNORMAL HIGH (ref 70–99)
Glucose-Capillary: 160 mg/dL — ABNORMAL HIGH (ref 70–99)

## 2021-08-06 MED ORDER — INSULIN ASPART 100 UNIT/ML IJ SOLN
6.0000 [IU] | Freq: Three times a day (TID) | INTRAMUSCULAR | Status: DC
  Administered 2021-08-06 – 2021-08-08 (×8): 6 [IU] via SUBCUTANEOUS
  Filled 2021-08-06: qty 0.06

## 2021-08-06 MED ORDER — PROPRANOLOL HCL 10 MG PO TABS
10.0000 mg | ORAL_TABLET | Freq: Two times a day (BID) | ORAL | Status: DC
  Administered 2021-08-06 – 2021-08-08 (×6): 10 mg via ORAL
  Filled 2021-08-06 (×6): qty 1

## 2021-08-06 MED ORDER — ATORVASTATIN CALCIUM 10 MG PO TABS
10.0000 mg | ORAL_TABLET | Freq: Every day | ORAL | Status: DC
  Administered 2021-08-06 – 2021-08-08 (×3): 10 mg via ORAL
  Filled 2021-08-06 (×3): qty 1

## 2021-08-06 MED ORDER — PREDNISONE 20 MG PO TABS
40.0000 mg | ORAL_TABLET | Freq: Every day | ORAL | Status: DC
  Administered 2021-08-07 – 2021-08-08 (×2): 40 mg via ORAL
  Filled 2021-08-06 (×2): qty 2

## 2021-08-06 MED ORDER — METHYLPREDNISOLONE SODIUM SUCC 125 MG IJ SOLR
125.0000 mg | Freq: Two times a day (BID) | INTRAMUSCULAR | Status: AC
  Administered 2021-08-06 (×2): 125 mg via INTRAVENOUS
  Filled 2021-08-06 (×2): qty 2

## 2021-08-06 MED ORDER — INSULIN ASPART 100 UNIT/ML IJ SOLN
0.0000 [IU] | Freq: Every day | INTRAMUSCULAR | Status: DC
  Filled 2021-08-06: qty 0.05

## 2021-08-06 MED ORDER — INSULIN ASPART 100 UNIT/ML IJ SOLN
0.0000 [IU] | Freq: Three times a day (TID) | INTRAMUSCULAR | Status: DC
  Filled 2021-08-06: qty 0.06

## 2021-08-06 MED ORDER — ONDANSETRON HCL 4 MG/2ML IJ SOLN: 4.0000 mg | Freq: Four times a day (QID) | INTRAMUSCULAR | Status: DC | PRN

## 2021-08-06 MED ORDER — ONDANSETRON HCL 4 MG PO TABS: 4.0000 mg | ORAL_TABLET | Freq: Four times a day (QID) | ORAL | Status: DC | PRN

## 2021-08-06 MED ORDER — SENNOSIDES-DOCUSATE SODIUM 8.6-50 MG PO TABS: 1.0000 | ORAL_TABLET | Freq: Every evening | ORAL | Status: DC | PRN

## 2021-08-06 MED ORDER — MELATONIN 5 MG PO TABS: 5.0000 mg | ORAL_TABLET | Freq: Every evening | ORAL | Status: DC | PRN

## 2021-08-06 MED ORDER — ALBUTEROL SULFATE (2.5 MG/3ML) 0.083% IN NEBU: 2.5000 mg | INHALATION_SOLUTION | RESPIRATORY_TRACT | Status: DC | PRN

## 2021-08-06 MED ORDER — ASPIRIN EC 81 MG PO TBEC
81.0000 mg | DELAYED_RELEASE_TABLET | Freq: Every day | ORAL | Status: DC
  Administered 2021-08-06 – 2021-08-08 (×3): 81 mg via ORAL
  Filled 2021-08-06 (×3): qty 1

## 2021-08-06 MED ORDER — ACETAMINOPHEN 325 MG PO TABS: 650.0000 mg | ORAL_TABLET | Freq: Four times a day (QID) | ORAL | Status: DC | PRN

## 2021-08-06 MED ORDER — ACETAMINOPHEN 650 MG RE SUPP: 650.0000 mg | Freq: Four times a day (QID) | RECTAL | Status: DC | PRN

## 2021-08-06 MED ORDER — ALBUTEROL SULFATE (2.5 MG/3ML) 0.083% IN NEBU
2.5000 mg | INHALATION_SOLUTION | Freq: Three times a day (TID) | RESPIRATORY_TRACT | Status: DC
  Administered 2021-08-06 – 2021-08-08 (×8): 2.5 mg via RESPIRATORY_TRACT
  Filled 2021-08-06 (×8): qty 3

## 2021-08-06 MED ORDER — PANTOPRAZOLE SODIUM 40 MG PO TBEC
40.0000 mg | DELAYED_RELEASE_TABLET | Freq: Every day | ORAL | Status: DC
  Administered 2021-08-06 – 2021-08-08 (×3): 40 mg via ORAL
  Filled 2021-08-06 (×3): qty 1

## 2021-08-06 MED ORDER — SODIUM CHLORIDE 0.9 % IV SOLN
1.0000 g | INTRAVENOUS | Status: DC
  Administered 2021-08-06 – 2021-08-08 (×3): 1 g via INTRAVENOUS
  Filled 2021-08-06 (×4): qty 10

## 2021-08-06 MED ORDER — ENOXAPARIN SODIUM 40 MG/0.4ML IJ SOSY
40.0000 mg | PREFILLED_SYRINGE | INTRAMUSCULAR | Status: DC
  Administered 2021-08-06 – 2021-08-08 (×3): 40 mg via SUBCUTANEOUS
  Filled 2021-08-06 (×3): qty 0.4

## 2021-08-06 MED ORDER — MONTELUKAST SODIUM 10 MG PO TABS
10.0000 mg | ORAL_TABLET | Freq: Every day | ORAL | Status: DC
  Administered 2021-08-06 – 2021-08-07 (×3): 10 mg via ORAL
  Filled 2021-08-06 (×3): qty 1

## 2021-08-06 MED ORDER — INSULIN ASPART 100 UNIT/ML IJ SOLN
0.0000 [IU] | Freq: Three times a day (TID) | INTRAMUSCULAR | Status: DC
  Administered 2021-08-06: 3 [IU] via SUBCUTANEOUS
  Administered 2021-08-07: 2 [IU] via SUBCUTANEOUS
  Administered 2021-08-07: 3 [IU] via SUBCUTANEOUS
  Administered 2021-08-07: 2 [IU] via SUBCUTANEOUS
  Administered 2021-08-08: 3 [IU] via SUBCUTANEOUS
  Filled 2021-08-06: qty 0.15

## 2021-08-06 NOTE — ED Notes (Signed)
Pt given warm blanket.

## 2021-08-06 NOTE — H&P (Signed)
History and Physical    Amanda Garrett SPQ:[id number] DOB: Apr 06, 1956 DOA: 08/05/2021  PCP: Leonard Downing, MD   Patient coming from: home   Chief Complaint: SOB, cough   HPI: Amanda Garrett is a very pleasant 66 y.o. female with medical history significant for patient with COPD, prediabetes, and chronic hypoxic respiratory failure now presents to the emergency department with worsening cough and shortness of breath.  Patient reports developing increased cough with sputum production 4 days ago and has had progressively worsening shortness of breath since then, becoming severe over the past day.  She was started on prednisone and doxycycline on 08/02/2021, but continued to worsen despite this.  She denies chest pain or leg swelling.  EMS was called and she was treated with nebs and IV Solu-Medrol prior to arrival in the ED.  ED Course: Upon arrival to the ED, patient is found to be afebrile, saturating low to mid 90s on 4 L/min of supplemental oxygen, tachypneic, and with stable blood pressure.  EKG features sinus rhythm with PVCs.  Chest x-ray negative for acute findings.  Patient was treated with IV magnesium in the ED.  Review of Systems:  All other systems reviewed and apart from HPI, are negative.  Past Medical History:  Diagnosis Date   Asthma    COPD (chronic obstructive pulmonary disease) ([place])    Diabetes mellitus without complication (HCC)    Emphysema of lung ([place])    GERD (gastroesophageal reflux disease)    Hypercholesteremia 2011   Hypertension    Lichen sclerosus et atrophicus    vulva   Oxygen deficiency    Palpitation    on Inderal    Past Surgical History:  Procedure Laterality Date   BIOPSY  03/01/2021   Procedure: BIOPSY;  Surgeon: Yetta Flock, MD;  Location: [facility];  Service: Gastroenterology;;   [place]     COLONOSCOPY WITH PROPOFOL N/A 03/01/2021   Procedure: COLONOSCOPY WITH PROPOFOL;   Surgeon: Yetta Flock, MD;  Location: [facility];  Service: Gastroenterology;  Laterality: N/A;   DILATION AND CURETTAGE, DIAGNOSTIC / THERAPEUTIC  05/2010   hysteroscopy, polypectomy   ESOPHAGOGASTRODUODENOSCOPY (EGD) WITH PROPOFOL N/A 03/01/2021   Procedure: ESOPHAGOGASTRODUODENOSCOPY (EGD) WITH PROPOFOL;  Surgeon: Yetta Flock, MD;  Location: [facility];  Service: Gastroenterology;  Laterality: N/A;   HEMOSTASIS CLIP PLACEMENT  03/01/2021   Procedure: HEMOSTASIS CLIP PLACEMENT;  Surgeon: Yetta Flock, MD;  Location: [facility];  Service: Gastroenterology;;   KNEE ARTHROSCOPY Right 2013   POLYPECTOMY  03/01/2021   Procedure: POLYPECTOMY;  Surgeon: Yetta Flock, MD;  Location: [facility];  Service: Gastroenterology;;   [place]   dr Pearline Cables    Social History:   reports that she quit smoking about 25 years ago. Her smoking use included cigarettes. She started smoking about 58 years ago. She has a 68.00 pack-year smoking history. She has never used smokeless tobacco. She reports current alcohol use. She reports that she does not use drugs.  Allergies  Allergen Reactions   Spiriva Respimat [Tiotropium Bromide Monohydrate] Anaphylaxis   Latex Rash and Other (See Comments)    NO powdered gloves!!   Nickel Rash    Family History  Problem Relation Age of Onset   Colon polyps Mother    Diabetes Mother    Diverticulitis Mother    Diabetes Father    Diabetes Maternal Grandfather  Hypertension Maternal Grandfather    Breast cancer Paternal Grandmother    Ulcerative colitis Daughter    COPD Neg Hx    Lung cancer Neg Hx    Colon cancer Neg Hx    Esophageal cancer Neg Hx    Pancreatic cancer Neg Hx    Stomach cancer Neg Hx    Rectal cancer Neg Hx      Prior to Admission medications   Medication Sig Start Date End Date Taking? Authorizing Provider  acetaminophen (TYLENOL) 500 MG tablet Take 500 mg by mouth every  8 (eight) hours as needed for moderate pain.    [provider]  albuterol (PROVENTIL) (2.5 MG/3ML) 0.083% nebulizer solution Take 2.5 mg by nebulization every 6 (six) hours as needed for wheezing or shortness of breath.    [provider]  Aspirin-Caffeine (ANACIN PO) Take 1 tablet by mouth daily as needed (pain).    [provider]  atorvastatin (LIPITOR) 10 MG tablet Take 10 mg by mouth daily.    [provider]  BAYER LOW DOSE 81 MG EC tablet Take 81 mg by mouth daily. Swallow whole.    [provider]  benzonatate (TESSALON) 200 MG capsule Take 200 mg by mouth 3 (three) times daily as needed for cough.    [provider]  BIOTIN PO Take 1 tablet by mouth daily.    [provider]  Calcium-Magnesium-Zinc (CAL-MAG-ZINC PO) Take 1 tablet by mouth daily.    [provider]  Cholecalciferol (VITAMIN D-3) 25 MCG (1000 UT) CAPS Take 1,000 Units by mouth daily.    [provider]  clobetasol ointment (TEMOVATE) 2.42 % Apply 1 application topically daily as needed (in the perineal area).    [provider]  famotidine (PEPCID) 20 MG tablet Take 1 tablet (20 mg total) by mouth 2 (two) times daily as needed for heartburn or indigestion. Patient taking differently: Take 20 mg by mouth at bedtime. 12/24/20   Armbruster, Carlota Raspberry, MD  Fluticasone-Umeclidin-Vilant (TRELEGY ELLIPTA) 200-62.5-25 MCG/ACT AEPB INHALE 1 PUFF ONCE DAILY 07/14/21   Spero Geralds, MD  loratadine (CLARITIN) 10 MG tablet Take 10 mg by mouth in the morning.    [provider]  MELATONIN PO Take 5 mg by mouth at bedtime as needed (sleep).    [provider]  metFORMIN (GLUCOPHAGE) 850 MG tablet Take 850 mg by mouth 2 (two) times daily with a meal.    [provider]  Misc Natural Products (SAMBUCUS ELDERBERRY IMMUNE) SYRP Take 5 mLs by mouth daily.    [provider]  montelukast (SINGULAIR) 10 MG tablet Take 10 mg  by mouth at bedtime.    [provider]  Multiple Vitamins-Minerals (AIRBORNE PO) Take 1 tablet by mouth daily.    [provider]  Multiple Vitamins-Minerals (CENTRUM SILVER ULTRA WOMENS) TABS Take 1 tablet by mouth daily.    [provider]  NON FORMULARY Pure Vegan-D3 +K2 5 drops per day    [provider]  NON FORMULARY Zinc 10 drops per day    [provider]  NON FORMULARY Apply 1 application topically at bedtime. Earthly cream at bedtime    [provider]  nystatin cream (MYCOSTATIN) Apply 1 application topically 2 (two) times daily as needed (to affected areas- for irritation).    [provider]  olopatadine (PATANOL) 0.1 % ophthalmic solution Place 1 drop into both eyes daily as needed for allergies.    [provider]  omeprazole (PRILOSEC) 40 MG capsule Take 1 capsule by mouth once daily 07/22/21   Armbruster, Carlota Raspberry, MD  OXYGEN Inhale 1 L into the lungs continuous.    [provider]  Potassium 99 MG TABS Take 99 mg by mouth daily.    [provider]  predniSONE (DELTASONE) 20 MG tablet Take 10 mg by mouth every other day.    [provider]  propranolol (INDERAL) 10 MG tablet Take 10 mg by mouth 2 (two) times daily.    [provider]  Sodium Chloride-Sodium Bicarb (AYR SALINE NASAL RINSE NA) Place 1 spray into the nose daily as needed (sinus rinse).    [provider]  terbinafine (LAMISIL) 1 % cream Apply 1 application topically 2 (two) times daily. As needed    [provider]  triamcinolone (KENALOG) 0.025 % cream Apply 1 application topically 2 (two) times daily as needed (rash).    [provider]  TURMERIC PO Take 1 tablet by mouth in the morning and at bedtime. Turmeric Quercetin Bromelain    [provider]  VENTOLIN HFA 108 (90 Base) MCG/ACT inhaler Inhale 2 puffs into the lungs every 6 (six) hours as needed for wheezing or shortness  of breath.    [provider]  vitamin B-12 (CYANOCOBALAMIN) 500 MCG tablet Take 500 mcg by mouth daily.    [provider]  vitamin C (ASCORBIC ACID) 500 MG tablet Take 500 mg by mouth daily.    [provider]    Physical Exam: Vitals:   08/05/21 2200 08/05/21 2230 08/05/21 2330 08/06/21 0230  BP: (!) 198/102 (!) 179/113 140/69 (!) 157/115  Pulse: 93 94 89 86  Resp: (!) 21 (!) 23 (!) 23 16  Temp:      TempSrc:      SpO2: 94% 91% 96% 98%  Weight:      Height:        Constitutional: NAD, calm  Eyes: PERTLA, lids and conjunctivae normal ENMT: Mucous membranes are moist. Posterior pharynx clear of any exudate or lesions.   Neck: supple, no masses  Respiratory: Diminished breath sounds bilaterally with prolonged expiratory phase.  Cardiovascular: S1 & S2 heard, regular rate and rhythm. No extremity edema.  Abdomen: No distension, no tenderness, soft. Bowel sounds active.  Musculoskeletal: no clubbing / cyanosis. No joint deformity upper and lower extremities.   Skin: no significant rashes, lesions, ulcers. Warm, dry, well-perfused. Neurologic: CN 2-12 grossly intact. Moving all extremities. Alert and oriented.  Psychiatric: Very pleasant. Cooperative.    Labs and Imaging on Admission: I have personally reviewed following labs and imaging studies  CBC: Recent Labs  Lab 08/05/21 1818  WBC 9.4  HGB 13.2  HCT 40.6  MCV 92.3  PLT 518   Basic Metabolic Panel: Recent Labs  Lab 08/05/21 1818  NA 136  K 4.8  CL 99  CO2 26  GLUCOSE 162*  BUN 19  CREATININE 0.66  CALCIUM 9.1   GFR: Estimated Creatinine Clearance: 69.8 mL/min (by C-G formula based on SCr of 0.66 mg/dL). Liver Function Tests: Recent Labs  Lab 08/05/21 1818  AST 35  ALT 22  ALKPHOS 61  BILITOT 1.0  PROT 7.7  ALBUMIN 4.0   Recent Labs  Lab 08/05/21 1818  LIPASE 34   No results for input(s): AMMONIA in the last 168 hours. Coagulation Profile: No results for input(s):  INR, PROTIME in the last 168 hours. Cardiac Enzymes: No results for input(s): CKTOTAL, CKMB, CKMBINDEX, TROPONINI in  the last 168 hours. BNP (last 3 results) No results for input(s): PROBNP in the last 8760 hours. HbA1C: No results for input(s): HGBA1C in the last 72 hours. CBG: No results for input(s): GLUCAP in the last 168 hours. Lipid Profile: No results for input(s): CHOL, HDL, LDLCALC, TRIG, CHOLHDL, LDLDIRECT in the last 72 hours. Thyroid Function Tests: No results for input(s): TSH, T4TOTAL, FREET4, T3FREE, THYROIDAB in the last 72 hours. Anemia Panel: No results for input(s): VITAMINB12, FOLATE, FERRITIN, TIBC, IRON, RETICCTPCT in the last 72 hours. Urine analysis: No results found for: COLORURINE, APPEARANCEUR, LABSPEC, PHURINE, GLUCOSEU, HGBUR, BILIRUBINUR, KETONESUR, PROTEINUR, UROBILINOGEN, NITRITE, LEUKOCYTESUR Sepsis Labs: @LABRCNTIP (procalcitonin:4,lacticidven:4) ) Recent Results (from the past 240 hour(s))  Resp Panel by RT-PCR (Flu A&B, Covid) Nasopharyngeal Swab     Status: None   Collection Time: 08/05/21  7:09 PM   Specimen: Nasopharyngeal Swab; Nasopharyngeal(NP) swabs in vial transport medium  Result Value Ref Range Status   SARS Coronavirus 2 by RT PCR NEGATIVE NEGATIVE Final    Comment: (NOTE) SARS-CoV-2 target nucleic acids are NOT DETECTED.  The SARS-CoV-2 RNA is generally detectable in upper respiratory specimens during the acute phase of infection. The lowest concentration of SARS-CoV-2 viral copies this assay can detect is 138 copies/mL. A negative result does not preclude SARS-Cov-2 infection and should not be used as the sole basis for treatment or other patient management decisions. A negative result may occur with  improper specimen collection/handling, submission of specimen other than nasopharyngeal swab, presence of viral mutation(s) within the areas targeted by this assay, and inadequate number of viral copies(<138 copies/mL). A negative  result must be combined with clinical observations, patient history, and epidemiological information. The expected result is Negative.  Fact Sheet for Patients:  EntrepreneurPulse.com.au  Fact Sheet for Healthcare Providers:  IncredibleEmployment.be  This test is no t yet approved or cleared by the [place] FDA and  has been authorized for detection and/or diagnosis of SARS-CoV-2 by FDA under an Emergency Use Authorization (EUA). This EUA will remain  in effect (meaning this test can be used) for the duration of the COVID-19 declaration under Section 564(b)(1) of the Act, 21 U.S.C.section 360bbb-3(b)(1), unless the authorization is terminated  or revoked sooner.       Influenza A by PCR NEGATIVE NEGATIVE Final   Influenza B by PCR NEGATIVE NEGATIVE Final    Comment: (NOTE) The Xpert Xpress SARS-CoV-2/FLU/RSV plus assay is intended as an aid in the diagnosis of influenza from Nasopharyngeal swab specimens and should not be used as a sole basis for treatment. Nasal washings and aspirates are unacceptable for Xpert Xpress SARS-CoV-2/FLU/RSV testing.  Fact Sheet for Patients: EntrepreneurPulse.com.au  Fact Sheet for Healthcare Providers: IncredibleEmployment.be  This test is not yet approved or cleared by the [place] FDA and has been authorized for detection and/or diagnosis of SARS-CoV-2 by FDA under an Emergency Use Authorization (EUA). This EUA will remain in effect (meaning this test can be used) for the duration of the COVID-19 declaration under Section 564(b)(1) of the Act, 21 U.S.C. section 360bbb-3(b)(1), unless the authorization is terminated or revoked.  Performed at [facility], [place] [address] [place], [place] [address]      Radiological Exams on Admission: DG Chest 2 View  Result Date: 08/05/2021 CLINICAL DATA:  Shortness of breath. EXAM: CHEST - 2 VIEW  COMPARISON:  Radiograph and CT 11/09/2020 FINDINGS: Mild cardiomegaly unchanged. Lower mediastinum accentuated by epicardial fat pads. Stable mediastinal contours. Chronic hyperinflation and central bronchial thickening.  No acute airspace disease, pleural effusion, or pneumothorax no acute osseous abnormalities are seen. IMPRESSION: 1. No acute findings. 2. Stable mild cardiomegaly and hyperinflation. 3. Review of prior CT demonstrates a 7-8 mm nodule in the right middle lobe that was not mentioned in the initial report. Recommend elective chest CT follow-up on an outpatient basis after resolution of acute event. Electronically Signed   By: Keith Rake M.D.   On: 08/05/2021 20:03    EKG: Independently reviewed. Sinus rhythm, PVCs.   Assessment/Plan   1. COPD with acute exacerbation; chronic hypoxic respiratory failure  - Pt with COPD and chronic respiratory failure p/w 4 days of progressive productive cough and SOB  - Improved with nebs and steroids with EMS but remains dyspneic at rest  - Culture sputum, continue systemic steroid, start antibiotic, continue nebs, continue supplemental O2     2. Lung nodule  - Noted incidentally on CT  - Outpatient follow-up recommended, discussed with patient and her daughter   3. Prediabetes  - A1c was 6.2% in June 2022  - She will be on systemic steroids  - Check CBGs and use low-intensity SSI if needed   DVT prophylaxis: Lovenox  Code Status: Full  Level of Care: Level of care: Med-Surg Family Communication: daughter at bedside  Disposition Plan:  Patient is from: home  Anticipated d/c is to: home  Anticipated d/c date is: 08/08/21  Patient currently: pending improvement in respiratory status  Consults called: None  Admission status: Inpatient     Vianne Bulls, MD [facility]  08/06/2021, 3:10 AM

## 2021-08-06 NOTE — Progress Notes (Signed)
[facility] PROGRESS NOTE    Progress Note  Amanda Garrett  FUX:[id number] DOB: 1955-08-09 DOA: 08/05/2021 PCP: Leonard Downing, MD     Brief Narrative:   Amanda Garrett is an 66 y.o. female past medical history significant for COPD on 1 L of oxygen at home, chronic hypoxic respiratory failure presents to the ED with worsening cough and shortness of breath that started 4 days prior to admission he was started on doxycycline on 08/02/2021 but eventually got worse came into the ED was found to be hypoxic placed on 4 L started on steroids antibiotics and inhalers was given IV bag in the ED   Assessment/Plan:   Acute on chronic respiratory failure with hypoxia secondary to   COPD with acute exacerbation ([place]): At home she usually uses 1 L of oxygen. Started on IV steroids inhalers and antibiotics. Continues to require 4 L to keep saturations greater than 90%. Culture data has been sent. Still wheezing on physical exam moving good air.  Lung nodule Incidental nodule noted on CT Will need follow-up as an outpatient in 6 to 4 weeks with repeated CT scan.  pre diabetes mellitus type 2 Her blood sugar will likely be erratic now that she has been started on steroids her A1c was 6.2 last year. Continue sliding scale insulin.  Morbid obesity Estimated body mass index is 38.55 kg/m as calculated from the following:   Height as of this encounter: 5\' 1"  (1.549 m).   Weight as of this encounter: 92.5 kg.  DVT prophylaxis: lovenox Family Communication:none Status is: Inpatient Remains inpatient appropriate because: Acute on chronic respiratory failure with hypoxia secondary to COPD     Code Status:     Code Status Orders  (From admission, onward)           Start     Ordered   08/06/21 0309  Full code  Continuous        08/06/21 0309           Code Status History     Date Active Date Inactive Code Status Order ID Comments User Context   11/09/2020 1732  11/11/2020 1821 Full Code [id number]  Jonnie Finner, DO Inpatient         IV Access:   Peripheral IV   Procedures and diagnostic studies:   DG Chest 2 View  Result Date: 08/05/2021 CLINICAL DATA:  Shortness of breath. EXAM: CHEST - 2 VIEW COMPARISON:  Radiograph and CT 11/09/2020 FINDINGS: Mild cardiomegaly unchanged. Lower mediastinum accentuated by epicardial fat pads. Stable mediastinal contours. Chronic hyperinflation and central bronchial thickening. No acute airspace disease, pleural effusion, or pneumothorax no acute osseous abnormalities are seen. IMPRESSION: 1. No acute findings. 2. Stable mild cardiomegaly and hyperinflation. 3. Review of prior CT demonstrates a 7-8 mm nodule in the right middle lobe that was not mentioned in the initial report. Recommend elective chest CT follow-up on an outpatient basis after resolution of acute event. Electronically Signed   By: Keith Rake M.D.   On: 08/05/2021 20:03     Medical Consultants:   None.   Subjective:    Amanda Garrett relates her breathing is better than when she came in.  Objective:    Vitals:   08/06/21 0400 08/06/21 0500 08/06/21 0600 08/06/21 0700  BP: (!) 151/59 (!) 146/70 (!) 143/67 (!) 152/94  Pulse: 76 68 72 78  Resp: 19 20 19  (!) 24  Temp:      TempSrc:  SpO2: 98% 97% 92% 97%  Weight:      Height:       SpO2: 97 % O2 Flow Rate (L/min): 2.5 L/min   Intake/Output Summary (Last 24 hours) at 08/06/2021 0705 Last data filed at 08/06/2021 0428 Gross per 24 hour  Intake 150 ml  Output 601 ml  Net -451 ml   Filed Weights   08/05/21 1815  Weight: 92.5 kg    Exam: General exam: In no acute distress. Respiratory system: Good air movement and diffuse wheezing bilaterally Cardiovascular system: S1 & S2 heard, RRR. No JVD. Gastrointestinal system: Abdomen is nondistended, soft and nontender.  Extremities: No pedal edema. Skin: No rashes, lesions or ulcers Psychiatry: Judgement and insight  appear normal. Mood & affect appropriate.    Data Reviewed:    Labs: Basic Metabolic Panel: Recent Labs  Lab 08/05/21 1818 08/06/21 0325  NA 136 137  K 4.8 4.5  CL 99 99  CO2 26 29  GLUCOSE 162* 180*  BUN 19 21  CREATININE 0.66 0.79  CALCIUM 9.1 9.1   GFR Estimated Creatinine Clearance: 69.8 mL/min (by C-G formula based on SCr of 0.79 mg/dL). Liver Function Tests: Recent Labs  Lab 08/05/21 1818  AST 35  ALT 22  ALKPHOS 61  BILITOT 1.0  PROT 7.7  ALBUMIN 4.0   Recent Labs  Lab 08/05/21 1818  LIPASE 34   No results for input(s): AMMONIA in the last 168 hours. Coagulation profile No results for input(s): INR, PROTIME in the last 168 hours. COVID-19 Labs  No results for input(s): DDIMER, FERRITIN, LDH, CRP in the last 72 hours.  Lab Results  Component Value Date   SARSCOV2NAA NEGATIVE 08/05/2021   [place] NEGATIVE 11/30/2020   [place] NEGATIVE 11/09/2020    CBC: Recent Labs  Lab 08/05/21 1818 08/06/21 0325  WBC 9.4 8.5  HGB 13.2 12.7  HCT 40.6 39.4  MCV 92.3 91.6  PLT 254 273   Cardiac Enzymes: No results for input(s): CKTOTAL, CKMB, CKMBINDEX, TROPONINI in the last 168 hours. BNP (last 3 results) No results for input(s): PROBNP in the last 8760 hours. CBG: No results for input(s): GLUCAP in the last 168 hours. D-Dimer: No results for input(s): DDIMER in the last 72 hours. Hgb A1c: No results for input(s): HGBA1C in the last 72 hours. Lipid Profile: No results for input(s): CHOL, HDL, LDLCALC, TRIG, CHOLHDL, LDLDIRECT in the last 72 hours. Thyroid function studies: No results for input(s): TSH, T4TOTAL, T3FREE, THYROIDAB in the last 72 hours.  Invalid input(s): FREET3 Anemia work up: No results for input(s): VITAMINB12, FOLATE, FERRITIN, TIBC, IRON, RETICCTPCT in the last 72 hours. Sepsis Labs: Recent Labs  Lab 08/05/21 1818 08/06/21 0325  WBC 9.4 8.5   Microbiology Recent Results (from the past 240 hour(s))  Resp Panel by  RT-PCR (Flu A&B, Covid) Nasopharyngeal Swab     Status: None   Collection Time: 08/05/21  7:09 PM   Specimen: Nasopharyngeal Swab; Nasopharyngeal(NP) swabs in vial transport medium  Result Value Ref Range Status   SARS Coronavirus 2 by RT PCR NEGATIVE NEGATIVE Final    Comment: (NOTE) SARS-CoV-2 target nucleic acids are NOT DETECTED.  The SARS-CoV-2 RNA is generally detectable in upper respiratory specimens during the acute phase of infection. The lowest concentration of SARS-CoV-2 viral copies this assay can detect is 138 copies/mL. A negative result does not preclude SARS-Cov-2 infection and should not be used as the sole basis for treatment or other patient management decisions. A negative result may  occur with  improper specimen collection/handling, submission of specimen other than nasopharyngeal swab, presence of viral mutation(s) within the areas targeted by this assay, and inadequate number of viral copies(<138 copies/mL). A negative result must be combined with clinical observations, patient history, and epidemiological information. The expected result is Negative.  Fact Sheet for Patients:  EntrepreneurPulse.com.au  Fact Sheet for Healthcare Providers:  IncredibleEmployment.be  This test is no t yet approved or cleared by the [place] FDA and  has been authorized for detection and/or diagnosis of SARS-CoV-2 by FDA under an Emergency Use Authorization (EUA). This EUA will remain  in effect (meaning this test can be used) for the duration of the COVID-19 declaration under Section 564(b)(1) of the Act, 21 U.S.C.section 360bbb-3(b)(1), unless the authorization is terminated  or revoked sooner.       Influenza A by PCR NEGATIVE NEGATIVE Final   Influenza B by PCR NEGATIVE NEGATIVE Final    Comment: (NOTE) The Xpert Xpress SARS-CoV-2/FLU/RSV plus assay is intended as an aid in the diagnosis of influenza from Nasopharyngeal swab  specimens and should not be used as a sole basis for treatment. Nasal washings and aspirates are unacceptable for Xpert Xpress SARS-CoV-2/FLU/RSV testing.  Fact Sheet for Patients: EntrepreneurPulse.com.au  Fact Sheet for Healthcare Providers: IncredibleEmployment.be  This test is not yet approved or cleared by the [place] FDA and has been authorized for detection and/or diagnosis of SARS-CoV-2 by FDA under an Emergency Use Authorization (EUA). This EUA will remain in effect (meaning this test can be used) for the duration of the COVID-19 declaration under Section 564(b)(1) of the Act, 21 U.S.C. section 360bbb-3(b)(1), unless the authorization is terminated or revoked.  Performed at [facility], [place] [address] [place], [place] [address]      Medications:    albuterol  2.5 mg Nebulization TID   aspirin EC  81 mg Oral Daily   atorvastatin  10 mg Oral Daily   enoxaparin (LOVENOX) injection  40 mg Subcutaneous Q24H   insulin aspart  0-6 Units Subcutaneous TID WC   methylPREDNISolone (SOLU-MEDROL) injection  125 mg Intravenous Q12H   Followed by   Derrill Memo ON 08/07/2021] predniSONE  40 mg Oral Q breakfast   montelukast  10 mg Oral QHS   pantoprazole  40 mg Oral Daily   propranolol  10 mg Oral BID   Continuous Infusions:  cefTRIAXone (ROCEPHIN)  IV Stopped (08/06/21 0428)      LOS: 1 day   Charlynne Cousins  [facility]  08/06/2021, 7:05 AM

## 2021-08-07 DIAGNOSIS — J441 Chronic obstructive pulmonary disease with (acute) exacerbation: Secondary | ICD-10-CM | POA: Diagnosis not present

## 2021-08-07 DIAGNOSIS — J9621 Acute and chronic respiratory failure with hypoxia: Secondary | ICD-10-CM | POA: Diagnosis not present

## 2021-08-07 LAB — GLUCOSE, CAPILLARY
Glucose-Capillary: 121 mg/dL — ABNORMAL HIGH (ref 70–99)
Glucose-Capillary: 179 mg/dL — ABNORMAL HIGH (ref 70–99)
Glucose-Capillary: 126 mg/dL — ABNORMAL HIGH (ref 70–99)
Glucose-Capillary: 163 mg/dL — ABNORMAL HIGH (ref 70–99)

## 2021-08-07 LAB — HEMOGLOBIN A1C
Hgb A1c MFr Bld: 5.8 % — ABNORMAL HIGH (ref 4.8–5.6)
Mean Plasma Glucose: 120 mg/dL

## 2021-08-07 LAB — BASIC METABOLIC PANEL
Anion gap: 7 (ref 5–15)
BUN: 22 mg/dL (ref 8–23)
CO2: 30 mmol/L (ref 22–32)
Calcium: 8.7 mg/dL — ABNORMAL LOW (ref 8.9–10.3)
Chloride: 102 mmol/L (ref 98–111)
Creatinine, Ser: 0.78 mg/dL (ref 0.44–1.00)
GFR, Estimated: >60 mL/min (ref >60)
Glucose, Bld: 166 mg/dL — ABNORMAL HIGH (ref 70–99)
Potassium: 4.2 mmol/L (ref 3.5–5.1)
Sodium: 139 mmol/L (ref 135–145)

## 2021-08-07 LAB — CBC
HCT: 39.6 % (ref 36.0–46.0)
Hemoglobin: 12.6 g/dL (ref 12.0–15.0)
MCH: 29.5 pg (ref 26.0–34.0)
MCHC: 31.8 g/dL (ref 30.0–36.0)
MCV: 92.7 fL (ref 80.0–100.0)
Platelets: 284 10*3/uL (ref 150–400)
RBC: 4.27 MIL/uL (ref 3.87–5.11)
RDW: 13.4 % (ref 11.5–15.5)
WBC: 11 10*3/uL — ABNORMAL HIGH (ref 4.0–10.5)
nRBC: 0 % (ref 0.0–0.2)

## 2021-08-07 NOTE — Progress Notes (Signed)
[facility] PROGRESS NOTE    Progress Note  Amanda Garrett  OEV:[id number] DOB: Mar 14, 1956 DOA: 08/05/2021 PCP: Leonard Downing, MD     Brief Narrative:   Amanda Garrett is an 66 y.o. female past medical history significant for COPD on 1 L of oxygen at home, chronic hypoxic respiratory failure presents to the ED with worsening cough and shortness of breath that started 4 days prior to admission he was started on doxycycline on 08/02/2021 but eventually got worse came into the ED was found to be hypoxic placed on 4 L started on steroids antibiotics and inhalers was given IV bag in the ED   Assessment/Plan:   Acute on chronic respiratory failure with hypoxia secondary to   COPD with acute exacerbation ([place]): At home she usually uses 1 L of oxygen. Here she continues to require 4 L of oxygen to keep saturations greater than 88%. Continue IV steroids and antibiotics. Culture data has remained negative till date She relates her breathing is not improved, she continues to have expiratory wheezing  Lung nodule Incidental nodule noted on CT Will need follow-up as an outpatient in 6 to 4 weeks with repeated CT scan.  Pre diabetes mellitus type 2 Blood sugars well controlled, she was started on sliding scale insulin as she was started on steroids which is making her blood glucose erratic. Hemoglobin A1c of 5.8 continue sliding scale  Morbid obesity Estimated body mass index is 38.55 kg/m as calculated from the following:   Height as of this encounter: 5\' 1"  (1.549 m).   Weight as of this encounter: 92.5 kg.  DVT prophylaxis: lovenox Family Communication:none Status is: Inpatient Remains inpatient appropriate because: Acute on chronic respiratory failure with hypoxia secondary to COPD     Code Status:     Code Status Orders  (From admission, onward)           Start     Ordered   08/06/21 0309  Full code  Continuous        08/06/21 0309           Code  Status History     Date Active Date Inactive Code Status Order ID Comments User Context   11/09/2020 1732 11/11/2020 1821 Full Code [id number]  Jonnie Finner, DO Inpatient         IV Access:   Peripheral IV   Procedures and diagnostic studies:   DG Chest 2 View  Result Date: 08/05/2021 CLINICAL DATA:  Shortness of breath. EXAM: CHEST - 2 VIEW COMPARISON:  Radiograph and CT 11/09/2020 FINDINGS: Mild cardiomegaly unchanged. Lower mediastinum accentuated by epicardial fat pads. Stable mediastinal contours. Chronic hyperinflation and central bronchial thickening. No acute airspace disease, pleural effusion, or pneumothorax no acute osseous abnormalities are seen. IMPRESSION: 1. No acute findings. 2. Stable mild cardiomegaly and hyperinflation. 3. Review of prior CT demonstrates a 7-8 mm nodule in the right middle lobe that was not mentioned in the initial report. Recommend elective chest CT follow-up on an outpatient basis after resolution of acute event. Electronically Signed   By: Keith Rake M.D.   On: 08/05/2021 20:03     Medical Consultants:   None.   Subjective:    [name] relates her breathing is not improved.  Objective:    Vitals:   08/06/21 2206 08/07/21 0315 08/07/21 0635 08/07/21 0755  BP: (!) 176/87 (!) 152/59 (!) 152/64   Pulse: 78 74 71   Resp: 17 17 17  Temp: 97.7 F (36.5 C) 98 F (36.7 C) 98 F (36.7 C)   TempSrc: Oral     SpO2: 97% 98% 100% 95%  Weight:      Height:       SpO2: 95 % O2 Flow Rate (L/min): 4 L/min   Intake/Output Summary (Last 24 hours) at 08/07/2021 0803 Last data filed at 08/06/2021 1900 Gross per 24 hour  Intake 240 ml  Output --  Net 240 ml    Filed Weights   08/05/21 1815  Weight: 92.5 kg    Exam: General exam: In no acute distress. Respiratory system: Good air movement and diffuse wheezing bilaterally expiratory Cardiovascular system: S1 & S2 heard, RRR. No JVD. Gastrointestinal system: Abdomen is  nondistended, soft and nontender.  Extremities: No pedal edema. Skin: No rashes, lesions or ulcers Psychiatry: Judgement and insight appear normal. Mood & affect appropriate.   Data Reviewed:    Labs: Basic Metabolic Panel: Recent Labs  Lab 08/05/21 1818 08/06/21 0325 08/07/21 0546  NA 136 137 139  K 4.8 4.5 4.2  CL 99 99 102  CO2 26 29 30   GLUCOSE 162* 180* 166*  BUN 19 21 22   CREATININE 0.66 0.79 0.78  CALCIUM 9.1 9.1 8.7*    GFR Estimated Creatinine Clearance: 69.8 mL/min (by C-G formula based on SCr of 0.78 mg/dL). Liver Function Tests: Recent Labs  Lab 08/05/21 1818  AST 35  ALT 22  ALKPHOS 61  BILITOT 1.0  PROT 7.7  ALBUMIN 4.0    Recent Labs  Lab 08/05/21 1818  LIPASE 34    No results for input(s): AMMONIA in the last 168 hours. Coagulation profile No results for input(s): INR, PROTIME in the last 168 hours. COVID-19 Labs  No results for input(s): DDIMER, FERRITIN, LDH, CRP in the last 72 hours.  Lab Results  Component Value Date   SARSCOV2NAA NEGATIVE 08/05/2021   [place] NEGATIVE 11/30/2020   [place] NEGATIVE 11/09/2020    CBC: Recent Labs  Lab 08/05/21 1818 08/06/21 0325 08/07/21 0546  WBC 9.4 8.5 11.0*  HGB 13.2 12.7 12.6  HCT 40.6 39.4 39.6  MCV 92.3 91.6 92.7  PLT 254 273 284    Cardiac Enzymes: No results for input(s): CKTOTAL, CKMB, CKMBINDEX, TROPONINI in the last 168 hours. BNP (last 3 results) No results for input(s): PROBNP in the last 8760 hours. CBG: Recent Labs  Lab 08/06/21 0754 08/06/21 1145 08/06/21 1722 08/06/21 2118  GLUCAP 173* 160* 163* 137*   D-Dimer: No results for input(s): DDIMER in the last 72 hours. Hgb A1c: Recent Labs    08/06/21 0325  HGBA1C 5.8*   Lipid Profile: No results for input(s): CHOL, HDL, LDLCALC, TRIG, CHOLHDL, LDLDIRECT in the last 72 hours. Thyroid function studies: No results for input(s): TSH, T4TOTAL, T3FREE, THYROIDAB in the last 72 hours.  Invalid  input(s): FREET3 Anemia work up: No results for input(s): VITAMINB12, FOLATE, FERRITIN, TIBC, IRON, RETICCTPCT in the last 72 hours. Sepsis Labs: Recent Labs  Lab 08/05/21 1818 08/06/21 0325 08/07/21 0546  WBC 9.4 8.5 11.0*    Microbiology Recent Results (from the past 240 hour(s))  Resp Panel by RT-PCR (Flu A&B, Covid) Nasopharyngeal Swab     Status: None   Collection Time: 08/05/21  7:09 PM   Specimen: Nasopharyngeal Swab; Nasopharyngeal(NP) swabs in vial transport medium  Result Value Ref Range Status   SARS Coronavirus 2 by RT PCR NEGATIVE NEGATIVE Final    Comment: (NOTE) SARS-CoV-2 target nucleic acids are NOT DETECTED.  The SARS-CoV-2 RNA is generally detectable in upper respiratory specimens during the acute phase of infection. The lowest concentration of SARS-CoV-2 viral copies this assay can detect is 138 copies/mL. A negative result does not preclude SARS-Cov-2 infection and should not be used as the sole basis for treatment or other patient management decisions. A negative result may occur with  improper specimen collection/handling, submission of specimen other than nasopharyngeal swab, presence of viral mutation(s) within the areas targeted by this assay, and inadequate number of viral copies(<138 copies/mL). A negative result must be combined with clinical observations, patient history, and epidemiological information. The expected result is Negative.  Fact Sheet for Patients:  EntrepreneurPulse.com.au  Fact Sheet for Healthcare Providers:  IncredibleEmployment.be  This test is no t yet approved or cleared by the [place] FDA and  has been authorized for detection and/or diagnosis of SARS-CoV-2 by FDA under an Emergency Use Authorization (EUA). This EUA will remain  in effect (meaning this test can be used) for the duration of the COVID-19 declaration under Section 564(b)(1) of the Act, 21 U.S.C.section  360bbb-3(b)(1), unless the authorization is terminated  or revoked sooner.       Influenza A by PCR NEGATIVE NEGATIVE Final   Influenza B by PCR NEGATIVE NEGATIVE Final    Comment: (NOTE) The Xpert Xpress SARS-CoV-2/FLU/RSV plus assay is intended as an aid in the diagnosis of influenza from Nasopharyngeal swab specimens and should not be used as a sole basis for treatment. Nasal washings and aspirates are unacceptable for Xpert Xpress SARS-CoV-2/FLU/RSV testing.  Fact Sheet for Patients: EntrepreneurPulse.com.au  Fact Sheet for Healthcare Providers: IncredibleEmployment.be  This test is not yet approved or cleared by the [place] FDA and has been authorized for detection and/or diagnosis of SARS-CoV-2 by FDA under an Emergency Use Authorization (EUA). This EUA will remain in effect (meaning this test can be used) for the duration of the COVID-19 declaration under Section 564(b)(1) of the Act, 21 U.S.C. section 360bbb-3(b)(1), unless the authorization is terminated or revoked.  Performed at [facility], [place] [address] [place], [place] [address]   Blood culture (routine x 2)     Status: None (Preliminary result)   Collection Time: 08/05/21  7:31 PM   Specimen: BLOOD  Result Value Ref Range Status   Specimen Description   Final    BLOOD BLOOD LEFT HAND Performed at [place] [address] [place], [place] [address]    Special Requests   Final    BOTTLES DRAWN AEROBIC AND ANAEROBIC Blood Culture adequate volume Performed at [place] [address] [place], [place] [address]    Culture   Final    NO GROWTH < 12 HOURS Performed at [place] [address], [place], [place] [address]    Report Status PENDING  Incomplete  Blood culture (routine x 2)     Status: None (Preliminary result)   Collection Time: 08/05/21  7:40 PM   Specimen: BLOOD  Result Value  Ref Range Status   Specimen Description   Final    BLOOD LEFT ANTECUBITAL Performed at [place] [address] [place], [place] [address]    Special Requests   Final    BOTTLES DRAWN AEROBIC AND ANAEROBIC Blood Culture results may not be optimal due to an excessive volume of blood received in culture bottles Performed at [place] [address] [place], [place] [address]    Culture  Final    NO GROWTH < 12 HOURS Performed at [place] [address] [place], [place] [address]    Report Status PENDING  Incomplete  Expectorated Sputum Assessment w Gram Stain, Rflx to Resp Cult     Status: None   Collection Time: 08/06/21  3:08 AM   Specimen: Sputum  Result Value Ref Range Status   Specimen Description SPUTUM  Final   Special Requests NONE  Final   Sputum evaluation   Final    Sputum specimen not acceptable for testing.  Please recollect.   Performed at [facility], [place] [address] [place], [place] [address]    Report Status 08/06/2021 FINAL  Final     Medications:    albuterol  2.5 mg Nebulization TID   aspirin EC  81 mg Oral Daily   atorvastatin  10 mg Oral Daily   enoxaparin (LOVENOX) injection  40 mg Subcutaneous Q24H   insulin aspart  0-15 Units Subcutaneous TID WC   insulin aspart  0-5 Units Subcutaneous QHS   insulin aspart  6 Units Subcutaneous TID WC   montelukast  10 mg Oral QHS   pantoprazole  40 mg Oral Daily   predniSONE  40 mg Oral Q breakfast   propranolol  10 mg Oral BID   Continuous Infusions:  cefTRIAXone (ROCEPHIN)  IV 1 g (08/07/21 0507)      LOS: 2 days   Charlynne Cousins  [facility]  08/07/2021, 8:03 AM

## 2021-08-08 DIAGNOSIS — J441 Chronic obstructive pulmonary disease with (acute) exacerbation: Secondary | ICD-10-CM | POA: Diagnosis not present

## 2021-08-08 DIAGNOSIS — R911 Solitary pulmonary nodule: Secondary | ICD-10-CM | POA: Diagnosis not present

## 2021-08-08 DIAGNOSIS — J9621 Acute and chronic respiratory failure with hypoxia: Secondary | ICD-10-CM | POA: Diagnosis not present

## 2021-08-08 LAB — CBC
HCT: 39 % (ref 36.0–46.0)
Hemoglobin: 12.3 g/dL (ref 12.0–15.0)
MCH: 29.6 pg (ref 26.0–34.0)
MCHC: 31.5 g/dL (ref 30.0–36.0)
MCV: 93.8 fL (ref 80.0–100.0)
Platelets: 266 10*3/uL (ref 150–400)
RBC: 4.16 MIL/uL (ref 3.87–5.11)
RDW: 13.7 % (ref 11.5–15.5)
WBC: 9.1 10*3/uL (ref 4.0–10.5)
nRBC: 0 % (ref 0.0–0.2)

## 2021-08-08 LAB — BASIC METABOLIC PANEL
Anion gap: 6 (ref 5–15)
BUN: 24 mg/dL — ABNORMAL HIGH (ref 8–23)
CO2: 32 mmol/L (ref 22–32)
Calcium: 8.4 mg/dL — ABNORMAL LOW (ref 8.9–10.3)
Chloride: 99 mmol/L (ref 98–111)
Creatinine, Ser: 0.86 mg/dL (ref 0.44–1.00)
GFR, Estimated: >60 mL/min (ref >60)
Glucose, Bld: 99 mg/dL (ref 70–99)
Potassium: 3.9 mmol/L (ref 3.5–5.1)
Sodium: 137 mmol/L (ref 135–145)

## 2021-08-08 LAB — GLUCOSE, CAPILLARY
Glucose-Capillary: 95 mg/dL (ref 70–99)
Glucose-Capillary: 182 mg/dL — ABNORMAL HIGH (ref 70–99)

## 2021-08-08 MED ORDER — PREDNISONE 10 MG PO TABS: ORAL_TABLET | ORAL | 0 refills | Status: DC

## 2021-08-08 MED ORDER — DOXYCYCLINE HYCLATE 100 MG PO TABS: 100.0000 mg | ORAL_TABLET | Freq: Two times a day (BID) | ORAL | 0 refills | Status: AC

## 2021-08-08 NOTE — Progress Notes (Signed)
Patient discharged to home with family, discharge instructions reviewed with patient and family who verbalized understanding.

## 2021-08-08 NOTE — Discharge Summary (Addendum)
Physician Discharge Summary  Amanda Garrett OZH:[id number] DOB: [birth year] DOA: 08/05/2021  PCP: Leonard Downing, MD  Admit date: 08/05/2021 Discharge date: 08/08/2021  Admitted From: Home Disposition:  Home  Recommendations for Outpatient Follow-up:  Follow up with Pulmonary in 1-2 weeks We will need to repeat a CT Of the chest in 4 to 6 weeks as she had an incidental pulmonary nodule on CT scan on 11/10/2018  Home Health:No Equipment/Devices:oxygen  Discharge Condition:Stable CODE STATUS:Full Diet recommendation: Heart Healthy  Brief/Interim Summary: 66 y.o. female past medical history significant for COPD on 1 L of oxygen at home, chronic hypoxic respiratory failure presents to the ED with worsening cough and shortness of breath that started 4 days prior to admission he was started on doxycycline on 08/02/2021 but eventually got worse came into the ED was found to be hypoxic placed on 4 L started on steroids antibiotics and inhalers was given IV bag in the ED    Discharge Diagnoses:  Principal Problem:   COPD with acute exacerbation ([place]) Active Problems:   Lung nodule   Acute on chronic respiratory failure with hypoxia ([place])  Acute on chronic respiratory failure with hypoxia secondary to COPD exacerbation: At home she is on 1 L of oxygen she had to crank it up to 4 in the ED she was requiring 4 L to keep saturations greater than 88%. She was started on steroids, antibiotics and inhalers culture data remain negative till date. After 3 days of treatment her oxygen requirements were less she will go home on oxygen and steroid tapers and antibiotics.  Incidental pulmonary nodule: Noted on previous CT scan she will she will need repeat CT scan as an outpatient in 6 to 8 weeks after discharge.  Diabetes mellitus type II: Her blood sugar is well controlled on minimal sliding scale insulin she is on metformin at home she will continue her A1c was 5.8.  Morbid obesity: She has  been counseled.   Discharge Instructions  Discharge Instructions     Diet - low sodium heart healthy   Complete by: As directed    Increase activity slowly   Complete by: As directed       Allergies as of 08/08/2021       Reactions   Spiriva Respimat [tiotropium Bromide Monohydrate] Anaphylaxis   Latex Rash, Other (See Comments)   NO powdered gloves!!   Nickel Rash        Medication List     STOP taking these medications    MELATONIN PO       TAKE these medications    atorvastatin 10 MG tablet Commonly known as: LIPITOR Take 10 mg by mouth daily.   AYR SALINE NASAL RINSE NA Place 1 spray into the nose daily as needed (sinus rinse).   B-12 2500 MCG Tabs Take 2,500 mcg by mouth daily.   Bayer Low Dose 81 MG EC tablet Generic drug: aspirin Take 81 mg by mouth daily. Swallow whole.   CAL-MAG-ZINC PO Take 1 tablet by mouth daily.   Centrum Silver Ultra Womens Tabs Take 1 tablet by mouth daily.   AIRBORNE PO Take 1 tablet by mouth daily.   clobetasol ointment 0.05 % Commonly known as: TEMOVATE Apply 1 application topically daily as needed (in the perineal area).   doxycycline 100 MG tablet Commonly known as: VIBRA-TABS Take 1 tablet (100 mg total) by mouth 2 (two) times daily for 3 days.   famotidine 20 MG tablet Commonly known as: Pepcid Take  1 tablet (20 mg total) by mouth 2 (two) times daily as needed for heartburn or indigestion. What changed: when to take this   guaiFENesin 600 MG 12 hr tablet Commonly known as: MUCINEX Take 600 mg by mouth 2 (two) times daily as needed for cough.   loratadine 10 MG tablet Commonly known as: CLARITIN Take 10 mg by mouth in the morning.   melatonin 5 MG Tabs Take 2.5 mg by mouth at bedtime as needed (sleep).   metFORMIN 850 MG tablet Commonly known as: GLUCOPHAGE Take 850 mg by mouth 2 (two) times daily with a meal.   montelukast 10 MG tablet Commonly known as: SINGULAIR Take 10 mg by mouth at  bedtime.   omeprazole 40 MG capsule Commonly known as: PRILOSEC Take 1 capsule by mouth once daily What changed: when to take this   OXYGEN Inhale 1 L into the lungs continuous.   Potassium 99 MG Tabs Take 99 mg by mouth daily.   predniSONE 10 MG tablet Commonly known as: DELTASONE Takes 6 tablets for 1 days, then 5 tablets for 1 days, then 4 tablets for 1 days, then 3 tablets for 1 days, then 2 tabs for 1 days, then 1 tab for 1 days, and then stop. What changed:  medication strength how much to take how to take this when to take this additional instructions   propranolol 10 MG tablet Commonly known as: INDERAL Take 10 mg by mouth 2 (two) times daily.   Sambucus Elderberry Immune Syrp Take 5 mLs by mouth daily.   Trelegy Ellipta 200-62.5-25 MCG/ACT Aepb Generic drug: Fluticasone-Umeclidin-Vilant INHALE 1 PUFF ONCE DAILY What changed: See the new instructions.   triamcinolone 0.025 % cream Commonly known as: KENALOG Apply 1 application topically 2 (two) times daily as needed (rash).   Ventolin HFA 108 (90 Base) MCG/ACT inhaler Generic drug: albuterol Inhale 2 puffs into the lungs every 6 (six) hours as needed for wheezing or shortness of breath.   albuterol (2.5 MG/3ML) 0.083% nebulizer solution Commonly known as: PROVENTIL Take 2.5 mg by nebulization every 6 (six) hours as needed for wheezing or shortness of breath.   vitamin C 500 MG tablet Commonly known as: ASCORBIC ACID Take 500 mg by mouth daily.   Vitamin D-3 25 MCG (1000 UT) Caps Take 1,000 Units by mouth daily.        Allergies  Allergen Reactions   Spiriva Respimat [Tiotropium Bromide Monohydrate] Anaphylaxis   Latex Rash and Other (See Comments)    NO powdered gloves!!   Nickel Rash    Consultations: None   Procedures/Studies: DG Chest 2 View  Result Date: 08/05/2021 CLINICAL DATA:  Shortness of breath. EXAM: CHEST - 2 VIEW COMPARISON:  Radiograph and CT 11/09/2020 FINDINGS: Mild  cardiomegaly unchanged. Lower mediastinum accentuated by epicardial fat pads. Stable mediastinal contours. Chronic hyperinflation and central bronchial thickening. No acute airspace disease, pleural effusion, or pneumothorax no acute osseous abnormalities are seen. IMPRESSION: 1. No acute findings. 2. Stable mild cardiomegaly and hyperinflation. 3. Review of prior CT demonstrates a 7-8 mm nodule in the right middle lobe that was not mentioned in the initial report. Recommend elective chest CT follow-up on an outpatient basis after resolution of acute event. Electronically Signed   By: Keith Rake M.D.   On: 08/05/2021 20:03     Subjective: She relates her breathing is better than yesterday.  Discharge Exam: Vitals:   08/08/21 0427 08/08/21 0910  BP: 121/60   Pulse: 65   Resp:  16   Temp: (!) 97.4 F (36.3 C)   SpO2: 99% 94%   Vitals:   08/07/21 2007 08/07/21 2029 08/08/21 0427 08/08/21 0910  BP: (!) 176/70  121/60   Pulse: 79  65   Resp: 14  16   Temp: 97.9 F (36.6 C)  (!) 97.4 F (36.3 C)   TempSrc: Oral  Oral   SpO2: 96% 97% 99% 94%  Weight:      Height:        General: Pt is alert, awake, not in acute distress Cardiovascular: RRR, S1/S2 +, no rubs, no gallops Respiratory: CTA bilaterally, no wheezing, no rhonchi Abdominal: Soft, NT, ND, bowel sounds + Extremities: no edema, no cyanosis    The results of significant diagnostics from this hospitalization (including imaging, microbiology, ancillary and laboratory) are listed below for reference.     Microbiology: Recent Results (from the past 240 hour(s))  Resp Panel by RT-PCR (Flu A&B, Covid) Nasopharyngeal Swab     Status: None   Collection Time: 08/05/21  7:09 PM   Specimen: Nasopharyngeal Swab; Nasopharyngeal(NP) swabs in vial transport medium  Result Value Ref Range Status   SARS Coronavirus 2 by RT PCR NEGATIVE NEGATIVE Final    Comment: (NOTE) SARS-CoV-2 target nucleic acids are NOT DETECTED.  The  SARS-CoV-2 RNA is generally detectable in upper respiratory specimens during the acute phase of infection. The lowest concentration of SARS-CoV-2 viral copies this assay can detect is 138 copies/mL. A negative result does not preclude SARS-Cov-2 infection and should not be used as the sole basis for treatment or other patient management decisions. A negative result may occur with  improper specimen collection/handling, submission of specimen other than nasopharyngeal swab, presence of viral mutation(s) within the areas targeted by this assay, and inadequate number of viral copies(<138 copies/mL). A negative result must be combined with clinical observations, patient history, and epidemiological information. The expected result is Negative.  Fact Sheet for Patients:  EntrepreneurPulse.com.au  Fact Sheet for Healthcare Providers:  IncredibleEmployment.be  This test is no t yet approved or cleared by the [place] FDA and  has been authorized for detection and/or diagnosis of SARS-CoV-2 by FDA under an Emergency Use Authorization (EUA). This EUA will remain  in effect (meaning this test can be used) for the duration of the COVID-19 declaration under Section 564(b)(1) of the Act, 21 U.S.C.section 360bbb-3(b)(1), unless the authorization is terminated  or revoked sooner.       Influenza A by PCR NEGATIVE NEGATIVE Final   Influenza B by PCR NEGATIVE NEGATIVE Final    Comment: (NOTE) The Xpert Xpress SARS-CoV-2/FLU/RSV plus assay is intended as an aid in the diagnosis of influenza from Nasopharyngeal swab specimens and should not be used as a sole basis for treatment. Nasal washings and aspirates are unacceptable for Xpert Xpress SARS-CoV-2/FLU/RSV testing.  Fact Sheet for Patients: EntrepreneurPulse.com.au  Fact Sheet for Healthcare Providers: IncredibleEmployment.be  This test is not yet approved or  cleared by the [place] FDA and has been authorized for detection and/or diagnosis of SARS-CoV-2 by FDA under an Emergency Use Authorization (EUA). This EUA will remain in effect (meaning this test can be used) for the duration of the COVID-19 declaration under Section 564(b)(1) of the Act, 21 U.S.C. section 360bbb-3(b)(1), unless the authorization is terminated or revoked.  Performed at [facility], [place] [address], [place], [place] [address]   Blood culture (routine x 2)     Status: None (Preliminary result)   Collection  Time: 08/05/21  7:31 PM   Specimen: BLOOD  Result Value Ref Range Status   Specimen Description   Final    BLOOD BLOOD LEFT HAND Performed at [facility] [address] [place], [place] [address]    Special Requests   Final    BOTTLES DRAWN AEROBIC AND ANAEROBIC Blood Culture adequate volume Performed at [facility] [address], [place], [place] [address]    Culture   Final    NO GROWTH 3 DAYS Performed at [facility], [place] [address], [place], [place] [address]    Report Status PENDING  Incomplete  Blood culture (routine x 2)     Status: None (Preliminary result)   Collection Time: 08/05/21  7:40 PM   Specimen: BLOOD  Result Value Ref Range Status   Specimen Description   Final    BLOOD LEFT ANTECUBITAL Performed at [facility] [address] [place], [place] [address]    Special Requests   Final    BOTTLES DRAWN AEROBIC AND ANAEROBIC Blood Culture results may not be optimal due to an excessive volume of blood received in culture bottles Performed at [facility] [address], [place], [place] [address]    Culture   Final    NO GROWTH 3 DAYS Performed at [facility], [place] [address] [place], [place] [address]    Report Status PENDING  Incomplete  Expectorated Sputum Assessment w Gram Stain, Rflx to Resp Cult     Status: None    Collection Time: 08/06/21  3:08 AM   Specimen: Sputum  Result Value Ref Range Status   Specimen Description SPUTUM  Final   Special Requests NONE  Final   Sputum evaluation   Final    Sputum specimen not acceptable for testing.  Please recollect.   Performed at [facility], [place] [address] [place], [place] [address]    Report Status 08/06/2021 FINAL  Final     Labs: BNP (last 3 results) Recent Labs    11/09/20 1328 08/05/21 1818  BNP 247.9* 72.0   Basic Metabolic Panel: Recent Labs  Lab 08/05/21 1818 08/06/21 0325 08/07/21 0546 08/08/21 0619  NA 136 137 139 137  K 4.8 4.5 4.2 3.9  CL 99 99 102 99  CO2 26 29 30  32  GLUCOSE 162* 180* 166* 99  BUN 19 21 22  24*  CREATININE 0.66 0.79 0.78 0.86  CALCIUM 9.1 9.1 8.7* 8.4*   Liver Function Tests: Recent Labs  Lab 08/05/21 1818  AST 35  ALT 22  ALKPHOS 61  BILITOT 1.0  PROT 7.7  ALBUMIN 4.0   Recent Labs  Lab 08/05/21 1818  LIPASE 34   No results for input(s): AMMONIA in the last 168 hours. CBC: Recent Labs  Lab 08/05/21 1818 08/06/21 0325 08/07/21 0546 08/08/21 0619  WBC 9.4 8.5 11.0* 9.1  HGB 13.2 12.7 12.6 12.3  HCT 40.6 39.4 39.6 39.0  MCV 92.3 91.6 92.7 93.8  PLT 254 273 284 266   Cardiac Enzymes: No results for input(s): CKTOTAL, CKMB, CKMBINDEX, TROPONINI in the last 168 hours. BNP: Invalid input(s): POCBNP CBG: Recent Labs  Lab 08/07/21 0814 08/07/21 1118 08/07/21 1701 08/07/21 2053 08/08/21 0744  GLUCAP 121* 179* 126* 163* 95   D-Dimer No results for input(s): DDIMER in the last 72 hours. Hgb A1c Recent Labs    08/06/21 0325  HGBA1C 5.8*   Lipid Profile No results for  input(s): CHOL, HDL, LDLCALC, TRIG, CHOLHDL, LDLDIRECT in the last 72 hours. Thyroid function studies No results for input(s): TSH, T4TOTAL, T3FREE, THYROIDAB in the last 72 hours.  Invalid input(s): FREET3 Anemia work up No results for input(s): VITAMINB12, FOLATE, FERRITIN, TIBC, IRON,  RETICCTPCT in the last 72 hours. Urinalysis No results found for: COLORURINE, APPEARANCEUR, [place], [place], GLUCOSEU, [place], [place], [place], PROTEINUR, UROBILINOGEN, NITRITE, LEUKOCYTESUR Sepsis Labs Invalid input(s): PROCALCITONIN,  WBC,  LACTICIDVEN Microbiology Recent Results (from the past 240 hour(s))  Resp Panel by RT-PCR (Flu A&B, Covid) Nasopharyngeal Swab     Status: None   Collection Time: 08/05/21  7:09 PM   Specimen: Nasopharyngeal Swab; Nasopharyngeal(NP) swabs in vial transport medium  Result Value Ref Range Status   SARS Coronavirus 2 by RT PCR NEGATIVE NEGATIVE Final    Comment: (NOTE) SARS-CoV-2 target nucleic acids are NOT DETECTED.  The SARS-CoV-2 RNA is generally detectable in upper respiratory specimens during the acute phase of infection. The lowest concentration of SARS-CoV-2 viral copies this assay can detect is 138 copies/mL. A negative result does not preclude SARS-Cov-2 infection and should not be used as the sole basis for treatment or other patient management decisions. A negative result may occur with  improper specimen collection/handling, submission of specimen other than nasopharyngeal swab, presence of viral mutation(s) within the areas targeted by this assay, and inadequate number of viral copies(<138 copies/mL). A negative result must be combined with clinical observations, patient history, and epidemiological information. The expected result is Negative.  Fact Sheet for Patients:  EntrepreneurPulse.com.au  Fact Sheet for Healthcare Providers:  IncredibleEmployment.be  This test is no t yet approved or cleared by the [place] FDA and  has been authorized for detection and/or diagnosis of SARS-CoV-2 by FDA under an Emergency Use Authorization (EUA). This EUA will remain  in effect (meaning this test can be used) for the duration of the COVID-19 declaration under Section 564(b)(1) of the Act,  21 U.S.C.section 360bbb-3(b)(1), unless the authorization is terminated  or revoked sooner.       Influenza A by PCR NEGATIVE NEGATIVE Final   Influenza B by PCR NEGATIVE NEGATIVE Final    Comment: (NOTE) The Xpert Xpress SARS-CoV-2/FLU/RSV plus assay is intended as an aid in the diagnosis of influenza from Nasopharyngeal swab specimens and should not be used as a sole basis for treatment. Nasal washings and aspirates are unacceptable for Xpert Xpress SARS-CoV-2/FLU/RSV testing.  Fact Sheet for Patients: EntrepreneurPulse.com.au  Fact Sheet for Healthcare Providers: IncredibleEmployment.be  This test is not yet approved or cleared by the [place] FDA and has been authorized for detection and/or diagnosis of SARS-CoV-2 by FDA under an Emergency Use Authorization (EUA). This EUA will remain in effect (meaning this test can be used) for the duration of the COVID-19 declaration under Section 564(b)(1) of the Act, 21 U.S.C. section 360bbb-3(b)(1), unless the authorization is terminated or revoked.  Performed at [facility], [place] [address], [place], [place] [address]   Blood culture (routine x 2)     Status: None (Preliminary result)   Collection Time: 08/05/21  7:31 PM   Specimen: BLOOD  Result Value Ref Range Status   Specimen Description   Final    BLOOD BLOOD LEFT HAND Performed at [facility] [address] [place], [place] [address]    Special Requests   Final    BOTTLES DRAWN AEROBIC AND ANAEROBIC Blood Culture adequate volume Performed at [facility] [address]  [place], [place] [address]    Culture   Final    NO GROWTH 3 DAYS Performed at [facility], [place] [address], [place], [place] [address]    Report Status PENDING  Incomplete  Blood culture (routine x 2)     Status: None (Preliminary result)   Collection Time: 08/05/21  7:40 PM   Specimen: BLOOD   Result Value Ref Range Status   Specimen Description   Final    BLOOD LEFT ANTECUBITAL Performed at [facility] [address] [place], [place] [address]    Special Requests   Final    BOTTLES DRAWN AEROBIC AND ANAEROBIC Blood Culture results may not be optimal due to an excessive volume of blood received in culture bottles Performed at [facility] [address], [place], [place] [address]    Culture   Final    NO GROWTH 3 DAYS Performed at [facility], [place] [address] [place], [place] [address]    Report Status PENDING  Incomplete  Expectorated Sputum Assessment w Gram Stain, Rflx to Resp Cult     Status: None   Collection Time: 08/06/21  3:08 AM   Specimen: Sputum  Result Value Ref Range Status   Specimen Description SPUTUM  Final   Special Requests NONE  Final   Sputum evaluation   Final    Sputum specimen not acceptable for testing.  Please recollect.   Performed at [facility], [place] [address], [place], [place] [address]    Report Status 08/06/2021 FINAL  Final    SIGNED:   Charlynne Cousins, MD  [facility] 08/08/2021, 9:27 AM Pager   If 7PM-7AM, please contact night-coverage [email or web address] Password [email or web address]

## 2021-08-08 NOTE — Progress Notes (Signed)
SATURATION QUALIFICATIONS: (This note is used to comply with regulatory documentation for home oxygen)  Patient Saturations on Room Air at Rest = n/a   Patient Saturations on Room Air while Ambulating = n/a  Patient Saturations on 2 Liters of oxygen while Ambulating = 91-92%  Please briefly explain why patient needs home oxygen:

## 2021-08-08 NOTE — Progress Notes (Signed)
SATURATION QUALIFICATIONS: (This note is used to comply with regulatory documentation for home oxygen)  Patient Saturations on Room Air at Rest = n/a  Patient Saturations on Room Air while Ambulating = n/a  Patient Saturations on 1 Liters of oxygen while Ambulating = 86-87%  Please briefly explain why patient needs home oxygen: Patient unable to keep oxygen saturation greater than 87% when ambulating with only 1 liter O2.

## 2021-08-09 ENCOUNTER — Telehealth: Payer: Self-pay | Admitting: Internal Medicine

## 2021-08-09 ENCOUNTER — Telehealth (HOSPITAL_COMMUNITY): Payer: Self-pay | Admitting: Family Medicine

## 2021-08-09 NOTE — Telephone Encounter (Signed)
Spoke with pt's daughter, Tandy Gaw [facility]) regarding recent cxr that pt had that noted a lung nodule on CT from 10/2020. She was inquiring about pt being followed by the lung screening program. She reports that pt quit smoking over 20 years ago . I advised her that pt will not qualify since it has been over 15 years since she quit smoking. Pt has a hospital f/u appt with Roxan Diesel, NP on 08/23/21. I advised her to discuss this at the time of this visit to see if she will want to order a regular Chest CT W/O contrast to f/u on this nodule. She verbalized understanding. Nothing further needed at this time.

## 2021-08-10 ENCOUNTER — Encounter (HOSPITAL_COMMUNITY): Payer: Medicare Other

## 2021-08-10 LAB — CULTURE, BLOOD (ROUTINE X 2)
Culture: NO GROWTH
Culture: NO GROWTH
Special Requests: ADEQUATE

## 2021-08-11 NOTE — Progress Notes (Signed)
Cardiac Individual Treatment Plan  Patient Details  Name: Amanda Garrett MRN: [id number] Date of Birth: Apr 13, 1956 Referring Provider:   [name] Pulmonary Rehab Walk Test from 07/23/2021 in [place]  Referring Provider Amanda Garrett       Initial Encounter Date:  Flowsheet Row Pulmonary Rehab Walk Test from 07/23/2021 in [place]  Date 07/23/21       Visit Diagnosis: Stage 3 severe COPD by GOLD classification ([place])  Patient's Home Medications on Admission:  Current Outpatient Medications:    albuterol (PROVENTIL) (2.5 MG/3ML) 0.083% nebulizer solution, Take 2.5 mg by nebulization every 6 (six) hours as needed for wheezing or shortness of breath., Disp: , Rfl:    atorvastatin (LIPITOR) 10 MG tablet, Take 10 mg by mouth daily., Disp: , Rfl:    BAYER LOW DOSE 81 MG EC tablet, Take 81 mg by mouth daily. Swallow whole., Disp: , Rfl:    Calcium-Magnesium-Zinc (CAL-MAG-ZINC PO), Take 1 tablet by mouth daily., Disp: , Rfl:    Cholecalciferol (VITAMIN D-3) 25 MCG (1000 UT) CAPS, Take 1,000 Units by mouth daily., Disp: , Rfl:    clobetasol ointment (TEMOVATE) 6.22 %, Apply 1 application topically daily as needed (in the perineal area)., Disp: , Rfl:    Cyanocobalamin (B-12) 2500 MCG TABS, Take 2,500 mcg by mouth daily., Disp: , Rfl:    doxycycline (VIBRA-TABS) 100 MG tablet, Take 1 tablet (100 mg total) by mouth 2 (two) times daily for 3 days., Disp: 6 tablet, Rfl: 0   famotidine (PEPCID) 20 MG tablet, Take 1 tablet (20 mg total) by mouth 2 (two) times daily as needed for heartburn or indigestion. (Patient taking differently: Take 20 mg by mouth at bedtime.), Disp: 90 tablet, Rfl: 3   Fluticasone-Umeclidin-Vilant (TRELEGY ELLIPTA) 200-62.5-25 MCG/ACT AEPB, INHALE 1 PUFF ONCE DAILY (Patient taking differently: Inhale 1 puff into the lungs daily.), Disp: 60 each, Rfl: 2   guaiFENesin (MUCINEX) 600 MG 12 hr tablet, Take 600 mg by  mouth 2 (two) times daily as needed for cough., Disp: , Rfl:    loratadine (CLARITIN) 10 MG tablet, Take 10 mg by mouth in the morning., Disp: , Rfl:    melatonin 5 MG TABS, Take 2.5 mg by mouth at bedtime as needed (sleep)., Disp: , Rfl:    metFORMIN (GLUCOPHAGE) 850 MG tablet, Take 850 mg by mouth 2 (two) times daily with a meal., Disp: , Rfl:    Misc Natural Products (SAMBUCUS ELDERBERRY IMMUNE) SYRP, Take 5 mLs by mouth daily., Disp: , Rfl:    montelukast (SINGULAIR) 10 MG tablet, Take 10 mg by mouth at bedtime., Disp: , Rfl:    Multiple Vitamins-Minerals (AIRBORNE PO), Take 1 tablet by mouth daily., Disp: , Rfl:    Multiple Vitamins-Minerals (CENTRUM SILVER ULTRA WOMENS) TABS, Take 1 tablet by mouth daily., Disp: , Rfl:    omeprazole (PRILOSEC) 40 MG capsule, Take 1 capsule by mouth once daily (Patient taking differently: Take 40 mg by mouth every evening.), Disp: 30 capsule, Rfl: 0   OXYGEN, Inhale 1 L into the lungs continuous., Disp: , Rfl:    Potassium 99 MG TABS, Take 99 mg by mouth daily., Disp: , Rfl:    predniSONE (DELTASONE) 10 MG tablet, Takes 6 tablets for 1 days, then 5 tablets for 1 days, then 4 tablets for 1 days, then 3 tablets for 1 days, then 2 tabs for 1 days, then 1 tab for 1 days, and then stop., Disp:  21 tablet, Rfl: 0   propranolol (INDERAL) 10 MG tablet, Take 10 mg by mouth 2 (two) times daily., Disp: , Rfl:    Sodium Chloride-Sodium Bicarb (AYR SALINE NASAL RINSE NA), Place 1 spray into the nose daily as needed (sinus rinse)., Disp: , Rfl:    triamcinolone (KENALOG) 0.025 % cream, Apply 1 application topically 2 (two) times daily as needed (rash)., Disp: , Rfl:    VENTOLIN HFA 108 (90 Base) MCG/ACT inhaler, Inhale 2 puffs into the lungs every 6 (six) hours as needed for wheezing or shortness of breath., Disp: , Rfl:    vitamin C (ASCORBIC ACID) 500 MG tablet, Take 500 mg by mouth daily., Disp: , Rfl:   Past Medical History: Past Medical History:  Diagnosis Date    Asthma    COPD (chronic obstructive pulmonary disease) ([place])    Diabetes mellitus without complication (HCC)    Emphysema of lung ([place])    GERD (gastroesophageal reflux disease)    Hypercholesteremia 2011   Hypertension    Lichen sclerosus et atrophicus    vulva   Oxygen deficiency    Palpitation    on Inderal    Tobacco Use: Social History   Tobacco Use  Smoking Status Former   Packs/day: 2.00   Years: 34.00   Pack years: 68.00   Types: Cigarettes   Start date: 81   Quit date: 06/1996   Years since quitting: 25.1  Smokeless Tobacco Never    Labs: Recent Review Scientist, physiological     Labs for ITP Cardiac and Pulmonary Rehab Latest Ref Rng & Units 11/09/2020 11/09/2020 11/09/2020 11/11/2020 08/06/2021   Hemoglobin A1c 4.8 - 5.6 % - 6.2(H) - 6.2(H) 5.8(H)   PHART 7.350 - 7.450 7.251(L) - 7.268(L) - -   PCO2ART 32.0 - 48.0 mmHg 76.6(HH) - 65.3(HH) - -   HCO3 20.0 - 28.0 mmol/L 32.5(H) - 28.9(H) - -   O2SAT % 90.9 - 93.4 - -       Capillary Blood Glucose: Lab Results  Component Value Date   GLUCAP 182 (H) 08/08/2021   GLUCAP 95 08/08/2021   GLUCAP 163 (H) 08/07/2021   GLUCAP 126 (H) 08/07/2021   GLUCAP 179 (H) 08/07/2021     Exercise Target Goals: Exercise Program Goal: Individual exercise prescription set using results from initial 6 min walk test and THRR while considering  patients activity barriers and safety.   Exercise Prescription Goal: Initial exercise prescription builds to 30-45 minutes a day of aerobic activity, 2-3 days per week.  Home exercise guidelines will be given to patient during program as part of exercise prescription that the participant will acknowledge.  Activity Barriers & Risk Stratification:  Activity Barriers & Cardiac Risk Stratification - 07/23/21 1350       Activity Barriers & Cardiac Risk Stratification   Activity Barriers Arthritis;Deconditioning;Muscular Weakness;Shortness of Breath;History of Falls    Cardiac Risk  Stratification Low             6 Minute Walk:  6 Minute Walk     Row Name 07/23/21 1451         6 Minute Walk   Phase Initial     Distance 850 feet     Walk Time 6 minutes     # of Rest Breaks 1  4:00-4:47     MPH 1.61     METS 2.02     RPE 12     Perceived Dyspnea  1     VO2 Peak  7.06     Symptoms No     Resting HR 69 bpm     Resting BP 158/70     Resting Oxygen Saturation  97 %     Exercise Oxygen Saturation  during 6 min walk 87 %     Max Ex. HR 111 bpm     Max Ex. BP 170/88     2 Minute Post BP 150/80       Interval HR   1 Minute HR 88     2 Minute HR 100     3 Minute HR 108     4 Minute HR 111     5 Minute HR 99     6 Minute HR 107     2 Minute Post HR 90     Interval Heart Rate? Yes       Interval Oxygen   Interval Oxygen? Yes     Baseline Oxygen Saturation % 97 %     1 Minute Oxygen Saturation % 94 %     1 Minute Liters of Oxygen 1 L     2 Minute Oxygen Saturation % 90 %     2 Minute Liters of Oxygen 1 L     3 Minute Oxygen Saturation % 89 %  3:50: 87%     3 Minute Liters of Oxygen 1 L  Increased to 2L     4 Minute Oxygen Saturation % 88 %     4 Minute Liters of Oxygen 2 L     5 Minute Oxygen Saturation % 95 %     5 Minute Liters of Oxygen 2 L     6 Minute Oxygen Saturation % 94 %     6 Minute Liters of Oxygen 2 L     2 Minute Post Oxygen Saturation % 97 %     2 Minute Post Liters of Oxygen 2 L              Oxygen Initial Assessment:  Oxygen Initial Assessment - 07/23/21 1346       Home Oxygen   Home Oxygen Device E-Tanks;Home Concentrator   DME: [employer]   Sleep Oxygen Prescription Continuous    Liters per minute 1    Home Exercise Oxygen Prescription Continuous    Liters per minute 1    Home Resting Oxygen Prescription Continuous    Liters per minute 1    Compliance with Home Oxygen Use Yes      Initial 6 min Walk   Oxygen Used Continuous    Liters per minute 2      Program Oxygen Prescription   Program Oxygen Prescription  Continuous    Liters per minute 2      Intervention   Short Term Goals To learn and exhibit compliance with exercise, home and travel O2 prescription;To learn and understand importance of monitoring SPO2 with pulse oximeter and demonstrate accurate use of the pulse oximeter.;To learn and understand importance of maintaining oxygen saturations>88%;To learn and demonstrate proper pursed lip breathing techniques or other breathing techniques. ;To learn and demonstrate proper use of respiratory medications    Long  Term Goals Exhibits compliance with exercise, home  and travel O2 prescription;Verbalizes importance of monitoring SPO2 with pulse oximeter and return demonstration;Maintenance of O2 saturations>88%;Exhibits proper breathing techniques, such as pursed lip breathing or other method taught during program session;Compliance with respiratory medication;Demonstrates proper use of MDIs  Oxygen Re-Evaluation:  Oxygen Re-Evaluation     Row Name 08/02/21 1149             Program Oxygen Prescription   Program Oxygen Prescription Continuous       Liters per minute 1       Comments O2 sat monitored and stable during exercise, 94% on 1L. O2 decreased to 1L.         Home Oxygen   Home Oxygen Device E-Tanks;Home Concentrator  DME: [employer]       Sleep Oxygen Prescription Continuous       Liters per minute 1       Home Exercise Oxygen Prescription Continuous       Liters per minute 1       Home Resting Oxygen Prescription Continuous       Liters per minute 1       Compliance with Home Oxygen Use Yes         Goals/Expected Outcomes   Short Term Goals To learn and exhibit compliance with exercise, home and travel O2 prescription;To learn and understand importance of monitoring SPO2 with pulse oximeter and demonstrate accurate use of the pulse oximeter.;To learn and understand importance of maintaining oxygen saturations>88%;To learn and demonstrate proper pursed lip breathing  techniques or other breathing techniques. ;To learn and demonstrate proper use of respiratory medications       Long  Term Goals Exhibits compliance with exercise, home  and travel O2 prescription;Verbalizes importance of monitoring SPO2 with pulse oximeter and return demonstration;Maintenance of O2 saturations>88%;Exhibits proper breathing techniques, such as pursed lip breathing or other method taught during program session;Compliance with respiratory medication;Demonstrates proper use of MDIs       Goals/Expected Outcomes Compliance and understanding of oxygen saturation and breathing techniques to decrease shortness of breath                Oxygen Discharge (Final Oxygen Re-Evaluation):  Oxygen Re-Evaluation - 08/02/21 1149       Program Oxygen Prescription   Program Oxygen Prescription Continuous    Liters per minute 1    Comments O2 sat monitored and stable during exercise, 94% on 1L. O2 decreased to 1L.      Home Oxygen   Home Oxygen Device E-Tanks;Home Concentrator   DME: [employer]   Sleep Oxygen Prescription Continuous    Liters per minute 1    Home Exercise Oxygen Prescription Continuous    Liters per minute 1    Home Resting Oxygen Prescription Continuous    Liters per minute 1    Compliance with Home Oxygen Use Yes      Goals/Expected Outcomes   Short Term Goals To learn and exhibit compliance with exercise, home and travel O2 prescription;To learn and understand importance of monitoring SPO2 with pulse oximeter and demonstrate accurate use of the pulse oximeter.;To learn and understand importance of maintaining oxygen saturations>88%;To learn and demonstrate proper pursed lip breathing techniques or other breathing techniques. ;To learn and demonstrate proper use of respiratory medications    Long  Term Goals Exhibits compliance with exercise, home  and travel O2 prescription;Verbalizes importance of monitoring SPO2 with pulse oximeter and return demonstration;Maintenance of  O2 saturations>88%;Exhibits proper breathing techniques, such as pursed lip breathing or other method taught during program session;Compliance with respiratory medication;Demonstrates proper use of MDIs    Goals/Expected Outcomes Compliance and understanding of oxygen saturation and breathing techniques to decrease shortness of breath  Initial Exercise Prescription:  Initial Exercise Prescription - 07/23/21 1400       Date of Initial Exercise RX and Referring Provider   Date 07/23/21    Referring Provider Amanda Garrett    Expected Discharge Date 09/23/21      Oxygen   Oxygen Continuous    Liters 2    Maintain Oxygen Saturation 88% or higher      NuStep   Level 1    SPM 60    Minutes 30      Prescription Details   Frequency (times per week) 2    Duration Progress to 30 minutes of continuous aerobic without signs/symptoms of physical distress      Intensity   THRR 40-80% of Max Heartrate 61-122    Ratings of Perceived Exertion 11-13    Perceived Dyspnea 0-4      Progression   Progression Continue to progress workloads to maintain intensity without signs/symptoms of physical distress.      Resistance Training   Training Prescription Yes    Weight red bands    Reps 10-15             Perform Capillary Blood Glucose checks as needed.  Exercise Prescription Changes:   Exercise Prescription Changes     Row Name 07/29/21 1544             Response to Exercise   Blood Pressure (Admit) 130/82       Blood Pressure (Exercise) 144/80       Blood Pressure (Exit) 118/60       Heart Rate (Admit) 86 bpm       Heart Rate (Exercise) 92 bpm       Heart Rate (Exit) 76 bpm       Oxygen Saturation (Admit) 94 %       Oxygen Saturation (Exercise) 94 %       Oxygen Saturation (Exit) 95 %       Rating of Perceived Exertion (Exercise) 9       Perceived Dyspnea (Exercise) 1       Duration Progress to 30 minutes of  aerobic without signs/symptoms of physical distress        Intensity --  40-80% HRR         Resistance Training   Training Prescription Yes       Weight red bands       Reps 10-15       Time 10 Minutes         Oxygen   Oxygen Continuous       Liters 1         NuStep   Level 1       SPM 60       Minutes 30       METs 1.7                Exercise Comments:   Exercise Comments     Row Name 07/27/21 1620           Exercise Comments Pt completed first day of exercise. Amanda Garrett exercised for 30 min on the Nustep at level 1. She averaged 1.6 METs. Amanda Garrett performed the warmup and cooldown standing without limitations. I asked how she felt while exercising. She stated that she noticed how deconditioned she is and hopes to improve.                Exercise Goals and Review:   Exercise Goals  [place] Name 07/23/21 1455 08/02/21 1144           Exercise Goals   Increase Physical Activity Yes Yes      Intervention Provide advice, education, support and counseling about physical activity/exercise needs.;Develop an individualized exercise prescription for aerobic and resistive training based on initial evaluation findings, risk stratification, comorbidities and participant's personal goals. Provide advice, education, support and counseling about physical activity/exercise needs.;Develop an individualized exercise prescription for aerobic and resistive training based on initial evaluation findings, risk stratification, comorbidities and participant's personal goals.      Expected Outcomes Short Term: Attend rehab on a regular basis to increase amount of physical activity.;Long Term: Add in home exercise to make exercise part of routine and to increase amount of physical activity.;Long Term: Exercising regularly at least 3-5 days a week. Short Term: Attend rehab on a regular basis to increase amount of physical activity.;Long Term: Add in home exercise to make exercise part of routine and to increase amount of physical activity.;Long Term:  Exercising regularly at least 3-5 days a week.      Increase Strength and Stamina Yes Yes      Intervention Provide advice, education, support and counseling about physical activity/exercise needs.;Develop an individualized exercise prescription for aerobic and resistive training based on initial evaluation findings, risk stratification, comorbidities and participant's personal goals. Provide advice, education, support and counseling about physical activity/exercise needs.;Develop an individualized exercise prescription for aerobic and resistive training based on initial evaluation findings, risk stratification, comorbidities and participant's personal goals.      Expected Outcomes Short Term: Perform resistance training exercises routinely during rehab and add in resistance training at home;Short Term: Increase workloads from initial exercise prescription for resistance, speed, and METs.;Long Term: Improve cardiorespiratory fitness, muscular endurance and strength as measured by increased METs and functional capacity (6MWT) Short Term: Perform resistance training exercises routinely during rehab and add in resistance training at home;Short Term: Increase workloads from initial exercise prescription for resistance, speed, and METs.;Long Term: Improve cardiorespiratory fitness, muscular endurance and strength as measured by increased METs and functional capacity (6MWT)      Able to understand and use rate of perceived exertion (RPE) scale Yes Yes      Intervention Provide education and explanation on how to use RPE scale Provide education and explanation on how to use RPE scale      Expected Outcomes Short Term: Able to use RPE daily in rehab to express subjective intensity level;Long Term:  Able to use RPE to guide intensity level when exercising independently Short Term: Able to use RPE daily in rehab to express subjective intensity level;Long Term:  Able to use RPE to guide intensity level when exercising  independently      Able to understand and use Dyspnea scale Yes Yes      Intervention Provide education and explanation on how to use Dyspnea scale Provide education and explanation on how to use Dyspnea scale      Expected Outcomes Short Term: Able to use Dyspnea scale daily in rehab to express subjective sense of shortness of breath during exertion;Long Term: Able to use Dyspnea scale to guide intensity level when exercising independently Short Term: Able to use Dyspnea scale daily in rehab to express subjective sense of shortness of breath during exertion;Long Term: Able to use Dyspnea scale to guide intensity level when exercising independently      Knowledge and understanding of Target Heart Rate Range (THRR) Yes Yes      Intervention  Provide education and explanation of THRR including how the numbers were predicted and where they are located for reference Provide education and explanation of THRR including how the numbers were predicted and where they are located for reference      Expected Outcomes Short Term: Able to state/look up THRR;Long Term: Able to use THRR to govern intensity when exercising independently;Short Term: Able to use daily as guideline for intensity in rehab Short Term: Able to state/look up THRR;Long Term: Able to use THRR to govern intensity when exercising independently;Short Term: Able to use daily as guideline for intensity in rehab      Understanding of Exercise Prescription Yes Yes      Intervention Provide education, explanation, and written materials on patient's individual exercise prescription Provide education, explanation, and written materials on patient's individual exercise prescription      Expected Outcomes Short Term: Able to explain program exercise prescription;Long Term: Able to explain home exercise prescription to exercise independently Short Term: Able to explain program exercise prescription;Long Term: Able to explain home exercise prescription to  exercise independently               Exercise Goals Re-Evaluation :  Exercise Goals Re-Evaluation     Row Name 08/02/21 1144             Exercise Goal Re-Evaluation   Exercise Goals Review Increase Physical Activity;Increase Strength and Stamina;Able to understand and use rate of perceived exertion (RPE) scale;Able to understand and use Dyspnea scale;Knowledge and understanding of Target Heart Rate Range (THRR);Understanding of Exercise Prescription       Comments Amanda Garrett has completed 2 exercise sessions Amanda Garrett exercises for 30 min on the Nustep at level 1. She averages 1.7 METs. Amanda Garrett performed the warmup and cooldown standing without limitations. She is very deconditioned and she knows this. Her hope is to improve her functional capacity. She is motivated to exercise. I discussed with her possibly moving to the track for 10 min in the future. She agreed with my recommendations. It is too soon to note and discernable progressions. Will continue to monitor and progress as able.       Expected Outcomes Through exercise at rehab and home, the patient will decrease shortness of breath with daily activities and feel confident in carrying out an exercise regimen at home.                Discharge Exercise Prescription (Final Exercise Prescription Changes):  Exercise Prescription Changes - 07/29/21 1544       Response to Exercise   Blood Pressure (Admit) 130/82    Blood Pressure (Exercise) 144/80    Blood Pressure (Exit) 118/60    Heart Rate (Admit) 86 bpm    Heart Rate (Exercise) 92 bpm    Heart Rate (Exit) 76 bpm    Oxygen Saturation (Admit) 94 %    Oxygen Saturation (Exercise) 94 %    Oxygen Saturation (Exit) 95 %    Rating of Perceived Exertion (Exercise) 9    Perceived Dyspnea (Exercise) 1    Duration Progress to 30 minutes of  aerobic without signs/symptoms of physical distress    Intensity --   40-80% HRR     Resistance Training   Training Prescription Yes    Weight  red bands    Reps 10-15    Time 10 Minutes      Oxygen   Oxygen Continuous    Liters 1      NuStep   Level 1  SPM 60    Minutes 30    METs 1.7             Nutrition:  Target Goals: Understanding of nutrition guidelines, daily intake of sodium '1500mg'$ , cholesterol '200mg'$ , calories 30% from fat and 7% or less from saturated fats, daily to have 5 or more servings of fruits and vegetables.  Biometrics:  Pre Biometrics - 07/23/21 1313       Pre Biometrics   Grip Strength 17 kg              Nutrition Therapy Plan and Nutrition Goals:   Nutrition Assessments:  MEDIFICTS Score Key: ?70 Need to make dietary changes  40-70 Heart Healthy Diet ? 40 Therapeutic Level Cholesterol Diet    Picture Your Plate Scores: <16 Unhealthy dietary pattern with much room for improvement. 41-50 Dietary pattern unlikely to meet recommendations for good health and room for improvement. 51-60 More healthful dietary pattern, with some room for improvement.  >60 Healthy dietary pattern, although there may be some specific behaviors that could be improved.    Nutrition Goals Re-Evaluation:   Nutrition Goals Re-Evaluation:   Nutrition Goals Discharge (Final Nutrition Goals Re-Evaluation):   Psychosocial: Target Goals: Acknowledge presence or absence of significant depression and/or stress, maximize coping skills, provide positive support system. Participant is able to verbalize types and ability to use techniques and skills needed for reducing stress and depression.  Initial Review & Psychosocial Screening:  Initial Psych Review & Screening - 07/23/21 1336       Initial Review   Current issues with None Identified      Family Dynamics   Good Support System? Yes    Comments family      Barriers   Psychosocial barriers to participate in program There are no identifiable barriers or psychosocial needs.      Screening Interventions   Interventions Encouraged to exercise              Quality of Life Scores:  Scores of 19 and below usually indicate a poorer quality of life in these areas.  A difference of  2-3 points is a clinically meaningful difference.  A difference of 2-3 points in the total score of the Quality of Life Index has been associated with significant improvement in overall quality of life, self-image, physical symptoms, and general health in studies assessing change in quality of life.  PHQ-9: Recent Review Flowsheet Data     Depression screen [facility] 2/9 07/23/2021   Decreased Interest 1   Down, Depressed, Hopeless 1   PHQ - 2 Score 2   Altered sleeping 1    Tired, decreased energy 1   Change in appetite 0   Feeling bad or failure about yourself  0   Trouble concentrating 0   Moving slowly or fidgety/restless 0   Suicidal thoughts 0   PHQ-9 Score 4   Difficult doing work/chores Somewhat difficult      Interpretation of Total Score  Total Score Depression Severity:  1-4 = Minimal depression, 5-9 = Mild depression, 10-14 = Moderate depression, 15-19 = Moderately severe depression, 20-27 = Severe depression   Psychosocial Evaluation and Intervention:  Psychosocial Evaluation - 07/23/21 1339       Psychosocial Evaluation & Interventions   Interventions Encouraged to exercise with the program and follow exercise prescription    Expected Outcomes For Aysha to participate in Pulmonary Rehab    Continue Psychosocial Services  Follow up required by staff  Psychosocial Re-Evaluation:  Psychosocial Re-Evaluation     [place] Name 08/11/21 1150             Psychosocial Re-Evaluation   Current issues with None Identified       Comments Amanda Garrett has attended only 2 exercise sessions thus far. She was absent due to breathing difficulties, then was admitted to the hospital 08/05/21 through 08/08/21  with COPD exacerbation. I am unable to re evaluate her psychosocial barriers at this time since she has not returned.        Expected Outcomes For Amanda Garrett to attend PR without psychosocial barriers.       Interventions Encouraged to attend Pulmonary Rehabilitation for the exercise       Continue Psychosocial Services  No Follow up required                Psychosocial Discharge (Final Psychosocial Re-Evaluation):  Psychosocial Re-Evaluation - 08/11/21 1150       Psychosocial Re-Evaluation   Current issues with None Identified    Comments Amanda Garrett has attended only 2 exercise sessions thus far. She was absent due to breathing difficulties, then was admitted to the hospital 08/05/21 through 08/08/21  with COPD exacerbation. I am unable to re evaluate her psychosocial barriers at this time since she has not returned.    Expected Outcomes For Amanda Garrett to attend PR without psychosocial barriers.    Interventions Encouraged to attend Pulmonary Rehabilitation for the exercise    Continue Psychosocial Services  No Follow up required             Vocational Rehabilitation: Provide vocational rehab assistance to qualifying candidates.   Vocational Rehab Evaluation & Intervention:   Education: Education Goals: Education classes will be provided on a weekly basis, covering required topics. Participant will state understanding/return demonstration of topics presented.  Learning Barriers/Preferences:  Learning Barriers/Preferences - 07/23/21 1344       Learning Barriers/Preferences   Learning Barriers None    Learning Preferences Individual Instruction;Computer/Internet;Pictoral;Written Material;Video;Verbal Instruction             Education Topics: Count Your Pulse:  -Group instruction provided by verbal instruction, demonstration, patient participation and written materials to support subject.  Instructors address importance of being able to find your pulse and how to count your pulse when at home without a heart monitor.  Patients get hands on experience counting their pulse with staff help and  individually.   Heart Attack, Angina, and Risk Factor Modification:  -Group instruction provided by verbal instruction, video, and written materials to support subject.  Instructors address signs and symptoms of angina and heart attacks.    Also discuss risk factors for heart disease and how to make changes to improve heart health risk factors.   Functional Fitness:  -Group instruction provided by verbal instruction, demonstration, patient participation, and written materials to support subject.  Instructors address safety measures for doing things around the house.  Discuss how to get up and down off the floor, how to pick things up properly, how to safely get out of a chair without assistance, and balance training.   Meditation and Mindfulness:  -Group instruction provided by verbal instruction, patient participation, and written materials to support subject.  Instructor addresses importance of mindfulness and meditation practice to help reduce stress and improve awareness.  Instructor also leads participants through a meditation exercise.    Stretching for Flexibility and Mobility:  -Group instruction provided by verbal instruction, patient participation, and written materials to support subject.  Instructors lead participants through series of stretches that are designed to increase flexibility thus improving mobility.  These stretches are additional exercise for major muscle groups that are typically performed during regular warm up and cool down.   Hands Only CPR:  -Group verbal, video, and participation provides a basic overview of AHA guidelines for community CPR. Role-play of emergencies allow participants the opportunity to practice calling for help and chest compression technique with discussion of AED use.   Hypertension: -Group verbal and written instruction that provides a basic overview of hypertension including the most recent diagnostic guidelines, risk factor reduction with  self-care instructions and medication management.    Nutrition I class: Heart Healthy Eating:  -Group instruction provided by PowerPoint slides, verbal discussion, and written materials to support subject matter. The instructor gives an explanation and review of the Therapeutic Lifestyle Changes diet recommendations, which includes a discussion on lipid goals, dietary fat, sodium, fiber, plant stanol/sterol esters, sugar, and the components of a well-balanced, healthy diet.   Nutrition II class: Lifestyle Skills:  -Group instruction provided by PowerPoint slides, verbal discussion, and written materials to support subject matter. The instructor gives an explanation and review of label reading, grocery shopping for heart health, heart healthy recipe modifications, and ways to make healthier choices when eating out.   Diabetes Question & Answer:  -Group instruction provided by PowerPoint slides, verbal discussion, and written materials to support subject matter. The instructor gives an explanation and review of diabetes co-morbidities, pre- and post-prandial blood glucose goals, pre-exercise blood glucose goals, signs, symptoms, and treatment of hypoglycemia and hyperglycemia, and foot care basics.   Diabetes Blitz:  -Group instruction provided by PowerPoint slides, verbal discussion, and written materials to support subject matter. The instructor gives an explanation and review of the physiology behind type 1 and type 2 diabetes, diabetes medications and rational behind using different medications, pre- and post-prandial blood glucose recommendations and Hemoglobin A1c goals, diabetes diet, and exercise including blood glucose guidelines for exercising safely.    Portion Distortion:  -Group instruction provided by PowerPoint slides, verbal discussion, written materials, and food models to support subject matter. The instructor gives an explanation of serving size versus portion size, changes in  portions sizes over the last 20 years, and what consists of a serving from each food group.   Stress Management:  -Group instruction provided by verbal instruction, video, and written materials to support subject matter.  Instructors review role of stress in heart disease and how to cope with stress positively.     Exercising on Your Own:  -Group instruction provided by verbal instruction, power point, and written materials to support subject.  Instructors discuss benefits of exercise, components of exercise, frequency and intensity of exercise, and end points for exercise.  Also discuss use of nitroglycerin and activating EMS.  Review options of places to exercise outside of rehab.  Review guidelines for sex with heart disease.   Cardiac Drugs I:  -Group instruction provided by verbal instruction and written materials to support subject.  Instructor reviews cardiac drug classes: antiplatelets, anticoagulants, beta blockers, and statins.  Instructor discusses reasons, side effects, and lifestyle considerations for each drug class.   Cardiac Drugs II:  -Group instruction provided by verbal instruction and written materials to support subject.  Instructor reviews cardiac drug classes: angiotensin converting enzyme inhibitors (ACE-I), angiotensin II receptor blockers (ARBs), nitrates, and calcium channel blockers.  Instructor discusses reasons, side effects, and lifestyle considerations for each drug class.   Anatomy and  Physiology of the Circulatory System:  Group verbal and written instruction and models provide basic cardiac anatomy and physiology, with the coronary electrical and arterial systems. Review of: AMI, Angina, Valve disease, Heart Failure, Peripheral Artery Disease, Cardiac Arrhythmia, Pacemakers, and the ICD.   Other Education:  -Group or individual verbal, written, or video instructions that support the educational goals of the cardiac rehab program. Flowsheet Row PULMONARY  REHAB CHRONIC OBSTRUCTIVE PULMONARY DISEASE from 07/29/2021 in [facility]  Date 07/29/21  Educator My[employer] H/O  Instruction Review Code 1- Verbalizes Understanding       Holiday Eating Survival Tips:  -Group instruction provided by [employer], verbal discussion, and written materials to support subject matter. The instructor gives patients tips, tricks, and techniques to help them not only survive but enjoy the holidays despite the onslaught of food that accompanies the holidays.   Knowledge Questionnaire Score:  Knowledge Questionnaire Score - 07/23/21 1357       Knowledge Questionnaire Score   Pre Score 15/18             Core Components/Risk Factors/Patient Goals at Admission:  Personal Goals and Risk Factors at Admission - 07/23/21 1340       Core Components/Risk Factors/Patient Goals on Admission    Weight Management Weight Loss;Yes    Intervention Weight Management: Provide education and appropriate resources to help participant work on and attain dietary goals.;Weight Management/Obesity: Establish reasonable short term and long term weight goals.;Weight Management: Develop a combined nutrition and exercise program designed to reach desired caloric intake, while maintaining appropriate intake of nutrient and fiber, sodium and fats, and appropriate energy expenditure required for the weight goal.;Obesity: Provide education and appropriate resources to help participant work on and attain dietary goals.    Expected Outcomes Weight Loss: Understanding of general recommendations for a balanced deficit meal plan, which promotes 1-2 lb weight loss per week and includes a negative energy balance of [phone] kcal/d;Short Term: Continue to assess and modify interventions until short term weight is achieved;Long Term: Adherence to nutrition and physical activity/exercise program aimed toward attainment of established weight goal;Weight Maintenance:  Understanding of the daily nutrition guidelines, which includes 25-35% calories from fat, 7% or less cal from saturated fats, less than $RemoveB'200mg'aiZEedIL$  cholesterol, less than 1.5gm of sodium, & 5 or more servings of fruits and vegetables daily;Understanding recommendations for meals to include 15-35% energy as protein, 25-35% energy from fat, 35-60% energy from carbohydrates, less than $RemoveB'200mg'JnkHOGhX$  of dietary cholesterol, 20-35 gm of total fiber daily;Understanding of distribution of calorie intake throughout the day with the consumption of 4-5 meals/snacks;Weight Gain: Understanding of general recommendations for a high calorie, high protein meal plan that promotes weight gain by distributing calorie intake throughout the day with the consumption for 4-5 meals, snacks, and/or supplements    Improve shortness of breath with ADL's Yes    Intervention Provide education, individualized exercise plan and daily activity instruction to help decrease symptoms of SOB with activities of daily living.    Expected Outcomes Short Term: Improve cardiorespiratory fitness to achieve a reduction of symptoms when performing ADLs;Long Term: Be able to perform more ADLs without symptoms or delay the onset of symptoms    Increase knowledge of respiratory medications and ability to use respiratory devices properly  Yes    Intervention Provide education and demonstration as needed of appropriate use of medications, inhalers, and oxygen therapy.    Expected Outcomes Short Term: Achieves understanding of medications use. Understands that oxygen  is a medication prescribed by physician. Demonstrates appropriate use of inhaler and oxygen therapy.;Long Term: Maintain appropriate use of medications, inhalers, and oxygen therapy.             Core Components/Risk Factors/Patient Goals Review:   Goals and Risk Factor Review     Row Name 08/11/21 1156             Core Components/Risk Factors/Patient Goals Review   Personal Goals Review  Weight Management/Obesity;Improve shortness of breath with ADL's;Develop more efficient breathing techniques such as purse lipped breathing and diaphragmatic breathing and practicing self-pacing with activity.;Increase knowledge of respiratory medications and ability to use respiratory devices properly.       Review I am unable to reevaluate her core components due to her only attendig 2 sessions thus far. She has been hospitalized with COPD exacerbation and has not returned yet.       Expected Outcomes Weight loss, improved SOB with ADL's and more efficient breathing techniques with her return and progress with exercise.                Core Components/Risk Factors/Patient Goals at Discharge (Final Review):   Goals and Risk Factor Review - 08/11/21 1156       Core Components/Risk Factors/Patient Goals Review   Personal Goals Review Weight Management/Obesity;Improve shortness of breath with ADL's;Develop more efficient breathing techniques such as purse lipped breathing and diaphragmatic breathing and practicing self-pacing with activity.;Increase knowledge of respiratory medications and ability to use respiratory devices properly.    Review I am unable to reevaluate her core components due to her only attendig 2 sessions thus far. She has been hospitalized with COPD exacerbation and has not returned yet.    Expected Outcomes Weight loss, improved SOB with ADL's and more efficient breathing techniques with her return and progress with exercise.             ITP Comments:   Comments: ITP REVIEW Pt is making expected progress toward pulmonary rehab goals after completing 2 sessions. Recommend continued exercise, life style modification, education, and utilization of breathing techniques to increase stamina and strength and decrease shortness of breath with exertion.  Dr. Rodman Pickle is Medical Director for Pulmonary Rehab at [facility].

## 2021-08-12 ENCOUNTER — Encounter (HOSPITAL_COMMUNITY): Admission: RE | Admit: 2021-08-12 | Payer: Medicare Other | Source: Ambulatory Visit

## 2021-08-12 ENCOUNTER — Telehealth (HOSPITAL_COMMUNITY): Payer: Self-pay | Admitting: Family Medicine

## 2021-08-17 ENCOUNTER — Telehealth: Payer: Self-pay | Admitting: Internal Medicine

## 2021-08-17 ENCOUNTER — Encounter (HOSPITAL_COMMUNITY): Payer: Medicare Other

## 2021-08-17 ENCOUNTER — Telehealth (HOSPITAL_COMMUNITY): Payer: Self-pay

## 2021-08-17 NOTE — Telephone Encounter (Signed)
Called Amanda Garrett to check on her. She stated that she has not been feeling well. I mentioned to her that she may have to be discharge if she cannot come back. She voiced understanding. She would like to wait till she sees the pulmonologist for further advice.  ?

## 2021-08-18 NOTE — Telephone Encounter (Signed)
ATC x1.  No answer.  LVM to return call.

## 2021-08-18 NOTE — Telephone Encounter (Signed)
Patient's daughter, Tandy Gaw called back.  Hgb is 10.2  showed up after 1 wk after she left hospital.  The area is tender and the bruising is shifting [place], below belly button and getting wider.  Top part is lighter and the bottom part is new and darker.  Bruises are not connected.  She saw her pcp, daughter is not a fan and is looking for a new pcp.  The PCP felt it was d/t the shots as well.  No redness reported, unsure about temperature change as patient and daughter always have cold hands.  She has pictures of the area, however, her mom does not have a portal set up and does not want one set up because she is not computer savvy.  She may just set up the portal so she can sent [place] the pictures so we can visualize what she is trying to describe or get them to [place] another way.  She has a f/u with Katie on Monday, 3/13 and this is the first available.  Advised that if she gets worse (area becomes red, warm to touch) to call us back or get her to the ED if necessary or to an urgent care if not an emergency.  She verbalized understanding.  I am going to leave open in case she calls back. ?

## 2021-08-18 NOTE — Telephone Encounter (Signed)
I would assume this is related to lovenox injections. Her hemoglobin on 10 days ago was normal which is reassuring. Sometimes bruising will spread and change color as the body breaks down the blood. Nothing to do unless she is noticing worsening warmth, swelling, and tenderness to the area. ?

## 2021-08-18 NOTE — Telephone Encounter (Signed)
Called and spoke with pt's daughter [place]. Tandy Gaw stated that pt has bruising on front side of body which goes from her breasts all the way down to her belly button. She stated that pt was recently in the hospital 2/23-2/26 and while pt was in the hospital, pt was given Lovenox injections but she states that she can see where the Lovenox injections were done and the bruising does not add up with where the injections were given. ? ?[place] did state that pt woke up at 2am Friday 3/3 from a deep sleep having pain in her stomach which made her feel nauseous and pt also was shaking real bad. She stated that pt did not check her abdomen when this happened so does not know if pt had any bruising at that time or not. She states that they were made aware of bruising on 3/5 by pt but states that the bruising has spread since then. ? ?Asked Tandy Gaw if they had notified PCP about the bruising and she said that they did but PCP did not say/do anything about it and due to that is why they have called our office for recommendations. Tandy Gaw stated that pt is still taking ASA. ? ?Dr. [name], please advise on any recommendations for pt and also if you want this to routed to PCP due to concerns from family. ?

## 2021-08-19 ENCOUNTER — Telehealth (HOSPITAL_COMMUNITY): Payer: Self-pay | Admitting: *Deleted

## 2021-08-19 ENCOUNTER — Encounter (HOSPITAL_COMMUNITY): Admission: RE | Admit: 2021-08-19 | Payer: Medicare Other | Source: Ambulatory Visit

## 2021-08-19 NOTE — Telephone Encounter (Signed)
Called Amanda Garrett to check on her since her she was not at PR today. She states that she is still not feeling well and that she is having a hard time getting over her recent hospitalization of COPD exacerbation.She has not been at the [place] program since 07/29/21. We discussed that she would not get the benefit from coming to the program with her absences. She voices understanding and agrees with the plan to discharge from the program. I encouraged her to discuss with Dr. Edwinna Areola when she was ready to return and have her to send another referral. ?

## 2021-08-19 NOTE — Addendum Note (Signed)
Encounter addended by: Deon Pilling, RN on: 08/19/2021 4:44 PM  Actions taken: Visit diagnoses modified

## 2021-08-19 NOTE — Addendum Note (Signed)
Encounter addended by: Deon Pilling, RN on: 08/19/2021 4:42 PM  Actions taken: Clinical Note Signed

## 2021-08-19 NOTE — Addendum Note (Signed)
Encounter addended by: Sheppard Plumber on: 08/19/2021 12:09 PM  Actions taken: Flowsheet data copied forward, Flowsheet accepted

## 2021-08-19 NOTE — Progress Notes (Signed)
Discharge Progress Report ° °Patient Details  °Name: Amanda Garrett °MRN: [id number] °Date of Birth: 11/09/1955 °Referring Provider:   °Flowsheet Row Pulmonary Rehab Walk Test from 07/23/2021 in [facility]  °Referring Provider Desai  ° °  ° ° ° °Number of Visits: 2 ° °Reason for Discharge: Early exit °Amanda Garrett has been able to attend only two visits to the PR program. She has a COPD exacerbation and was hospitalized. She has had a difficult time recovering from this and has not been able to return. I discussed with her discharging her from the program and she agreered to this. I encouraged her to discuss this with Dr. Desi when she has recovered and she plans to get another referral. ° °Smoking History:  °Social History  ° °Tobacco Use  °Smoking Status Former  ° Packs/day: 2.00  ° Years: 34.00  ° Pack years: 68.00  ° Types: Cigarettes  ° Start date: 1965  ° Quit date: 06/1996  ° Years since quitting: 25.2  °Smokeless Tobacco Never  ° ° °Diagnosis:  °Stage 3 severe COPD by GOLD classification (HCC) ° °ADL [facility]: ° Pulmonary Assessment Scores   ° ° Row Name 07/23/21 1357  °  °  °  ° ADL [facility]  ° ADL Phase Entry    ° SOB Score total 63    °  ° CAT Score  ° CAT Score 16    °  ° mMRC Score  ° mMRC Score 1    ° °  °  ° °  ° ° °Initial Exercise Prescription: ° Initial Exercise Prescription - 07/23/21 1400   ° °  ° Date of Initial Exercise RX and Referring Provider  ° Date 07/23/21   ° Referring Provider Desai   ° Expected Discharge Date 09/23/21   °  ° Oxygen  ° Oxygen Continuous   ° Liters 2   ° Maintain Oxygen Saturation 88% or higher   °  ° NuStep  ° Level 1   ° SPM 60   ° Minutes 30   °  ° Prescription Details  ° Frequency (times per week) 2   ° Duration Progress to 30 minutes of continuous aerobic without signs/symptoms of physical distress   °  ° Intensity  ° THRR 40-80% of Max Heartrate 61-122   ° Ratings of Perceived Exertion 11-13   ° Perceived Dyspnea 0-4   °  ° Progression  ° Progression  Continue to progress workloads to maintain intensity without signs/symptoms of physical distress.   °  ° Resistance Training  ° Training Prescription Yes   ° Weight red bands   ° Reps 10-15   ° °  °  ° °  ° ° °Discharge Exercise Prescription (Final Exercise Prescription Changes): ° Exercise Prescription Changes - 07/29/21 1544   ° °  ° Response to Exercise  ° Blood Pressure (Admit) 130/82   ° Blood Pressure (Exercise) 144/80   ° Blood Pressure (Exit) 118/60   ° Heart Rate (Admit) 86 bpm   ° Heart Rate (Exercise) 92 bpm   ° Heart Rate (Exit) 76 bpm   ° Oxygen Saturation (Admit) 94 %   ° Oxygen Saturation (Exercise) 94 %   ° Oxygen Saturation (Exit) 95 %   ° Rating of Perceived Exertion (Exercise) 9   ° Perceived Dyspnea (Exercise) 1   ° Duration Progress to 30 minutes of  aerobic without signs/symptoms of physical distress   ° Intensity --   40-80%   HRR     Resistance Training   Training Prescription Yes    Weight red bands    Reps 10-15    Time 10 Minutes      Oxygen   Oxygen Continuous    Liters 1      NuStep   Level 1    SPM 60    Minutes 30    METs 1.7      Oxygen   Maintain Oxygen Saturation 88% or higher             Functional Capacity:  6 Minute Walk     Row Name 07/23/21 1451         6 Minute Walk   Phase Initial     Distance 850 feet     Walk Time 6 minutes     # of Rest Breaks 1  4:00-4:47     MPH 1.61     METS 2.02     RPE 12     Perceived Dyspnea  1     VO2 Peak 7.06     Symptoms No     Resting HR 69 bpm     Resting BP 158/70     Resting Oxygen Saturation  97 %     Exercise Oxygen Saturation  during 6 min walk 87 %     Max Ex. HR 111 bpm     Max Ex. BP 170/88     2 Minute Post BP 150/80       Interval HR   1 Minute HR 88     2 Minute HR 100     3 Minute HR 108     4 Minute HR 111     5 Minute HR 99     6 Minute HR 107     2 Minute Post HR 90     Interval Heart Rate? Yes       Interval Oxygen   Interval Oxygen? Yes     Baseline Oxygen  Saturation % 97 %     1 Minute Oxygen Saturation % 94 %     1 Minute Liters of Oxygen 1 L     2 Minute Oxygen Saturation % 90 %     2 Minute Liters of Oxygen 1 L     3 Minute Oxygen Saturation % 89 %  3:50: 87%     3 Minute Liters of Oxygen 1 L  Increased to 2L     4 Minute Oxygen Saturation % 88 %     4 Minute Liters of Oxygen 2 L     5 Minute Oxygen Saturation % 95 %     5 Minute Liters of Oxygen 2 L     6 Minute Oxygen Saturation % 94 %     6 Minute Liters of Oxygen 2 L     2 Minute Post Oxygen Saturation % 97 %     2 Minute Post Liters of Oxygen 2 L              Psychological, QOL, Others - Outcomes: PHQ 2/9: Depression screen PHQ 2/9 07/23/2021  Decreased Interest 1  Down, Depressed, Hopeless 1  PHQ - 2 Score 2  Altered sleeping 1  Tired, decreased energy 1  Change in appetite 0  Feeling bad or failure about yourself  0  Trouble concentrating 0  Moving slowly or fidgety/restless 0  Suicidal thoughts 0  PHQ-9 Score 4  Difficult doing work/chores Somewhat difficult  Quality of Life:   Personal Goals: Goals established at orientation with interventions provided to work toward goal.  Personal Goals and Risk Factors at Admission - 07/23/21 1340       Core Components/Risk Factors/Patient Goals on Admission    Weight Management Weight Loss;Yes    Intervention Weight Management: Provide education and appropriate resources to help participant work on and attain dietary goals.;Weight Management/Obesity: Establish reasonable short term and long term weight goals.;Weight Management: Develop a combined nutrition and exercise program designed to reach desired caloric intake, while maintaining appropriate intake of nutrient and fiber, sodium and fats, and appropriate energy expenditure required for the weight goal.;Obesity: Provide education and appropriate resources to help participant work on and attain dietary goals.    Expected Outcomes Weight Loss: Understanding of  general recommendations for a balanced deficit meal plan, which promotes 1-2 lb weight loss per week and includes a negative energy balance of [phone] kcal/d;Short Term: Continue to assess and modify interventions until short term weight is achieved;Long Term: Adherence to nutrition and physical activity/exercise program aimed toward attainment of established weight goal;Weight Maintenance: Understanding of the daily nutrition guidelines, which includes 25-35% calories from fat, 7% or less cal from saturated fats, less than 242m cholesterol, less than 1.5gm of sodium, & 5 or more servings of fruits and vegetables daily;Understanding recommendations for meals to include 15-35% energy as protein, 25-35% energy from fat, 35-60% energy from carbohydrates, less than 206mof dietary cholesterol, 20-35 gm of total fiber daily;Understanding of distribution of calorie intake throughout the day with the consumption of 4-5 meals/snacks;Weight Gain: Understanding of general recommendations for a high calorie, high protein meal plan that promotes weight gain by distributing calorie intake throughout the day with the consumption for 4-5 meals, snacks, and/or supplements    Improve shortness of breath with ADL's Yes    Intervention Provide education, individualized exercise plan and daily activity instruction to help decrease symptoms of SOB with activities of daily living.    Expected Outcomes Short Term: Improve cardiorespiratory fitness to achieve a reduction of symptoms when performing ADLs;Long Term: Be able to perform more ADLs without symptoms or delay the onset of symptoms    Increase knowledge of respiratory medications and ability to use respiratory devices properly  Yes    Intervention Provide education and demonstration as needed of appropriate use of medications, inhalers, and oxygen therapy.    Expected Outcomes Short Term: Achieves understanding of medications use. Understands that oxygen is a medication  prescribed by physician. Demonstrates appropriate use of inhaler and oxygen therapy.;Long Term: Maintain appropriate use of medications, inhalers, and oxygen therapy.              Personal Goals Discharge:  Goals and Risk Factor Review     Row Name 08/11/21 1156             Core Components/Risk Factors/Patient Goals Review   Personal Goals Review Weight Management/Obesity;Improve shortness of breath with ADL's;Develop more efficient breathing techniques such as purse lipped breathing and diaphragmatic breathing and practicing self-pacing with activity.;Increase knowledge of respiratory medications and ability to use respiratory devices properly.       Review I am unable to reevaluate her core components due to her only attendig 2 sessions thus far. She has been hospitalized with COPD exacerbation and has not returned yet.       Expected Outcomes Weight loss, improved SOB with ADL's and more efficient breathing techniques with her return and progress with exercise.  Exercise Goals and Review:  Exercise Goals     Row Name 07/23/21 1455 08/02/21 1144           Exercise Goals   Increase Physical Activity Yes Yes      Intervention Provide advice, education, support and counseling about physical activity/exercise needs.;Develop an individualized exercise prescription for aerobic and resistive training based on initial evaluation findings, risk stratification, comorbidities and participant's personal goals. Provide advice, education, support and counseling about physical activity/exercise needs.;Develop an individualized exercise prescription for aerobic and resistive training based on initial evaluation findings, risk stratification, comorbidities and participant's personal goals.      Expected Outcomes Short Term: Attend rehab on a regular basis to increase amount of physical activity.;Long Term: Add in home exercise to make exercise part of routine and to increase  amount of physical activity.;Long Term: Exercising regularly at least 3-5 days a week. Short Term: Attend rehab on a regular basis to increase amount of physical activity.;Long Term: Add in home exercise to make exercise part of routine and to increase amount of physical activity.;Long Term: Exercising regularly at least 3-5 days a week.      Increase Strength and Stamina Yes Yes      Intervention Provide advice, education, support and counseling about physical activity/exercise needs.;Develop an individualized exercise prescription for aerobic and resistive training based on initial evaluation findings, risk stratification, comorbidities and participant's personal goals. Provide advice, education, support and counseling about physical activity/exercise needs.;Develop an individualized exercise prescription for aerobic and resistive training based on initial evaluation findings, risk stratification, comorbidities and participant's personal goals.      Expected Outcomes Short Term: Perform resistance training exercises routinely during rehab and add in resistance training at home;Short Term: Increase workloads from initial exercise prescription for resistance, speed, and METs.;Long Term: Improve cardiorespiratory fitness, muscular endurance and strength as measured by increased METs and functional capacity (6MWT) Short Term: Perform resistance training exercises routinely during rehab and add in resistance training at home;Short Term: Increase workloads from initial exercise prescription for resistance, speed, and METs.;Long Term: Improve cardiorespiratory fitness, muscular endurance and strength as measured by increased METs and functional capacity (6MWT)      Able to understand and use rate of perceived exertion (RPE) scale Yes Yes      Intervention Provide education and explanation on how to use RPE scale Provide education and explanation on how to use RPE scale      Expected Outcomes Short Term: Able to use  RPE daily in rehab to express subjective intensity level;Long Term:  Able to use RPE to guide intensity level when exercising independently Short Term: Able to use RPE daily in rehab to express subjective intensity level;Long Term:  Able to use RPE to guide intensity level when exercising independently      Able to understand and use Dyspnea scale Yes Yes      Intervention Provide education and explanation on how to use Dyspnea scale Provide education and explanation on how to use Dyspnea scale      Expected Outcomes Short Term: Able to use Dyspnea scale daily in rehab to express subjective sense of shortness of breath during exertion;Long Term: Able to use Dyspnea scale to guide intensity level when exercising independently Short Term: Able to use Dyspnea scale daily in rehab to express subjective sense of shortness of breath during exertion;Long Term: Able to use Dyspnea scale to guide intensity level when exercising independently      Knowledge and understanding of Target Heart  Rate Range (THRR) Yes Yes      Intervention Provide education and explanation of THRR including how the numbers were predicted and where they are located for reference Provide education and explanation of THRR including how the numbers were predicted and where they are located for reference      Expected Outcomes Short Term: Able to state/look up THRR;Long Term: Able to use THRR to govern intensity when exercising independently;Short Term: Able to use daily as guideline for intensity in rehab Short Term: Able to state/look up THRR;Long Term: Able to use THRR to govern intensity when exercising independently;Short Term: Able to use daily as guideline for intensity in rehab      Understanding of Exercise Prescription Yes Yes      Intervention Provide education, explanation, and written materials on patient's individual exercise prescription Provide education, explanation, and written materials on patient's individual exercise  prescription      Expected Outcomes Short Term: Able to explain program exercise prescription;Long Term: Able to explain home exercise prescription to exercise independently Short Term: Able to explain program exercise prescription;Long Term: Able to explain home exercise prescription to exercise independently               Exercise Goals Re-Evaluation:  Exercise Goals Re-Evaluation     Row Name 08/02/21 1144             Exercise Goal Re-Evaluation   Exercise Goals Review Increase Physical Activity;Increase Strength and Stamina;Able to understand and use rate of perceived exertion (RPE) scale;Able to understand and use Dyspnea scale;Knowledge and understanding of Target Heart Rate Range (THRR);Understanding of Exercise Prescription       Comments Amanda Garrett has completed 2 exercise sessions Amanda Garrett exercises for 30 min on the Nustep at level 1. She averages 1.7 METs. Amanda Garrett performed the warmup and cooldown standing without limitations. She is very deconditioned and she knows this. Her hope is to improve her functional capacity. She is motivated to exercise. I discussed with her possibly moving to the track for 10 min in the future. She agreed with my recommendations. It is too soon to note and discernable progressions. Will continue to monitor and progress as able.       Expected Outcomes Through exercise at rehab and home, the patient will decrease shortness of breath with daily activities and feel confident in carrying out an exercise regimen at home.                Nutrition & Weight - Outcomes:  Pre Biometrics - 07/23/21 1313       Pre Biometrics   Grip Strength 17 kg              Nutrition:   Nutrition Discharge:   Education Questionnaire Score:  Knowledge Questionnaire Score - 07/23/21 1357       Knowledge Questionnaire Score   Pre Score 15/18             Goals reviewed with patient; copy given to patient.

## 2021-08-23 ENCOUNTER — Ambulatory Visit (INDEPENDENT_AMBULATORY_CARE_PROVIDER_SITE_OTHER): Payer: Medicare Other | Admitting: Nurse Practitioner

## 2021-08-23 ENCOUNTER — Ambulatory Visit (HOSPITAL_COMMUNITY)
Admission: RE | Admit: 2021-08-23 | Discharge: 2021-08-23 | Disposition: A | Discharge status: Home / Self Care | Payer: Medicare Other | Source: Ambulatory Visit | Attending: Nurse Practitioner | Admitting: Nurse Practitioner

## 2021-08-23 ENCOUNTER — Other Ambulatory Visit: Payer: Self-pay

## 2021-08-23 ENCOUNTER — Encounter: Payer: Self-pay | Admitting: Nurse Practitioner

## 2021-08-23 VITALS — BP 128/70 | HR 85 | Temp 98.1°F | Ht 60.0 in | Wt 198.6 lb

## 2021-08-23 DIAGNOSIS — R109 Unspecified abdominal pain: Secondary | ICD-10-CM | POA: Insufficient documentation

## 2021-08-23 DIAGNOSIS — J441 Chronic obstructive pulmonary disease with (acute) exacerbation: Secondary | ICD-10-CM

## 2021-08-23 DIAGNOSIS — S301XXA Contusion of abdominal wall, initial encounter: Secondary | ICD-10-CM | POA: Diagnosis present

## 2021-08-23 DIAGNOSIS — D649 Anemia, unspecified: Secondary | ICD-10-CM

## 2021-08-23 DIAGNOSIS — J9611 Chronic respiratory failure with hypoxia: Secondary | ICD-10-CM

## 2021-08-23 DIAGNOSIS — R911 Solitary pulmonary nodule: Secondary | ICD-10-CM

## 2021-08-23 DIAGNOSIS — R1012 Left upper quadrant pain: Secondary | ICD-10-CM

## 2021-08-23 LAB — CBC WITH DIFFERENTIAL/PLATELET
Basophils Absolute: 0 10*3/uL (ref 0.0–0.1)
Basophils Relative: 0.4 % (ref 0.0–3.0)
Eosinophils Absolute: 0.1 10*3/uL (ref 0.0–0.7)
Eosinophils Relative: 1.5 % (ref 0.0–5.0)
HCT: 31.6 % — ABNORMAL LOW (ref 36.0–46.0)
Hemoglobin: 10.5 g/dL — ABNORMAL LOW (ref 12.0–15.0)
Lymphocytes Relative: 18.8 % (ref 12.0–46.0)
Lymphs Abs: 1.7 10*3/uL (ref 0.7–4.0)
MCHC: 33.4 g/dL (ref 30.0–36.0)
MCV: 90.9 fl (ref 78.0–100.0)
Monocytes Absolute: 0.5 10*3/uL (ref 0.1–1.0)
Monocytes Relative: 5.6 % (ref 3.0–12.0)
Neutro Abs: 6.8 10*3/uL (ref 1.4–7.7)
Neutrophils Relative %: 73.7 % (ref 43.0–77.0)
Platelets: 314 10*3/uL (ref 150.0–400.0)
RBC: 3.47 Mil/uL — ABNORMAL LOW (ref 3.87–5.11)
RDW: 15.2 % (ref 11.5–15.5)
WBC: 9.3 10*3/uL (ref 4.0–10.5)

## 2021-08-23 MED ORDER — IOHEXOL 300 MG/ML  SOLN
100.0000 mL | Freq: Once | INTRAMUSCULAR | Status: AC | PRN
  Administered 2021-08-23: 100 mL via INTRAVENOUS

## 2021-08-23 NOTE — Patient Instructions (Signed)
-  Continue Albuterol inhaler 2 puffs or 3 mL neb every 6 hours as needed for shortness of breath or wheezing. Notify if symptoms persist despite rescue inhaler/neb use. ?-Continue Trelegy 1 puff daily. Brush tongue and rinse mouth well afterwards.  ?-Continue claritin 10 mg daily for allergies ?-Continue supplemental oxygen 2 lpm continuously for goal oxygen level >88-90% ? ?Abdominal CT scan with contrast. Hold your metformin for 48 hours after the scan.  ?Labs today - CBC with diff ? ?Follow up in two weeks with Amanda Garrett or Amanda Jersey Heberto Sturdevant,NP. If symptoms do not improve or worsen, please contact office for sooner follow up or seek emergency care. ?

## 2021-08-23 NOTE — Progress Notes (Signed)
$'@Patient'l$  ID: Amanda Garrett, female    DOB: Jul 28, 1955, 66 y.o.   MRN: [id number]  Chief Complaint  Patient presents with   Hospitalization Follow-up    Pt states since she has been out of the hospital, she has been fatigued, weak, and also has had had some pain in her abdomen.    Referring provider: Leonard Downing, *  HPI: 66 year old female, former smoker (11 pack years) followed for COPD and chronic respiratory failure on supplemental O2.  She is a patient of Dr. Mauricio Po and last seen 04/06/2021.  Past medical history significant for GERD, history of asthma, DM 2, hypertension, HLD.  She was recently admitted for AECOPD and discharged on 08/08/2021.  TEST/EVENTS:  12/02/2020 PFTs: FVC 1.29 (47), FEV1 0.75 (35), ratio 58, TLC 90%, DLCOcor 76% 08/05/2021 CXR 2 view: Mild cardiomegaly which is unchanged.  She has stable mediastinal contours.  Chronic hyperinflation and central bronchial thickening.  No acute process identified.  Previous CT scan was reviewed for comparison and demonstrated a 7 to 8 mm nodule in the right middle lobe that was not mentioned in the initial report.  08/05/2021-08/08/2021: Hospitalization for AECOPD and acute on chronic respiratory failure.  Treated with steroids, antibiotics and inhalers.  Increasing oxygen requirements from home from 1 L/min to 4 L/min. Ended up being discharged on 2 L/min.  CXR did not show any evidence of superimposed infection.  There was some chronic hyperinflation and central bronchial wall thickening.  The radiologist had reviewed the previous CT scan from May 2022 and noted a 7 to 8 mm nodule in the right middle lobe that was never mentioned in the initial report; advised outpatient follow-up.  Was on Lovenox during her stay.  She was discharged home with plans to follow-up with her PCP and pulmonary outpatient.  08/23/2021: Today-Hospital follow-up Patient presents today with daughter for hospital follow-up.  Her breathing has been  relatively stable since discharged and they feel like it has improved.  She does still have some shortness of breath with exertion and they feel like it is not completely back to her baseline.  Her main concern today was worsening fatigue and feeling like she cannot complete simple task without getting tired.  She also is concerned about the bruising on her abdomen and the abdominal tenderness that she has.  She had contacted the office approximately a week ago and reported that she had developed extensive bruising on her abdomen and was having some abdominal tenderness.  She was noted to be on Lovenox during her hospital stay and she was advised that it was likely residual from this.  This initially developed on 3/3 and while it has slowly improved, she continues to have left upper quadrant pain and bruising to her lower abdomen and pubic area.  Her PCP did check her hemoglobin a week ago and noted that it had dropped from 12.3 during her hospital stay to 10.2.  She denies any hematochezia, nausea or vomiting.  She is eating and drinking well.  She continues on Trelegy inhaler.  Has not been having to use her albuterol frequently.  She continues on 2 L/min nasal cannula without any increasing requirements.  Allergies  Allergen Reactions   Spiriva Respimat [Tiotropium Bromide Monohydrate] Anaphylaxis   Latex Rash and Other (See Comments)    NO powdered gloves!!   Nickel Rash    Immunization History  Administered Date(s) Administered   PFIZER(Purple Top)SARS-COV-2 Vaccination 08/15/2019, 09/11/2019, 03/02/2020    Past Medical History:  Diagnosis Date   Asthma    COPD (chronic obstructive pulmonary disease) ([place])    Diabetes mellitus without complication (HCC)    Emphysema of lung (HCC)    GERD (gastroesophageal reflux disease)    Hypercholesteremia 2011   Hypertension    Lichen sclerosus et atrophicus    vulva   Oxygen deficiency    Palpitation    on Inderal    Tobacco History: Social  History   Tobacco Use  Smoking Status Former   Packs/day: 2.00   Years: 34.00   Pack years: 68.00   Types: Cigarettes   Start date: 1965   Quit date: 06/1996   Years since quitting: 25.2  Smokeless Tobacco Never   Counseling given: Not Answered   Outpatient Medications Prior to Visit  Medication Sig Dispense Refill   albuterol (PROVENTIL) (2.5 MG/3ML) 0.083% nebulizer solution Take 2.5 mg by nebulization every 6 (six) hours as needed for wheezing or shortness of breath.     atorvastatin (LIPITOR) 10 MG tablet Take 10 mg by mouth daily.     BAYER LOW DOSE 81 MG EC tablet Take 81 mg by mouth daily. Swallow whole.     Calcium-Magnesium-Zinc (CAL-MAG-ZINC PO) Take 1 tablet by mouth daily.     Cholecalciferol (VITAMIN D-3) 25 MCG (1000 UT) CAPS Take 1,000 Units by mouth daily.     clobetasol ointment (TEMOVATE) 7.79 % Apply 1 application topically daily as needed (in the perineal area).     Cyanocobalamin (B-12) 2500 MCG TABS Take 2,500 mcg by mouth daily.     famotidine (PEPCID) 20 MG tablet Take 1 tablet (20 mg total) by mouth 2 (two) times daily as needed for heartburn or indigestion. (Patient taking differently: Take 20 mg by mouth at bedtime.) 90 tablet 3   Fluticasone-Umeclidin-Vilant (TRELEGY ELLIPTA) 200-62.5-25 MCG/ACT AEPB INHALE 1 PUFF ONCE DAILY (Patient taking differently: Inhale 1 puff into the lungs daily.) 60 each 2   guaiFENesin (MUCINEX) 600 MG 12 hr tablet Take 600 mg by mouth 2 (two) times daily as needed for cough.     loratadine (CLARITIN) 10 MG tablet Take 10 mg by mouth in the morning.     metFORMIN (GLUCOPHAGE) 850 MG tablet Take 850 mg by mouth 2 (two) times daily with a meal.     Misc Natural Products (SAMBUCUS ELDERBERRY IMMUNE) SYRP Take 5 mLs by mouth daily.     montelukast (SINGULAIR) 10 MG tablet Take 10 mg by mouth at bedtime.     Multiple Vitamins-Minerals (AIRBORNE PO) Take 1 tablet by mouth daily.     Multiple Vitamins-Minerals (CENTRUM SILVER ULTRA  WOMENS) TABS Take 1 tablet by mouth daily.     omeprazole (PRILOSEC) 40 MG capsule Take 1 capsule by mouth once daily (Patient taking differently: Take 40 mg by mouth every evening.) 30 capsule 0   OXYGEN Inhale 1 L into the lungs continuous.     Potassium 99 MG TABS Take 99 mg by mouth daily.     predniSONE (DELTASONE) 10 MG tablet Takes 6 tablets for 1 days, then 5 tablets for 1 days, then 4 tablets for 1 days, then 3 tablets for 1 days, then 2 tabs for 1 days, then 1 tab for 1 days, and then stop. 21 tablet 0   propranolol (INDERAL) 10 MG tablet Take 10 mg by mouth 2 (two) times daily.     Sodium Chloride-Sodium Bicarb (AYR SALINE NASAL RINSE NA) Place 1 spray into the nose daily as needed (sinus rinse).  triamcinolone (KENALOG) 0.025 % cream Apply 1 application topically 2 (two) times daily as needed (rash).     VENTOLIN HFA 108 (90 Base) MCG/ACT inhaler Inhale 2 puffs into the lungs every 6 (six) hours as needed for wheezing or shortness of breath.     vitamin C (ASCORBIC ACID) 500 MG tablet Take 500 mg by mouth daily.     melatonin 5 MG TABS Take 2.5 mg by mouth at bedtime as needed (sleep). (Patient not taking: Reported on 08/23/2021)     No facility-administered medications prior to visit.     Review of Systems:   Constitutional: No weight loss or gain, night sweats, fevers, chills. +fatigue HEENT: No headaches, difficulty swallowing, tooth/dental problems, or sore throat. No sneezing, itching, ear ache, nasal congestion, or post nasal drip CV:  No chest pain, orthopnea, PND, swelling in lower extremities, anasarca, dizziness, palpitations, syncope Resp: +shortness of breath with exertion (slowly improving; better than when she was discharged); rare wheeze; minimal occasional dry cough (significantly improved). No excess mucus or change in color of mucus. No productive or non-productive. No hemoptysis. No chest wall deformity GI:  +abdominal tenderness. No heartburn, indigestion,  nausea, vomiting, diarrhea, change in bowel habits, loss of appetite, bloody stools.  GU: No dysuria, change in color of urine, urgency or frequency.  No flank pain, no hematuria  Skin: +abdominal bruising. No rash, lesions, ulcerations MSK:  No joint pain or swelling.  No decreased range of motion.  No back pain. Neuro: No dizziness or lightheadedness.  Psych: No depression or anxiety. Mood stable.     Physical Exam:  BP 128/70 (BP Location: Left Wrist, Patient Position: Sitting, Cuff Size: Normal)    Pulse 85    Temp 98.1 F (36.7 C) (Oral)    Ht 5' (1.524 m)    Wt 198 lb 9.6 oz (90.1 kg)    SpO2 96% Comment: 1.5L   BMI 38.79 kg/m   GEN: Pleasant, interactive, chronically-ill appearing; obese; in no acute distress. HEENT:  Normocephalic and atraumatic. PERRLA. Sclera white. Nasal turbinates pink, moist and patent bilaterally. No rhinorrhea present. Oropharynx pink and moist, without exudate or edema. No lesions, ulcerations, or postnasal drip.  NECK:  Supple w/ fair ROM. No JVD present. Normal carotid impulses w/o bruits. Thyroid symmetrical with no goiter or nodules palpated. No lymphadenopathy.   CV: RRR, no m/r/g, no peripheral edema. Pulses intact, +2 bilaterally. No cyanosis, pallor or clubbing. PULMONARY:  Unlabored, regular breathing. Diminished bases b/l; otherwise clear A&P w/o wheezes/rales/rhonchi. No accessory muscle use. No dullness to percussion. GI: BS present and normoactive. Soft to palpation. Tenderness upon palpation LUQ. No CVA tenderness. MSK: No erythema, warmth or tenderness. Cap refil <2 sec all extrem. No deformities or joint swelling noted.  Neuro: A/Ox3. No focal deficits noted.   Skin: Warm, no lesions or rashes. Bruising to lower abdomen and pubic region (when compared to initial picture, bruising is improving) Psych: Normal affect and behavior. Judgement and thought content appropriate.     Lab Results:  CBC    Component Value Date/Time   WBC 9.3  08/23/2021 1640   RBC 3.47 (L) 08/23/2021 1640   HGB 10.5 (L) 08/23/2021 1640   HCT 31.6 (L) 08/23/2021 1640   PLT 314.0 08/23/2021 1640   MCV 90.9 08/23/2021 1640   MCH 29.6 08/08/2021 0619   MCHC 33.4 08/23/2021 1640   RDW 15.2 08/23/2021 1640   LYMPHSABS 1.7 08/23/2021 1640   MONOABS 0.5 08/23/2021 1640   EOSABS 0.1  08/23/2021 1640   BASOSABS 0.0 08/23/2021 1640    BMET    Component Value Date/Time   NA 137 08/08/2021 0619   K 3.9 08/08/2021 0619   CL 99 08/08/2021 0619   CO2 32 08/08/2021 0619   GLUCOSE 99 08/08/2021 0619   BUN 24 (H) 08/08/2021 0619   CREATININE 0.86 08/08/2021 0619   CALCIUM 8.4 (L) 08/08/2021 0619   GFRNONAA >60 08/08/2021 0619   GFRAA  05/24/2010 1203    >60        The eGFR has been calculated using the MDRD equation. This calculation has not been validated in all clinical situations. eGFR's persistently <60 mL/min signify possible Chronic Kidney Disease.    BNP    Component Value Date/Time   BNP 96.9 08/05/2021 1818     Imaging:  DG Chest 2 View  Result Date: 08/05/2021 CLINICAL DATA:  Shortness of breath. EXAM: CHEST - 2 VIEW COMPARISON:  Radiograph and CT 11/09/2020 FINDINGS: Mild cardiomegaly unchanged. Lower mediastinum accentuated by epicardial fat pads. Stable mediastinal contours. Chronic hyperinflation and central bronchial thickening. No acute airspace disease, pleural effusion, or pneumothorax no acute osseous abnormalities are seen. IMPRESSION: 1. No acute findings. 2. Stable mild cardiomegaly and hyperinflation. 3. Review of prior CT demonstrates a 7-8 mm nodule in the right middle lobe that was not mentioned in the initial report. Recommend elective chest CT follow-up on an outpatient basis after resolution of acute event. Electronically Signed   By: Keith Rake M.D.   On: 08/05/2021 20:03      PFT Results Latest Ref Rng & Units 12/02/2020  FVC-Pre L 1.29  FVC-Predicted Pre % 47  FVC-Post L 1.28  FVC-Predicted Post %  46  Pre FEV1/FVC % % 55  Post FEV1/FCV % % 58  FEV1-Pre L 0.70  FEV1-Predicted Pre % 33  FEV1-Post L 0.75  DLCO uncorrected ml/min/mmHg 13.56  DLCO UNC% % 76  DLCO corrected ml/min/mmHg 13.65  DLCO COR %Predicted % 76  DLVA Predicted % 113  TLC L 4.19  TLC % Predicted % 90  RV % Predicted % 139    No results found for: NITRICOXIDE      Assessment & Plan:   COPD with acute exacerbation (HCC) Slowly resolving AECOPD. Feels as though breathing has significantly improved but not completely at baseline. Continue triple therapy regimen. CBC today to evaluate for worsening anemia and ensure this is not contributing to SOB.   Patient Instructions  -Continue Albuterol inhaler 2 puffs or 3 mL neb every 6 hours as needed for shortness of breath or wheezing. Notify if symptoms persist despite rescue inhaler/neb use. -Continue Trelegy 1 puff daily. Brush tongue and rinse mouth well afterwards.  -Continue claritin 10 mg daily for allergies -Continue supplemental oxygen 2 lpm continuously for goal oxygen level >88-90%  Abdominal CT scan with contrast. Hold your metformin for 48 hours after the scan.  Labs today - CBC with diff  Follow up in two weeks with Dr. [name] or Katie Davontay [place],NP. If symptoms do not improve or worsen, please contact office for sooner follow up or seek emergency care.    Chronic respiratory failure with hypoxia (HCC) Oxygen stable on 2 lpm. Continue for goal SpO2 >88-90%  Abdominal pain Significant bruising and LUQ tenderness. Could be sequale from lovenox inj and possible hematoma that is slowly resolving. However, given timeframe and significant tenderness upon palpation as well as decrease in hgb, will repeat CBC today and obtain CT abdomen w and wo contrast  for further evaluation of underlying etiology.  Lung nodule Discovered on previous CT scan from 10/2020 by radiologist during most recent hospitalization. Advised outpatient follow up. Given smoking history,  will repeat CT chest wo contrast for further evaluation of lung nodule.    I spent 35 minutes of dedicated to the care of this patient on the date of this encounter to include pre-visit review of records, face-to-face time with the patient discussing conditions above, post visit ordering of testing, clinical documentation with the electronic health record, making appropriate referrals as documented, and communicating necessary findings to members of the patients care team.  Clayton Bibles, NP 08/23/2021  Pt aware and understands NP's role.

## 2021-08-23 NOTE — Assessment & Plan Note (Signed)
Oxygen stable on 2 lpm. Continue for goal SpO2 >88-90% ?

## 2021-08-23 NOTE — Assessment & Plan Note (Signed)
Significant bruising and LUQ tenderness. Could be sequale from lovenox inj and possible hematoma that is slowly resolving. However, given timeframe and significant tenderness upon palpation as well as decrease in hgb, will repeat CBC today and obtain CT abdomen w and wo contrast for further evaluation of underlying etiology. ?

## 2021-08-23 NOTE — Assessment & Plan Note (Signed)
Discovered on previous CT scan from 10/2020 by radiologist during most recent hospitalization. Advised outpatient follow up. Given smoking history, will repeat CT chest wo contrast for further evaluation of lung nodule.  ?

## 2021-08-23 NOTE — Assessment & Plan Note (Signed)
Slowly resolving AECOPD. Feels as though breathing has significantly improved but not completely at baseline. Continue triple therapy regimen. CBC today to evaluate for worsening anemia and ensure this is not contributing to SOB.  ? ?Patient Instructions  ?-Continue Albuterol inhaler 2 puffs or 3 mL neb every 6 hours as needed for shortness of breath or wheezing. Notify if symptoms persist despite rescue inhaler/neb use. ?-Continue Trelegy 1 puff daily. Brush tongue and rinse mouth well afterwards.  ?-Continue claritin 10 mg daily for allergies ?-Continue supplemental oxygen 2 lpm continuously for goal oxygen level >88-90% ? ?Abdominal CT scan with contrast. Hold your metformin for 48 hours after the scan.  ?Labs today - CBC with diff ? ?Follow up in two weeks with Dr. [name] or Joellen Jersey Tationa Stech,NP. If symptoms do not improve or worsen, please contact office for sooner follow up or seek emergency care. ? ? ?

## 2021-08-24 ENCOUNTER — Encounter (HOSPITAL_COMMUNITY): Payer: Medicare Other

## 2021-08-24 ENCOUNTER — Telehealth: Payer: Self-pay | Admitting: Internal Medicine

## 2021-08-24 DIAGNOSIS — R911 Solitary pulmonary nodule: Secondary | ICD-10-CM

## 2021-08-24 NOTE — Telephone Encounter (Signed)
Patient's daughter, Edwena Felty) is aware of results and voiced her understanding.  ?CT ordered for 6-73mo ?Nothing further needed.  ? ?

## 2021-08-24 NOTE — Telephone Encounter (Signed)
Spoke to patient's daughter, Edwena Felty) ?She is requesting CT results from 08/23/2021. ? ?Katie, please advise. Thanks ?

## 2021-08-26 ENCOUNTER — Encounter (HOSPITAL_COMMUNITY): Admission: RE | Admit: 2021-08-26 | Payer: Medicare Other | Source: Ambulatory Visit

## 2021-08-31 ENCOUNTER — Encounter (HOSPITAL_COMMUNITY): Payer: Medicare Other

## 2021-09-02 ENCOUNTER — Encounter (HOSPITAL_COMMUNITY): Payer: Medicare Other

## 2021-09-06 ENCOUNTER — Other Ambulatory Visit: Payer: Self-pay

## 2021-09-06 ENCOUNTER — Encounter: Payer: Self-pay | Admitting: Nurse Practitioner

## 2021-09-06 ENCOUNTER — Ambulatory Visit (INDEPENDENT_AMBULATORY_CARE_PROVIDER_SITE_OTHER): Payer: Medicare Other | Admitting: Nurse Practitioner

## 2021-09-06 VITALS — BP 138/72 | HR 77 | Ht 60.5 in | Wt 199.8 lb

## 2021-09-06 DIAGNOSIS — D649 Anemia, unspecified: Secondary | ICD-10-CM

## 2021-09-06 DIAGNOSIS — G4700 Insomnia, unspecified: Secondary | ICD-10-CM | POA: Insufficient documentation

## 2021-09-06 DIAGNOSIS — R058 Other specified cough: Secondary | ICD-10-CM

## 2021-09-06 DIAGNOSIS — J449 Chronic obstructive pulmonary disease, unspecified: Secondary | ICD-10-CM

## 2021-09-06 DIAGNOSIS — J9611 Chronic respiratory failure with hypoxia: Secondary | ICD-10-CM

## 2021-09-06 DIAGNOSIS — R911 Solitary pulmonary nodule: Secondary | ICD-10-CM

## 2021-09-06 DIAGNOSIS — J302 Other seasonal allergic rhinitis: Secondary | ICD-10-CM | POA: Insufficient documentation

## 2021-09-06 DIAGNOSIS — J301 Allergic rhinitis due to pollen: Secondary | ICD-10-CM

## 2021-09-06 DIAGNOSIS — S301XXD Contusion of abdominal wall, subsequent encounter: Secondary | ICD-10-CM | POA: Insufficient documentation

## 2021-09-06 DIAGNOSIS — G4709 Other insomnia: Secondary | ICD-10-CM

## 2021-09-06 MED ORDER — LEVOCETIRIZINE DIHYDROCHLORIDE 5 MG PO TABS: 5.0000 mg | ORAL_TABLET | Freq: Every evening | ORAL | 5 refills | Status: DC

## 2021-09-06 MED ORDER — FLUTICASONE PROPIONATE 50 MCG/ACT NA SUSP: 2.0000 | Freq: Every day | NASAL | 2 refills | Status: AC

## 2021-09-06 MED ORDER — BENZONATATE 200 MG PO CAPS: 200.0000 mg | ORAL_CAPSULE | Freq: Three times a day (TID) | ORAL | 1 refills | Status: DC | PRN

## 2021-09-06 NOTE — Assessment & Plan Note (Addendum)
No increasing O2 demand. Walking oximetry with POC qualification today. 3 lpm POC required to maintain saturations in the 90's. Goal SpO2 >88-90% ?

## 2021-09-06 NOTE — Assessment & Plan Note (Signed)
Stable. CT chest for surveillance in 6-12 months. ?

## 2021-09-06 NOTE — Progress Notes (Signed)
? ?'@Patient'$  ID: Amanda Garrett, female    DOB: 30-Nov-1955, 66 y.o.   MRN: [id number] ? ?Chief Complaint  ?Patient presents with  ? Follow-up  ?  Bronchitis f/u  ? ? ?Referring provider: ?Amanda Garrett, * ? ?HPI: ?66 year old female, former smoker (50 pack years) followed for COPD and chronic respiratory failure on supplemental O2.  She is a patient of Amanda Garrett and last seen 08/23/2021 by Amanda Cruise NP.  She was recently admitted for AECOPD and discharged on 08/08/2021. Past medical history significant for GERD, history of asthma, DM 2, hypertension, HLD.   ? ?TEST/EVENTS:  ?12/02/2020 PFTs: FVC 1.29 (47), FEV1 0.75 (35), ratio 58, TLC 90%, DLCO corrected 76%.  Severe obstructive airway disease, moderate to severe restrictive airway disease with normal lung volumes and mild diffusion defect. ?08/23/2021 CT chest wo con: Atherosclerosis.  Prominent prevascular lymph node, unchanged compared to prior and likely reactive.  Central airways are patent.  Centrilobular emphysema present.  Linear opacities of the lower lungs, likely due to scarring or atelectasis.  Solid nodule of the right middle lobe measuring 6 mm, unchanged when compared to previous.  Recommended repeat follow-up CT in 6 to 12 months. ?08/23/2021 CT abdomen with contrast: 11.4 x 4.3 cm heterogeneous collection in left rectus abdominus, concerning for likely hematoma.  No evidence of extravasation. ? ?08/05/2021-08/08/2021: Hospitalization for AECOPD and acute on chronic respiratory failure.  Treated with steroids, antibiotics and inhalers.  Increasing oxygen requirements from home from 1 L/min to 4 L/min. Ended up being discharged on 2 L/min.  CXR did not show any evidence of superimposed infection.  There was some chronic hyperinflation and central bronchial wall thickening.  The radiologist had reviewed the previous CT scan from May 2022 and noted a 7 to 8 mm nodule in the right middle lobe that was never mentioned in the initial report; advised  outpatient follow-up.  Was on Lovenox during her stay.  She was discharged home with plans to follow-up with her PCP and pulmonary outpatient. ? ?08/23/2021: OV with Amanda Sinning NP for hospital follow-up.  Breathing slowly improving; felt like her shortness of breath was better but she is not quite yet back to her baseline.  Mostly concerned about extensive bruising on her abdomen after Lovenox injections.  She did have a drop in her hemoglobin to 10.2 from 12.3.  Also had some persistent fatigue symptoms.  Continued on Trelegy inhaler and 2 L per nasal cannula without any increasing oxygen requirements.  CBC was checked with stable hemoglobin at 10.5.  Stat CT abdomen with contrast with hematoma without active extravasation.  Close follow-up and monitoring of symptoms advised.  CT chest showed stable nodule. ? ?09/06/2021: Today -follow-up ?Patient presents today with daughter for follow-up after being evaluated for abdominal tenderness and bruising.  She was found to have a hematoma without extravasation and stable hemoglobin, which she was advised to monitor her symptoms closely.  Today, she reports that her bruising has mostly resolved.  She still has some mild fatigue symptoms and feels as though this is slowly improving.  She does have some issues sleeping at night because she was told not to take her melatonin anymore when she was in the hospital last time.  She is on noninvasive ventilation at night without any difficulties.  Just has trouble initiating sleep onset.  Her breathing is overall stable.  She has had an increase in allergy type symptoms over the past few days as she has been trying to sit outside  more.  Increased nasal congestion with postnasal drip.  Feels like the postnasal drip is making her cough.  Cough is nonproductive.  Denies shortness of breath, wheezing, orthopnea, PND or lower extremity swelling.  She continues on 2 L supplemental O2 with no increased O2 demand.  She continues on Trelegy  daily. ? ?Allergies  ?Allergen Reactions  ? Spiriva Respimat [Tiotropium Bromide Monohydrate] Anaphylaxis  ? Latex Rash and Other (See Comments)  ?  NO powdered gloves!!  ? Nickel Rash  ? ? ?Immunization History  ?Administered Date(s) Administered  ? PFIZER(Purple Top)SARS-COV-2 Vaccination 08/15/2019, 09/11/2019, 03/02/2020  ? ? ?Past Medical History:  ?Diagnosis Date  ? Asthma   ? COPD (chronic obstructive pulmonary disease) ([place])   ? Diabetes mellitus without complication ([place])   ? Emphysema of lung ([place])   ? GERD (gastroesophageal reflux disease)   ? Hypercholesteremia 2011  ? Hypertension   ? 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 Oxygen deficiency   ? Palpitation   ? on Inderal  ? ? ?Tobacco History: ?Social History  ? ?Tobacco Use  ?Smoking Status Former  ? Packs/day: 2.00  ? Years: 34.00  ? Pack years: 68.00  ? Types: Cigarettes  ? Start date: 56  ? Quit date: 06/1996  ? Years since quitting: 25.2  ?Smokeless Tobacco Never  ? ?Counseling given: Not Answered ? ? ?Outpatient Medications Prior to Visit  ?Medication Sig Dispense Refill  ? albuterol (PROVENTIL) (2.5 MG/3ML) 0.083% nebulizer solution Take 2.5 mg by nebulization every 6 (six) hours as needed for wheezing or shortness of breath.    ? atorvastatin (LIPITOR) 10 MG tablet Take 10 mg by mouth daily.    ? BAYER LOW DOSE 81 MG EC tablet Take 81 mg by mouth daily. Swallow whole.    ? Calcium-Magnesium-Zinc (CAL-MAG-ZINC Garrett) Take 1 tablet by mouth daily.    ? Cholecalciferol (VITAMIN D-3) 25 MCG (1000 UT) CAPS Take 1,000 Units by mouth daily.    ? clobetasol ointment (TEMOVATE) 6.57 % Apply 1 application topically daily as needed (in the perineal area).    ? Cyanocobalamin (B-12) 2500 MCG TABS Take 2,500 mcg by mouth daily.    ? famotidine (PEPCID) 20 MG tablet Take 1 tablet (20 mg total) by mouth 2 (two) times daily as needed for heartburn or indigestion. (Patient taking differently: Take 20 mg by mouth at bedtime.) 90 tablet 3  ?  Fluticasone-Umeclidin-Vilant (TRELEGY ELLIPTA) 200-62.5-25 MCG/ACT AEPB INHALE 1 PUFF ONCE DAILY (Patient taking differently: Inhale 1 puff into the lungs daily.) 60 each 2  ? guaiFENesin (MUCINEX) 600 MG 12 hr tablet Take 600 mg by mouth 2 (two) times daily as needed for cough.    ? melatonin 5 MG TABS Take 2.5 mg by mouth at bedtime as needed (sleep).    ? metFORMIN (GLUCOPHAGE) 850 MG tablet Take 850 mg by mouth 2 (two) times daily with a meal.    ? Misc Natural Products (SAMBUCUS ELDERBERRY IMMUNE) SYRP Take 5 mLs by mouth daily.    ? montelukast (SINGULAIR) 10 MG tablet Take 10 mg by mouth at bedtime.    ? Multiple Vitamins-Minerals (AIRBORNE Garrett) Take 1 tablet by mouth daily.    ? Multiple Vitamins-Minerals (CENTRUM SILVER ULTRA WOMENS) TABS Take 1 tablet by mouth daily.    ? omeprazole (PRILOSEC) 40 MG capsule Take 1 capsule by mouth once daily (Patient taking differently: Take 40 mg by mouth every evening.) 30 capsule 0  ? OXYGEN Inhale 1 L  into the lungs continuous.    ? Potassium 99 MG TABS Take 99 mg by mouth daily.    ? predniSONE (DELTASONE) 10 MG tablet Takes 6 tablets for 1 days, then 5 tablets for 1 days, then 4 tablets for 1 days, then 3 tablets for 1 days, then 2 tabs for 1 days, then 1 tab for 1 days, and then stop. 21 tablet 0  ? propranolol (INDERAL) 10 MG tablet Take 10 mg by mouth 2 (two) times daily.    ? Sodium Chloride-Sodium Bicarb (AYR SALINE NASAL RINSE NA) Place 1 spray into the nose daily as needed (sinus rinse).    ? triamcinolone (KENALOG) 0.025 % cream Apply 1 application topically 2 (two) times daily as needed (rash).    ? VENTOLIN HFA 108 (90 Base) MCG/ACT inhaler Inhale 2 puffs into the lungs every 6 (six) hours as needed for wheezing or shortness of breath.    ? vitamin C (ASCORBIC ACID) 500 MG tablet Take 500 mg by mouth daily.    ? loratadine (CLARITIN) 10 MG tablet Take 10 mg by mouth in the morning.    ? ?No facility-administered medications prior to visit.  ? ? ? ?Review of  Systems:  ? ?Constitutional: No weight loss or gain, night sweats, fevers, chills. +fatigue (slowly improving) ?HEENT: No headaches, difficulty swallowing, tooth/dental problems, or sore throat. +sneezing, itchy eyes, nasal congestio

## 2021-09-06 NOTE — Assessment & Plan Note (Addendum)
Recheck CBC in 2 weeks to ensure stability. Could be contributing to fatigue symptoms. Follow up with PCP. ?

## 2021-09-06 NOTE — Patient Instructions (Addendum)
-  Continue Albuterol inhaler 2 puffs or 3 mL neb every 6 hours as needed for shortness of breath or wheezing. Notify if symptoms persist despite rescue inhaler/neb use. ?-Continue Trelegy 1 puff daily. Brush tongue and rinse mouth well afterwards.  ?-Continue supplemental oxygen 3 lpm POC for goal oxygen level >88-90%. Continue non-invasive ventilation at night. ?-Continue Singulair 10 mg At bedtime  ? ?-Mucinex DM 600 mg Twice daily for cough/congestion ?-Flonase 2 sprays each nostril daily for nasal congestion/drainage ?-Stop Claritin. Start Xyzal 5 mg daily for allergies  ?-Tessalon perles (benzonatate) 1 capsule Three times a day as needed for cough ?  ?Follow up in 3 months with Amanda Garrett. If symptoms do not improve or worsen, please contact office for sooner follow up or seek emergency care. ?

## 2021-09-06 NOTE — Assessment & Plan Note (Signed)
Sleep onset. Previously had good response to melatonin. Advised her to restart At bedtime as needed. Continue Triology at night  ?

## 2021-09-06 NOTE — Assessment & Plan Note (Addendum)
Worsening of allergy type symptoms. Feels as though Claritin doesn't work as well. Switch to Xyzal and initiate postnasal drainage control with intranasal steroid. Suspect postnasal drainage contributing to cough; advised to monitor. Cough control therapies today. ?

## 2021-09-06 NOTE — Assessment & Plan Note (Signed)
CT abd without extrav and stable hgb. Advised to recheck hemoglobin in 2 weeks to ensure stability. Tenderness has significantly improved and bruising mostly resolved. Continue to monitor.  ?

## 2021-09-06 NOTE — Assessment & Plan Note (Addendum)
Resolved AECOPD. Breathing stable. No increasing O2 demand. Continue on triple therapy. Advised to closely monitor allergy symptoms and notify if productive cough or worsened breathing occurs. Compliant with Trilogy vent at night - continues to receive good benefit.  ? ?Patient Instructions  ?-Continue Albuterol inhaler 2 puffs or 3 mL neb every 6 hours as needed for shortness of breath or wheezing. Notify if symptoms persist despite rescue inhaler/neb use. ?-Continue Trelegy 1 puff daily. Brush tongue and rinse mouth well afterwards.  ?-Continue supplemental oxygen 2 lpm continuously for goal oxygen level >88-90%. Continue non-invasive ventilation at night. ?-Continue Singulair 10 mg At bedtime  ? ?-Mucinex DM 600 mg Twice daily for cough/congestion ?-Flonase 2 sprays each nostril daily for nasal congestion/drainage ?-Stop Claritin. Start Xyzal 5 mg daily for allergies  ?-Tessalon perles (benzonatate) 1 capsule Three times a day as needed for cough ?  ?Follow up in 3 months with Dr. [name]. If symptoms do not improve or worsen, please contact office for sooner follow up or seek emergency care. ? ? ?

## 2021-09-07 ENCOUNTER — Encounter (HOSPITAL_COMMUNITY): Payer: Medicare Other

## 2021-09-09 ENCOUNTER — Encounter (HOSPITAL_COMMUNITY): Payer: Medicare Other

## 2021-09-14 ENCOUNTER — Encounter (HOSPITAL_COMMUNITY): Payer: Medicare Other

## 2021-09-16 ENCOUNTER — Encounter (HOSPITAL_COMMUNITY): Payer: Medicare Other

## 2021-09-21 ENCOUNTER — Encounter (HOSPITAL_COMMUNITY): Payer: Medicare Other

## 2021-09-23 ENCOUNTER — Encounter (HOSPITAL_COMMUNITY): Payer: Medicare Other

## 2021-10-22 ENCOUNTER — Other Ambulatory Visit: Payer: Self-pay | Admitting: Obstetrics and Gynecology

## 2021-10-22 DIAGNOSIS — Z1231 Encounter for screening mammogram for malignant neoplasm of breast: Secondary | ICD-10-CM

## 2021-11-04 ENCOUNTER — Other Ambulatory Visit: Payer: Self-pay | Admitting: Internal Medicine

## 2021-11-05 ENCOUNTER — Ambulatory Visit
Admission: RE | Admit: 2021-11-05 | Discharge: 2021-11-05 | Disposition: A | Discharge status: Home / Self Care | Payer: Medicare Other | Source: Ambulatory Visit | Attending: Obstetrics and Gynecology | Admitting: Obstetrics and Gynecology

## 2021-11-05 DIAGNOSIS — Z1231 Encounter for screening mammogram for malignant neoplasm of breast: Secondary | ICD-10-CM

## 2021-12-13 ENCOUNTER — Ambulatory Visit: Payer: Medicare Other | Admitting: Internal Medicine

## 2021-12-18 ENCOUNTER — Other Ambulatory Visit: Payer: Self-pay | Admitting: Internal Medicine

## 2021-12-22 ENCOUNTER — Ambulatory Visit (INDEPENDENT_AMBULATORY_CARE_PROVIDER_SITE_OTHER): Payer: Medicare Other | Admitting: Internal Medicine

## 2021-12-22 ENCOUNTER — Encounter: Payer: Self-pay | Admitting: Internal Medicine

## 2021-12-22 VITALS — BP 138/84 | HR 74 | Ht 60.5 in | Wt 200.0 lb

## 2021-12-22 DIAGNOSIS — J9611 Chronic respiratory failure with hypoxia: Secondary | ICD-10-CM | POA: Diagnosis not present

## 2021-12-22 DIAGNOSIS — J449 Chronic obstructive pulmonary disease, unspecified: Secondary | ICD-10-CM

## 2021-12-22 DIAGNOSIS — J9612 Chronic respiratory failure with hypercapnia: Secondary | ICD-10-CM | POA: Diagnosis not present

## 2021-12-22 MED ORDER — TRELEGY ELLIPTA 200-62.5-25 MCG/ACT IN AEPB: 1.0000 | INHALATION_SPRAY | Freq: Every day | RESPIRATORY_TRACT | 0 refills | Status: DC

## 2021-12-22 NOTE — Addendum Note (Signed)
Addended by: Valerie Salts on: 12/22/2021 02:07 PM   Modules accepted: Orders

## 2021-12-22 NOTE — Progress Notes (Signed)
Amanda Garrett    [id number]    06/29/1955  Primary Care Physician:Elkins, Curt Jews, MD Date of Appointment: 12/22/2021 Established Patient Visit  Chief complaint:   Chief Complaint  Patient presents with   Follow-up    3 mo f/u for COPD. States she has been doing well since last visit. Stilll using 2L of O2.      HPI: Amanda Garrett is a 66 y.o. woman with COPD and chronic bronchitis and chronic respiratory failure on 2LNC with nocturnal bipap.  Interval Updates: Here for COPD follow up. She was hospitalized for COPD exacerbation in Feb 2023. Treated with steroids and abx. She also developed rectus sheath hematoma from lovenox injections.  Saw Katie in office  She has a bipap machine at home which she wears at night. She feels good effect with this and improved quality of sleep.   Seasonal allergic rhinitis improved with medications.   Taking trelegy once a day. Minimal albuterol use.   Here with her daughter.    Past Medical History:  Diagnosis Date   Asthma    COPD (chronic obstructive pulmonary disease) ([place])    Diabetes mellitus without complication ([facility])    Emphysema of lung ([place])    GERD (gastroesophageal reflux disease)    Hypercholesteremia 2011   Hypertension    Lichen sclerosus et atrophicus    vulva   Oxygen deficiency    Palpitation    on Inderal    Past Surgical History:  Procedure Laterality Date   BIOPSY  03/01/2021   Procedure: BIOPSY;  Surgeon: Yetta Flock, MD;  Location: [facility];  Service: Gastroenterology;;   [place]     COLONOSCOPY WITH PROPOFOL N/A 03/01/2021   Procedure: COLONOSCOPY WITH PROPOFOL;  Surgeon: Yetta Flock, MD;  Location: [facility];  Service: Gastroenterology;  Laterality: N/A;   DILATION AND CURETTAGE, DIAGNOSTIC / THERAPEUTIC  05/2010   hysteroscopy, polypectomy   ESOPHAGOGASTRODUODENOSCOPY (EGD) WITH PROPOFOL N/A 03/01/2021   Procedure:  ESOPHAGOGASTRODUODENOSCOPY (EGD) WITH PROPOFOL;  Surgeon: Yetta Flock, MD;  Location: [facility];  Service: Gastroenterology;  Laterality: N/A;   HEMOSTASIS CLIP PLACEMENT  03/01/2021   Procedure: HEMOSTASIS CLIP PLACEMENT;  Surgeon: Yetta Flock, MD;  Location: [facility];  Service: Gastroenterology;;   KNEE ARTHROSCOPY Right 2013   POLYPECTOMY  03/01/2021   Procedure: POLYPECTOMY;  Surgeon: Yetta Flock, MD;  Location: [facility];  Service: Gastroenterology;;   [place]   dr Pearline Cables    Family History  Problem Relation Age of Onset   Colon polyps Mother    Diabetes Mother    Diverticulitis Mother    Diabetes Father    Diabetes Maternal Grandfather    Hypertension Maternal Grandfather    Breast cancer Paternal Grandmother    Ulcerative colitis Daughter    COPD Neg Hx    Lung cancer Neg Hx    Colon cancer Neg Hx    Esophageal cancer Neg Hx    Pancreatic cancer Neg Hx    Stomach cancer Neg Hx    Rectal cancer Neg Hx     Social History   Occupational History   Not on file  Tobacco Use   Smoking status: Former    Packs/day: 2.00    Years: 34.00    Total pack years: 68.00    Types: Cigarettes    Start  date: 101    Quit date: 06/1996    Years since quitting: 25.5   Smokeless tobacco: Never  Vaping Use   Vaping Use: Never used  Substance and Sexual Activity   Alcohol use: Yes    Comment: rare   Drug use: Never   Sexual activity: Not on file     Physical Exam: Blood pressure 138/84, pulse 74, height 5' 0.5" (1.537 m), weight 200 lb (90.7 kg), SpO2 96 %.  Gen:     On oxygen, nad Lungs:   diminished, no wheezes or crackles CV:         RRR   Data Reviewed: Imaging: I have personally reviewed the CT Angio from May 2022 which shows   PFTs:     Latest Ref Rng & Units 12/02/2020    3:50 PM  PFT Results  FVC-Pre L 1.29   FVC-Predicted Pre % 47   FVC-Post L 1.28   FVC-Predicted Post % 46   Pre  FEV1/FVC % % 55   Post FEV1/FCV % % 58   FEV1-Pre L 0.70   FEV1-Predicted Pre % 33   FEV1-Post L 0.75   DLCO uncorrected ml/min/mmHg 13.56   DLCO UNC% % 76   DLCO corrected ml/min/mmHg 13.65   DLCO COR %Predicted % 76   DLVA Predicted % 113   TLC L 4.19   TLC % Predicted % 90   RV % Predicted % 139    I have personally reviewed the patient's PFTs and they show very severe airflow limitation.   Labs: ABG shows hypercapnia - PCO2 65 mm Hg  Immunization status: Immunization History  Administered Date(s) Administered   PFIZER(Purple Top)SARS-COV-2 Vaccination 08/15/2019, 09/11/2019, 03/02/2020    Assessment:  Very Severe COPD FEV1 33% of predicted Acute on chronic hypoxemic and hypercapnic respiratory failure Solitary pulmonary nodule   Plan/Recommendations:  Continue trelegy, prn albuterol Continue nocturnal NIV Continue home oxygen. Will re-refer to pulmonary rehab for gold stage 4 copd.  Repeat CT Chest in March 2024 for follow up.   Return to Care: Return in about 4 months (around 04/24/2022).   Lenice Llamas, MD Pulmonary and [place]

## 2021-12-22 NOTE — Patient Instructions (Signed)
Please schedule follow up scheduled with myself in 4 months.  If my schedule is not open yet, we will contact you with a reminder closer to that time. Please call [phone] if you haven't heard from [place] a month before.   Continue trelegy I repeated the referral for pulmonary rehab - they will call you to schedule.  Call me if issues or concerns about your breathing.

## 2021-12-30 ENCOUNTER — Other Ambulatory Visit: Payer: Self-pay | Admitting: Gastroenterology

## 2022-01-19 ENCOUNTER — Telehealth (HOSPITAL_COMMUNITY): Payer: Self-pay

## 2022-01-19 NOTE — Telephone Encounter (Signed)
Pt insurance is active and benefits verified through Medicare a/b Co-pay 0, DED $226/$226 met, out of pocket 0/0 met, co-insurance 20%. no pre-authorization required.  °  °2ndary insurance is active and benefits verified through [employer]. Co-pay 0, DED 0/0 met, out of pocket 0/0 met, co-insurance 0%. No pre-authorization required. °

## 2022-01-19 NOTE — Telephone Encounter (Signed)
Called patient to see if she was interested in participating in the Pulmonary Rehab Program. Patient stated yes. Patient will come in for orientation on 01/24/2022'@10'$ :30am and will attend the 1:15pm exercise class.   Tourist information centre manager.

## 2022-01-24 ENCOUNTER — Encounter (HOSPITAL_COMMUNITY): Payer: Self-pay

## 2022-01-24 ENCOUNTER — Encounter (HOSPITAL_COMMUNITY)
Admission: RE | Admit: 2022-01-24 | Discharge: 2022-01-24 | Disposition: A | Discharge status: Home / Self Care | Payer: Medicare Other | Source: Ambulatory Visit | Attending: Internal Medicine | Admitting: Internal Medicine

## 2022-01-24 VITALS — BP 130/72 | HR 85 | Ht 61.0 in | Wt 194.7 lb

## 2022-01-24 DIAGNOSIS — J449 Chronic obstructive pulmonary disease, unspecified: Secondary | ICD-10-CM | POA: Insufficient documentation

## 2022-01-24 NOTE — Progress Notes (Addendum)
Pulmonary Individual Treatment Plan  Patient Details  Name: Amanda Garrett MRN: [id number] Date of Birth: [birth year] Referring Provider:   [name] Pulmonary Rehab Walk Test from 01/24/2022 in [place]  Referring Provider [name]       Initial Encounter Date:  Flowsheet Row Pulmonary Rehab Walk Test from 01/24/2022 in [place]  Date 01/24/22       Visit Diagnosis: Stage 3 severe COPD by GOLD classification ([place])  Patient's Home Medications on Admission:   Current Outpatient Medications:    albuterol (PROVENTIL) (2.5 MG/3ML) 0.083% nebulizer solution, Take 2.5 mg by nebulization every 6 (six) hours as needed for wheezing or shortness of breath., Disp: , Rfl:    atorvastatin (LIPITOR) 10 MG tablet, Take 10 mg by mouth daily., Disp: , Rfl:    BAYER LOW DOSE 81 MG EC tablet, Take 81 mg by mouth daily. Swallow whole., Disp: , Rfl:    benzonatate (TESSALON) 200 MG capsule, Take 1 capsule (200 mg total) by mouth 3 (three) times daily as needed for cough., Disp: 30 capsule, Rfl: 1   Calcium-Magnesium-Zinc (CAL-MAG-ZINC PO), Take 1 tablet by mouth daily., Disp: , Rfl:    Cholecalciferol (VITAMIN D-3) 25 MCG (1000 UT) CAPS, Take 1,000 Units by mouth daily., Disp: , Rfl:    clobetasol ointment (TEMOVATE) 4.69 %, Apply 1 application topically daily as needed (in the perineal area)., Disp: , Rfl:    Cyanocobalamin (B-12) 2500 MCG TABS, Take 2,500 mcg by mouth daily., Disp: , Rfl:    famotidine (PEPCID) 20 MG tablet, Take 1 tablet (20 mg total) by mouth at bedtime. Please schedule an office visit for further refills. Thank you, Disp: 90 tablet, Rfl: 0   fluticasone (FLONASE) 50 MCG/ACT nasal spray, Place 2 sprays into both nostrils daily., Disp: 18.2 mL, Rfl: 2   Fluticasone-Umeclidin-Vilant (TRELEGY ELLIPTA) 200-62.5-25 MCG/ACT AEPB, Inhale 1 puff by mouth once daily, Disp: 60 each, Rfl: 5   Fluticasone-Umeclidin-Vilant  (TRELEGY ELLIPTA) 200-62.5-25 MCG/ACT AEPB, Inhale 1 puff into the lungs daily., Disp: 1 each, Rfl: 0   levocetirizine (XYZAL) 5 MG tablet, Take 1 tablet (5 mg total) by mouth every evening., Disp: 30 tablet, Rfl: 5   metFORMIN (GLUCOPHAGE) 850 MG tablet, Take 850 mg by mouth 2 (two) times daily with a meal., Disp: , Rfl:    Misc Natural Products (SAMBUCUS ELDERBERRY IMMUNE) SYRP, Take 5 mLs by mouth daily., Disp: , Rfl:    montelukast (SINGULAIR) 10 MG tablet, Take 10 mg by mouth at bedtime., Disp: , Rfl:    Multiple Vitamins-Minerals (AIRBORNE PO), Take 1 tablet by mouth daily., Disp: , Rfl:    Multiple Vitamins-Minerals (CENTRUM SILVER ULTRA WOMENS) TABS, Take 1 tablet by mouth daily., Disp: , Rfl:    OXYGEN, Inhale 1 L into the lungs continuous., Disp: , Rfl:    Potassium 99 MG TABS, Take 99 mg by mouth daily., Disp: , Rfl:    propranolol (INDERAL) 10 MG tablet, Take 10 mg by mouth 2 (two) times daily., Disp: , Rfl:    Sodium Chloride-Sodium Bicarb (AYR SALINE NASAL RINSE NA), Place 1 spray into the nose daily as needed (sinus rinse)., Disp: , Rfl:    triamcinolone (KENALOG) 0.025 % cream, Apply 1 application topically 2 (two) times daily as needed (rash)., Disp: , Rfl:    VENTOLIN HFA 108 (90 Base) MCG/ACT inhaler, Inhale 2 puffs into the lungs every 6 (six) hours as needed for wheezing or shortness of  breath., Disp: , Rfl:    vitamin C (ASCORBIC ACID) 500 MG tablet, Take 500 mg by mouth daily., Disp: , Rfl:   Past Medical History: Past Medical History:  Diagnosis Date   Asthma    COPD (chronic obstructive pulmonary disease) ([place])    Diabetes mellitus without complication ([place])    Emphysema of lung ([place])    GERD (gastroesophageal reflux disease)    Hypercholesteremia 2011   Hypertension    Lichen sclerosus et atrophicus    vulva   Oxygen deficiency    Palpitation    on Inderal    Tobacco Use: Social History   Tobacco Use  Smoking Status Former   Packs/day: 2.00   Years:  34.00   Total pack years: 68.00   Types: Cigarettes   Start date: 14   Quit date: 06/1996   Years since quitting: 25.6  Smokeless Tobacco Never    Labs: Review Flowsheet       Latest Ref Rng & Units 11/09/2020 11/11/2020 08/06/2021  Labs for ITP Cardiac and Pulmonary Rehab  Hemoglobin A1c 4.8 - 5.6 % 6.2  6.2  5.8   PH, Arterial 7.350 - 7.450 7.268  7.251  - -  PCO2 arterial 32.0 - 48.0 mmHg 65.3  76.6  - -  Bicarbonate 20.0 - 28.0 mmol/L 28.9  32.5  - -  O2 Saturation % 93.4  90.9  - -    Capillary Blood Glucose: Lab Results  Component Value Date   GLUCAP 182 (H) 08/08/2021   GLUCAP 95 08/08/2021   GLUCAP 163 (H) 08/07/2021   GLUCAP 126 (H) 08/07/2021   GLUCAP 179 (H) 08/07/2021     Pulmonary Assessment Scores:  Pulmonary Assessment Scores     Row Name 01/24/22 1106         ADL UCSD   ADL Phase Entry     SOB Score total 33       CAT Score   CAT Score 13       mMRC Score   mMRC Score 1             UCSD: Self-administered rating of dyspnea associated with activities of daily living (ADLs) 6-point scale (0 = "not at all" to 5 = "maximal or unable to do because of breathlessness")  Scoring Scores range from 0 to 120.  Minimally important difference is 5 units  CAT: CAT can identify the health impairment of COPD patients and is better correlated with disease progression.  CAT has a scoring range of zero to 40. The CAT score is classified into four groups of low (less than 10), medium (10 - 20), high (21-30) and very high (31-40) based on the impact level of disease on health status. A CAT score over 10 suggests significant symptoms.  A worsening CAT score could be explained by an exacerbation, poor medication adherence, poor inhaler technique, or progression of COPD or comorbid conditions.  CAT MCID is 2 points  mMRC: mMRC (Modified Medical Research Council) Dyspnea Scale is used to assess the degree of baseline functional disability in patients of  respiratory disease due to dyspnea. No minimal important difference is established. A decrease in score of 1 point or greater is considered a positive change.   Pulmonary Function Assessment:  Pulmonary Function Assessment - 01/24/22 1048       Breath   Bilateral Breath Sounds Clear;Decreased    Shortness of Breath Fear of Shortness of Breath;Limiting activity;Panic with Shortness of Breath;Yes  Exercise Target Goals: Exercise Program Goal: Individual exercise prescription set using results from initial 6 min walk test and THRR while considering  patient's activity barriers and safety.   Exercise Prescription Goal: Initial exercise prescription builds to 30-45 minutes a day of aerobic activity, 2-3 days per week.  Home exercise guidelines will be given to patient during program as part of exercise prescription that the participant will acknowledge.  Activity Barriers & Risk Stratification:  Activity Barriers & Cardiac Risk Stratification - 01/24/22 1049       Activity Barriers & Cardiac Risk Stratification   Activity Barriers Arthritis;Deconditioning;Muscular Weakness;Shortness of Breath;History of Falls    Cardiac Risk Stratification Low             6 Minute Walk:  6 Minute Walk     Row Name 01/24/22 1217         6 Minute Walk   Phase Initial     Distance 1032 feet     Walk Time 6 minutes     # of Rest Breaks 0     MPH 1.95     METS 2.28     RPE 13     Perceived Dyspnea  3     VO2 Peak 7.96     Symptoms No     Resting HR 85 bpm     Resting BP 130/72     Resting Oxygen Saturation  96 %     Exercise Oxygen Saturation  during 6 min walk 89 %     Max Ex. HR 114 bpm     Max Ex. BP 150/86     2 Minute Post BP 134/70       Interval HR   1 Minute HR 101     2 Minute HR 109     3 Minute HR 112     4 Minute HR 114     5 Minute HR 112     6 Minute HR 112     2 Minute Post HR 84     Interval Heart Rate? Yes       Interval Oxygen   Interval  Oxygen? Yes     Baseline Oxygen Saturation % 96 %     1 Minute Oxygen Saturation % 96 %     1 Minute Liters of Oxygen 2 L     2 Minute Oxygen Saturation % 92 %     2 Minute Liters of Oxygen 2 L     3 Minute Oxygen Saturation % 91 %     3 Minute Liters of Oxygen 2 L     4 Minute Oxygen Saturation % 90 %     4 Minute Liters of Oxygen 2 L     5 Minute Oxygen Saturation % 90 %     5 Minute Liters of Oxygen 2 L     6 Minute Oxygen Saturation % 89 %     6 Minute Liters of Oxygen 2 L     2 Minute Post Oxygen Saturation % 97 %     2 Minute Post Liters of Oxygen 2 L              Oxygen Initial Assessment:  Oxygen Initial Assessment - 01/24/22 1047       Home Oxygen   Home Oxygen Device E-Tanks;Home Concentrator    Sleep Oxygen Prescription Continuous    Liters per minute 2    Home Exercise Oxygen Prescription Continuous    Liters  per minute 2    Home Resting Oxygen Prescription Continuous    Liters per minute 2    Compliance with Home Oxygen Use Yes      Initial 6 min Walk   Oxygen Used Continuous    Liters per minute 2      Program Oxygen Prescription   Program Oxygen Prescription Continuous    Liters per minute 2      Intervention   Short Term Goals To learn and exhibit compliance with exercise, home and travel O2 prescription;To learn and understand importance of monitoring SPO2 with pulse oximeter and demonstrate accurate use of the pulse oximeter.;To learn and understand importance of maintaining oxygen saturations>88%;To learn and demonstrate proper pursed lip breathing techniques or other breathing techniques. ;To learn and demonstrate proper use of respiratory medications    Long  Term Goals Exhibits compliance with exercise, home  and travel O2 prescription;Verbalizes importance of monitoring SPO2 with pulse oximeter and return demonstration;Maintenance of O2 saturations>88%;Exhibits proper breathing techniques, such as pursed lip breathing or other method taught during  program session;Compliance with respiratory medication;Demonstrates proper use of MDI's             Oxygen Re-Evaluation:  Oxygen Re-Evaluation     Row Name 08/02/21 1149             Program Oxygen Prescription   Program Oxygen Prescription Continuous       Liters per minute 1       Comments O2 sat monitored and stable during exercise, 94% on 1L. O2 decreased to 1L.         Home Oxygen   Home Oxygen Device E-Tanks;Home Concentrator  DME: [employer]       Sleep Oxygen Prescription Continuous       Liters per minute 1       Home Exercise Oxygen Prescription Continuous       Liters per minute 1       Home Resting Oxygen Prescription Continuous       Liters per minute 1       Compliance with Home Oxygen Use Yes         Goals/Expected Outcomes   Short Term Goals To learn and exhibit compliance with exercise, home and travel O2 prescription;To learn and understand importance of monitoring SPO2 with pulse oximeter and demonstrate accurate use of the pulse oximeter.;To learn and understand importance of maintaining oxygen saturations>88%;To learn and demonstrate proper pursed lip breathing techniques or other breathing techniques. ;To learn and demonstrate proper use of respiratory medications       Long  Term Goals Exhibits compliance with exercise, home  and travel O2 prescription;Verbalizes importance of monitoring SPO2 with pulse oximeter and return demonstration;Maintenance of O2 saturations>88%;Exhibits proper breathing techniques, such as pursed lip breathing or other method taught during program session;Compliance with respiratory medication;Demonstrates proper use of MDI's       Goals/Expected Outcomes Compliance and understanding of oxygen saturation and breathing techniques to decrease shortness of breath                Oxygen Discharge (Final Oxygen Re-Evaluation):  Oxygen Re-Evaluation - 08/02/21 1149       Program Oxygen Prescription   Program Oxygen Prescription  Continuous    Liters per minute 1    Comments O2 sat monitored and stable during exercise, 94% on 1L. O2 decreased to 1L.      Home Oxygen   Home Oxygen Device E-Tanks;Home Concentrator   DME: [employer]  Sleep Oxygen Prescription Continuous    Liters per minute 1    Home Exercise Oxygen Prescription Continuous    Liters per minute 1    Home Resting Oxygen Prescription Continuous    Liters per minute 1    Compliance with Home Oxygen Use Yes      Goals/Expected Outcomes   Short Term Goals To learn and exhibit compliance with exercise, home and travel O2 prescription;To learn and understand importance of monitoring SPO2 with pulse oximeter and demonstrate accurate use of the pulse oximeter.;To learn and understand importance of maintaining oxygen saturations>88%;To learn and demonstrate proper pursed lip breathing techniques or other breathing techniques. ;To learn and demonstrate proper use of respiratory medications    Long  Term Goals Exhibits compliance with exercise, home  and travel O2 prescription;Verbalizes importance of monitoring SPO2 with pulse oximeter and return demonstration;Maintenance of O2 saturations>88%;Exhibits proper breathing techniques, such as pursed lip breathing or other method taught during program session;Compliance with respiratory medication;Demonstrates proper use of MDI's    Goals/Expected Outcomes Compliance and understanding of oxygen saturation and breathing techniques to decrease shortness of breath             Initial Exercise Prescription:  Initial Exercise Prescription - 01/24/22 1200       Date of Initial Exercise RX and Referring Provider   Date 01/24/22    Referring Provider [name]    Expected Discharge Date 03/31/22      Oxygen   Oxygen Continuous    Liters 2    Maintain Oxygen Saturation 88% or higher      Recumbant Bike   Level --    Watts --    Minutes --      NuStep   Level 1    SPM 60    Minutes 15      Arm Ergometer   Level 1     Watts 5    Minutes 15      Prescription Details   Frequency (times per week) 2    Duration Progress to 30 minutes of continuous aerobic without signs/symptoms of physical distress      Intensity   THRR 40-80% of Max Heartrate 61-122    Ratings of Perceived Exertion 11-13    Perceived Dyspnea 0-4      Progression   Progression Continue to progress workloads to maintain intensity without signs/symptoms of physical distress.      Resistance Training   Training Prescription Yes    Weight red bands    Reps 10-15             Perform Capillary Blood Glucose checks as needed.  Exercise Prescription Changes:   Exercise Prescription Changes     Row Name 07/29/21 1544             Response to Exercise   Blood Pressure (Admit) 130/82       Blood Pressure (Exercise) 144/80       Blood Pressure (Exit) 118/60       Heart Rate (Admit) 86 bpm       Heart Rate (Exercise) 92 bpm       Heart Rate (Exit) 76 bpm       Oxygen Saturation (Admit) 94 %       Oxygen Saturation (Exercise) 94 %       Oxygen Saturation (Exit) 95 %       Rating of Perceived Exertion (Exercise) 9       Perceived Dyspnea (Exercise) 1  Duration Progress to 30 minutes of  aerobic without signs/symptoms of physical distress       Intensity --  40-80% HRR         Resistance Training   Training Prescription Yes       Weight red bands       Reps 10-15       Time 10 Minutes         Oxygen   Oxygen Continuous       Liters 1         NuStep   Level 1       SPM 60       Minutes 30       METs 1.7         Oxygen   Maintain Oxygen Saturation 88% or higher                Exercise Comments:   Exercise Goals and Review:   Exercise Goals     Row Name 08/02/21 1144 01/24/22 1049           Exercise Goals   Increase Physical Activity Yes Yes      Intervention Provide advice, education, support and counseling about physical activity/exercise needs.;Develop an individualized exercise  prescription for aerobic and resistive training based on initial evaluation findings, risk stratification, comorbidities and participant's personal goals. Provide advice, education, support and counseling about physical activity/exercise needs.;Develop an individualized exercise prescription for aerobic and resistive training based on initial evaluation findings, risk stratification, comorbidities and participant's personal goals.      Expected Outcomes Short Term: Attend rehab on a regular basis to increase amount of physical activity.;Long Term: Add in home exercise to make exercise part of routine and to increase amount of physical activity.;Long Term: Exercising regularly at least 3-5 days a week. Short Term: Attend rehab on a regular basis to increase amount of physical activity.;Long Term: Add in home exercise to make exercise part of routine and to increase amount of physical activity.;Long Term: Exercising regularly at least 3-5 days a week.      Increase Strength and Stamina Yes Yes      Intervention Provide advice, education, support and counseling about physical activity/exercise needs.;Develop an individualized exercise prescription for aerobic and resistive training based on initial evaluation findings, risk stratification, comorbidities and participant's personal goals. Provide advice, education, support and counseling about physical activity/exercise needs.;Develop an individualized exercise prescription for aerobic and resistive training based on initial evaluation findings, risk stratification, comorbidities and participant's personal goals.      Expected Outcomes Short Term: Perform resistance training exercises routinely during rehab and add in resistance training at home;Short Term: Increase workloads from initial exercise prescription for resistance, speed, and METs.;Long Term: Improve cardiorespiratory fitness, muscular endurance and strength as measured by increased METs and functional  capacity (6MWT) Short Term: Perform resistance training exercises routinely during rehab and add in resistance training at home;Short Term: Increase workloads from initial exercise prescription for resistance, speed, and METs.;Long Term: Improve cardiorespiratory fitness, muscular endurance and strength as measured by increased METs and functional capacity (6MWT)      Able to understand and use rate of perceived exertion (RPE) scale Yes Yes      Intervention Provide education and explanation on how to use RPE scale Provide education and explanation on how to use RPE scale      Expected Outcomes Short Term: Able to use RPE daily in rehab to express subjective intensity level;Long Term:  Able to  use RPE to guide intensity level when exercising independently Short Term: Able to use RPE daily in rehab to express subjective intensity level;Long Term:  Able to use RPE to guide intensity level when exercising independently      Able to understand and use Dyspnea scale Yes Yes      Intervention Provide education and explanation on how to use Dyspnea scale Provide education and explanation on how to use Dyspnea scale      Expected Outcomes Short Term: Able to use Dyspnea scale daily in rehab to express subjective sense of shortness of breath during exertion;Long Term: Able to use Dyspnea scale to guide intensity level when exercising independently Short Term: Able to use Dyspnea scale daily in rehab to express subjective sense of shortness of breath during exertion;Long Term: Able to use Dyspnea scale to guide intensity level when exercising independently      Knowledge and understanding of Target Heart Rate Range (THRR) Yes Yes      Intervention Provide education and explanation of THRR including how the numbers were predicted and where they are located for reference Provide education and explanation of THRR including how the numbers were predicted and where they are located for reference      Expected Outcomes  Short Term: Able to state/look up THRR;Long Term: Able to use THRR to govern intensity when exercising independently;Short Term: Able to use daily as guideline for intensity in rehab Short Term: Able to state/look up THRR;Long Term: Able to use THRR to govern intensity when exercising independently;Short Term: Able to use daily as guideline for intensity in rehab      Understanding of Exercise Prescription Yes Yes      Intervention Provide education, explanation, and written materials on patient's individual exercise prescription Provide education, explanation, and written materials on patient's individual exercise prescription      Expected Outcomes Short Term: Able to explain program exercise prescription;Long Term: Able to explain home exercise prescription to exercise independently Short Term: Able to explain program exercise prescription;Long Term: Able to explain home exercise prescription to exercise independently               Exercise Goals Re-Evaluation :  Exercise Goals Re-Evaluation     Row Name 08/02/21 1144             Exercise Goal Re-Evaluation   Exercise Goals Review Increase Physical Activity;Increase Strength and Stamina;Able to understand and use rate of perceived exertion (RPE) scale;Able to understand and use Dyspnea scale;Knowledge and understanding of Target Heart Rate Range (THRR);Understanding of Exercise Prescription       Comments Janalee has completed 2 exercise sessions Niana exercises for 30 min on the Nustep at level 1. She averages 1.7 METs. Mumtaz performed the warmup and cooldown standing without limitations. She is very deconditioned and she knows this. Her hope is to improve her functional capacity. She is motivated to exercise. I discussed with her possibly moving to the track for 10 min in the future. She agreed with my recommendations. It is too soon to note and discernable progressions. Will continue to monitor and progress as able.       Expected  Outcomes Through exercise at rehab and home, the patient will decrease shortness of breath with daily activities and feel confident in carrying out an exercise regimen at home.                Discharge Exercise Prescription (Final Exercise Prescription Changes):  Exercise Prescription Changes - 07/29/21 1544  Response to Exercise   Blood Pressure (Admit) 130/82    Blood Pressure (Exercise) 144/80    Blood Pressure (Exit) 118/60    Heart Rate (Admit) 86 bpm    Heart Rate (Exercise) 92 bpm    Heart Rate (Exit) 76 bpm    Oxygen Saturation (Admit) 94 %    Oxygen Saturation (Exercise) 94 %    Oxygen Saturation (Exit) 95 %    Rating of Perceived Exertion (Exercise) 9    Perceived Dyspnea (Exercise) 1    Duration Progress to 30 minutes of  aerobic without signs/symptoms of physical distress    Intensity --   40-80% HRR     Resistance Training   Training Prescription Yes    Weight red bands    Reps 10-15    Time 10 Minutes      Oxygen   Oxygen Continuous    Liters 1      NuStep   Level 1    SPM 60    Minutes 30    METs 1.7      Oxygen   Maintain Oxygen Saturation 88% or higher             Nutrition:  Target Goals: Understanding of nutrition guidelines, daily intake of sodium <1532m, cholesterol <2088m calories 30% from fat and 7% or less from saturated fats, daily to have 5 or more servings of fruits and vegetables.  Biometrics:    Nutrition Therapy Plan and Nutrition Goals:   Nutrition Assessments:  MEDIFICTS Score Key: ?70 Need to make dietary changes  40-70 Heart Healthy Diet ? 40 Therapeutic Level Cholesterol Diet   Picture Your Plate Scores: <4<42nhealthy dietary pattern with much room for improvement. 41-50 Dietary pattern unlikely to meet recommendations for good health and room for improvement. 51-60 More healthful dietary pattern, with some room for improvement.  >60 Healthy dietary pattern, although there may be some specific  behaviors that could be improved.    Nutrition Goals Re-Evaluation:   Nutrition Goals Discharge (Final Nutrition Goals Re-Evaluation):   Psychosocial: Target Goals: Acknowledge presence or absence of significant depression and/or stress, maximize coping skills, provide positive support system. Participant is able to verbalize types and ability to use techniques and skills needed for reducing stress and depression.  Initial Review & Psychosocial Screening:  Initial Psych Review & Screening - 01/24/22 1046       Initial Review   Current issues with None Identified      Family Dynamics   Good Support System? Yes    Comments family      Barriers   Psychosocial barriers to participate in program There are no identifiable barriers or psychosocial needs.      Screening Interventions   Interventions Encouraged to exercise             Quality of Life Scores:  Scores of 19 and below usually indicate a poorer quality of life in these areas.  A difference of  2-3 points is a clinically meaningful difference.  A difference of 2-3 points in the total score of the Quality of Life Index has been associated with significant improvement in overall quality of life, self-image, physical symptoms, and general health in studies assessing change in quality of life.  PHQ-9: Review Flowsheet       01/24/2022 07/23/2021  Depression screen PHQ 2/9  Decreased Interest 0 1  Down, Depressed, Hopeless 0 1  PHQ - 2 Score 0 2  Altered sleeping 0 1  Tired,  decreased energy 0 1  Change in appetite 0 0  Feeling bad or failure about yourself  0 0  Trouble concentrating 0 0  Moving slowly or fidgety/restless 0 0  Suicidal thoughts 0 0  PHQ-9 Score 0 4  Difficult doing work/chores - Somewhat difficult   Interpretation of Total Score  Total Score Depression Severity:  1-4 = Minimal depression, 5-9 = Mild depression, 10-14 = Moderate depression, 15-19 = Moderately severe depression, 20-27 = Severe  depression   Psychosocial Evaluation and Intervention:  Psychosocial Evaluation - 01/24/22 1046       Psychosocial Evaluation & Interventions   Interventions Encouraged to exercise with the program and follow exercise prescription    Expected Outcomes For Angelene to participate in Pulmonary Rehab    Continue Psychosocial Services  Follow up required by staff             Psychosocial Re-Evaluation:  Psychosocial Re-Evaluation     [place] Name 08/11/21 1150             Psychosocial Re-Evaluation   Current issues with None Identified       Comments Tonni has attended only 2 exercise sessions thus far. She was absent due to breathing difficulties, then was admitted to the hospital 08/05/21 through 08/08/21  with COPD exacerbation. I am unable to re evaluate her psychosocial barriers at this time since she has not returned.       Expected Outcomes For Alexiz to attend PR without psychosocial barriers.       Interventions Encouraged to attend Pulmonary Rehabilitation for the exercise       Continue Psychosocial Services  No Follow up required                Psychosocial Discharge (Final Psychosocial Re-Evaluation):  Psychosocial Re-Evaluation - 08/11/21 1150       Psychosocial Re-Evaluation   Current issues with None Identified    Comments Lashena has attended only 2 exercise sessions thus far. She was absent due to breathing difficulties, then was admitted to the hospital 08/05/21 through 08/08/21  with COPD exacerbation. I am unable to re evaluate her psychosocial barriers at this time since she has not returned.    Expected Outcomes For Chaya to attend PR without psychosocial barriers.    Interventions Encouraged to attend Pulmonary Rehabilitation for the exercise    Continue Psychosocial Services  No Follow up required             Education: Education Goals: Education classes will be provided on a weekly basis, covering required topics. Participant will state  understanding/return demonstration of topics presented.  Learning Barriers/Preferences:  Learning Barriers/Preferences - 01/24/22 1046       Learning Barriers/Preferences   Learning Barriers None    Learning Preferences Individual Instruction;Computer/Internet;Pictoral;Written Material;Video;Verbal Instruction             Education Topics: Risk Factor Reduction:  -Group instruction that is supported by a PowerPoint presentation. Instructor discusses the definition of a risk factor, different risk factors for pulmonary disease, and how the heart and lungs work together.     Nutrition for Pulmonary Patient:  -Group instruction provided by PowerPoint slides, verbal discussion, and written materials to support subject matter. The instructor gives an explanation and review of healthy diet recommendations, which includes a discussion on weight management, recommendations for fruit and vegetable consumption, as well as protein, fluid, caffeine, fiber, sodium, sugar, and alcohol. Tips for eating when patients are short of breath are discussed.  Pursed Lip Breathing:  -Group instruction that is supported by demonstration and informational handouts. Instructor discusses the benefits of pursed lip and diaphragmatic breathing and detailed demonstration on how to preform both.     Oxygen Safety:  -Group instruction provided by PowerPoint, verbal discussion, and written material to support subject matter. There is an overview of "What is Oxygen" and "Why do we need it".  Instructor also reviews how to create a safe environment for oxygen use, the importance of using oxygen as prescribed, and the risks of noncompliance. There is a brief discussion on traveling with oxygen and resources the patient may utilize.   Oxygen Equipment:  -Group instruction provided by [facility] Staff utilizing handouts, written materials, and equipment demonstrations.   Signs and Symptoms:  -Group instruction  provided by written material and verbal discussion to support subject matter. Warning signs and symptoms of infection, stroke, and heart attack are reviewed and when to call the physician/911 reinforced. Tips for preventing the spread of infection discussed.   Advanced Directives:  -Group instruction provided by verbal instruction and written material to support subject matter. Instructor reviews Advanced Directive laws and proper instruction for filling out document.   Pulmonary Video:  -Group video education that reviews the importance of medication and oxygen compliance, exercise, good nutrition, pulmonary hygiene, and pursed lip and diaphragmatic breathing for the pulmonary patient.   Exercise for the Pulmonary Patient:  -Group instruction that is supported by a PowerPoint presentation. Instructor discusses benefits of exercise, core components of exercise, frequency, duration, and intensity of an exercise routine, importance of utilizing pulse oximetry during exercise, safety while exercising, and options of places to exercise outside of rehab.     Pulmonary Medications:  -Verbally interactive group education provided by instructor with focus on inhaled medications and proper administration.   Anatomy and Physiology of the Respiratory System and Intimacy:  -Group instruction provided by PowerPoint, verbal discussion, and written material to support subject matter. Instructor reviews respiratory cycle and anatomical components of the respiratory system and their functions. Instructor also reviews differences in obstructive and restrictive respiratory diseases with examples of each. Intimacy, Sex, and Sexuality differences are reviewed with a discussion on how relationships can change when diagnosed with pulmonary disease. Common sexual concerns are reviewed.   MD DAY -A group question and answer session with a medical doctor that allows participants to ask questions that relate to their  pulmonary disease state.   OTHER EDUCATION -Group or individual verbal, written, or video instructions that support the educational goals of the pulmonary rehab program. [place] from 07/29/2021 in [place]  Date 07/29/21  Educator [employer] H/O  Instruction Review Code 1- Verbalizes Understanding       Holiday Eating Survival Tips:  -Group instruction provided by [employer], verbal discussion, and written materials to support subject matter. The instructor gives patients tips, tricks, and techniques to help them not only survive but enjoy the holidays despite the onslaught of food that accompanies the holidays.   Knowledge Questionnaire Score:   Core Components/Risk Factors/Patient Goals at Admission:  Personal Goals and Risk Factors at Admission - 01/24/22 1047       Core Components/Risk Factors/Patient Goals on Admission    Weight Management --    Intervention --    Expected Outcomes --    Improve shortness of breath with ADL's Yes    Intervention Provide education, individualized exercise plan and daily activity instruction to  help decrease symptoms of SOB with activities of daily living.    Expected Outcomes Short Term: Improve cardiorespiratory fitness to achieve a reduction of symptoms when performing ADLs;Long Term: Be able to perform more ADLs without symptoms or delay the onset of symptoms    Increase knowledge of respiratory medications and ability to use respiratory devices properly  Yes    Intervention Provide education and demonstration as needed of appropriate use of medications, inhalers, and oxygen therapy.    Expected Outcomes Short Term: Achieves understanding of medications use. Understands that oxygen is a medication prescribed by physician. Demonstrates appropriate use of inhaler and oxygen therapy.;Long Term: Maintain appropriate use of medications, inhalers, and  oxygen therapy.             Core Components/Risk Factors/Patient Goals Review:   Goals and Risk Factor Review     Row Name 08/11/21 1156             Core Components/Risk Factors/Patient Goals Review   Personal Goals Review Weight Management/Obesity;Improve shortness of breath with ADL's;Develop more efficient breathing techniques such as purse lipped breathing and diaphragmatic breathing and practicing self-pacing with activity.;Increase knowledge of respiratory medications and ability to use respiratory devices properly.       Review I am unable to reevaluate her core components due to her only attendig 2 sessions thus far. She has been hospitalized with COPD exacerbation and has not returned yet.       Expected Outcomes Weight loss, improved SOB with ADL's and more efficient breathing techniques with her return and progress with exercise.                Core Components/Risk Factors/Patient Goals at Discharge (Final Review):   Goals and Risk Factor Review - 08/11/21 1156       Core Components/Risk Factors/Patient Goals Review   Personal Goals Review Weight Management/Obesity;Improve shortness of breath with ADL's;Develop more efficient breathing techniques such as purse lipped breathing and diaphragmatic breathing and practicing self-pacing with activity.;Increase knowledge of respiratory medications and ability to use respiratory devices properly.    Review I am unable to reevaluate her core components due to her only attendig 2 sessions thus far. She has been hospitalized with COPD exacerbation and has not returned yet.    Expected Outcomes Weight loss, improved SOB with ADL's and more efficient breathing techniques with her return and progress with exercise.             ITP Comments: Dr. Rodman Pickle is Medical Director for Pulmonary Rehab at [facility].

## 2022-01-24 NOTE — Progress Notes (Signed)
Pulmonary Rehab Orientation Physical Assessment Note  Physical assessment reveals  Pt is alert and oriented x 3.  Heart rate is  irregular. Pt has history of "palpitations".  Reviewed most recent ECG which showed freq PVC.  Pt is on propanol. , breath sounds clear to auscultation, no wheezes, rales, or rhonchi however diminished throughout with short expiration.  Reports productive at times mostly non-productive cough. Bowel sounds present.  Pt denies abdominal discomfort, nausea, vomiting or diarrhea. Grip strength equal, strong. Distal pulses palpable; no swelling to lower extremities. Cherre Huger, BSN Cardiac and Training and development officer

## 2022-01-24 NOTE — Progress Notes (Signed)
Amanda Garrett 66 y.o. female  Initial Psychosocial Assessment  Pt psychosocial assessment reveals pt lives with their spouse. Pt is currently retired. Pt hobbies include dancing and spending time with family and friends. Pt reports her stress level is moderate. Areas of stress/anxiety include Health.  Pt does not exhibit signs of depression. Pt shows good  coping skills with positive outlook. Garrie feels that she is in a better place to complete Pulmonary Rehab. Offered emotional support and reassurance. Will continue to monitor.   01/24/2022 12:14 PM

## 2022-01-24 NOTE — Progress Notes (Signed)
Amanda Garrett 66 y.o. female Pulmonary Rehab Orientation Note This patient who was referred to Pulmonary Rehab by Dr. [name] with the diagnosis of COPD stage 3 arrived today in Cardiac and Pulmonary Rehab. She  arrived ambulatory with normal gait. She  does carry portable oxygen. [employer] is the provider for their DME. Per patient, Amanda Garrett uses oxygen continuously. Color good, skin warm and dry. Patient is oriented to time and place. Patient's medical history, psychosocial health, and medications reviewed. Psychosocial assessment reveals patient lives with spouse. Amanda Garrett is currently retired. Patient hobbies include dancing and spending time with family and friends. Patient reports her stress level is moderate. Areas of stress/anxiety include health. Patient does not exhibit signs of depression. PHQ2/9 score 0/0. Amanda Garrett shows good  coping skills with positive outlook on life. Offered emotional support and reassurance. Will continue to monitor. Physical assessment performed by Maurice Small RN. Please see their orientation physical assessment note. Amanda Garrett reports she does take medications as prescribed. Patient states she follows a diabetic diet. The patient has been trying to lose weight through a healthy diet and exercise program.. Patient's weight will be monitored closely. Demonstration and practice of PLB using pulse oximeter. Amanda Garrett able to return demonstration satisfactorily. Safety and hand hygiene in the exercise area reviewed with patient. Any voices understanding of the information reviewed. Department expectations discussed with patient and achievable goals were set. The patient shows enthusiasm about attending the program and we look forward to working with Amanda Garrett. Amanda Garrett completed a 6 min walk test today and is scheduled to begin exercise on 02/01/22 at 1:15 pm.   1030-1200 Amanda Plumber, MS, ACSM-CEP

## 2022-02-01 ENCOUNTER — Encounter (HOSPITAL_COMMUNITY)
Admission: RE | Admit: 2022-02-01 | Discharge: 2022-02-01 | Disposition: A | Discharge status: Home / Self Care | Payer: Medicare Other | Source: Ambulatory Visit | Attending: Internal Medicine | Admitting: Internal Medicine

## 2022-02-01 VITALS — Wt 197.8 lb

## 2022-02-01 DIAGNOSIS — J449 Chronic obstructive pulmonary disease, unspecified: Secondary | ICD-10-CM

## 2022-02-01 LAB — GLUCOSE, CAPILLARY
Glucose-Capillary: 107 mg/dL — ABNORMAL HIGH (ref 70–99)
Glucose-Capillary: 98 mg/dL (ref 70–99)

## 2022-02-01 NOTE — Progress Notes (Signed)
Daily Session Note  Patient Details  Name: Amanda Garrett MRN: [id number] Date of Birth: [birth year] Referring Provider:   [name] Pulmonary Rehab Walk Test from 01/24/2022 in [place]  Referring Provider [name]       Encounter Date: 02/01/2022  Check In:  Session Check In - 02/01/22 1517       Check-In   Supervising physician immediately available to respond to emergencies [facility] immediately available    Physician(s) Dr. Lonny Prude    Location [facility]    Staff Present Rosebud Poles, RN, Quentin Ore, MS, ACSM-CEP, Exercise Physiologist;Randi Yevonne Pax, ACSM-CEP, Exercise Physiologist;Lisa Ysidro Evert, RN    Virtual Visit No    Medication changes reported     No    Fall or balance concerns reported    No    Tobacco Cessation No Change    Warm-up and Cool-down Performed as group-led instruction    Resistance Training Performed Yes    VAD Patient? No    PAD/SET Patient? No      Pain Assessment   Currently in Pain? No/denies    Multiple Pain Sites No             Capillary Blood Glucose: Results for orders placed or performed during the hospital encounter of 02/01/22 (from the past 24 hour(s))  Glucose, capillary     Status: None   Collection Time: 02/01/22  2:28 PM  Result Value Ref Range   Glucose-Capillary 98 70 - 99 mg/dL     Exercise Prescription Changes - 02/01/22 1500       Response to Exercise   Blood Pressure (Admit) 132/76    Blood Pressure (Exercise) 126/70    Blood Pressure (Exit) 130/76    Heart Rate (Admit) 70 bpm    Heart Rate (Exercise) 96 bpm    Heart Rate (Exit) 87 bpm    Oxygen Saturation (Admit) 97 %    Oxygen Saturation (Exercise) 96 %    Oxygen Saturation (Exit) 93 %    Rating of Perceived Exertion (Exercise) 13    Perceived Dyspnea (Exercise) 1    Duration Progress to 30 minutes of  aerobic without signs/symptoms of physical distress    Intensity THRR unchanged       Resistance Training   Training Prescription Yes    Weight red bands    Reps 10-15    Time 10 Minutes      Oxygen   Oxygen Continuous    Liters 2      NuStep   Level 1    SPM 60    Minutes 15    METs 1.6      Arm Ergometer   Level 1    Minutes 15    METs 1.6      Oxygen   Maintain Oxygen Saturation 88% or higher             Social History   Tobacco Use  Smoking Status Former   Packs/day: 2.00   Years: 34.00   Total pack years: 68.00   Types: Cigarettes   Start date: 47   Quit date: 06/1996   Years since quitting: 25.6  Smokeless Tobacco Never    Goals Met:  Proper associated with RPD/PD & O2 Sat Exercise tolerated well No report of concerns or symptoms today Strength training completed today  Goals Unmet:  Not Applicable  Comments: Service time is from 1310 to 1440.    Dr. Rodman Pickle  is Market researcher for Pulmonary Rehab at [facility].

## 2022-02-03 ENCOUNTER — Encounter (HOSPITAL_COMMUNITY)
Admission: RE | Admit: 2022-02-03 | Discharge: 2022-02-03 | Disposition: A | Discharge status: Home / Self Care | Payer: Medicare Other | Source: Ambulatory Visit | Attending: Internal Medicine | Admitting: Internal Medicine

## 2022-02-03 DIAGNOSIS — J449 Chronic obstructive pulmonary disease, unspecified: Secondary | ICD-10-CM

## 2022-02-03 LAB — GLUCOSE, CAPILLARY
Glucose-Capillary: 93 mg/dL (ref 70–99)
Glucose-Capillary: 103 mg/dL — ABNORMAL HIGH (ref 70–99)

## 2022-02-03 NOTE — Progress Notes (Signed)
Daily Session Note  Patient Details  Name: Amanda Garrett MRN: [id number] Date of Birth: 1956/03/17 Referring Provider:   [name] Pulmonary Rehab Walk Test from 01/24/2022 in [place]  Referring Provider [name]       Encounter Date: 02/03/2022  Check In:  Session Check In - 02/03/22 1509       Check-In   Supervising physician immediately available to respond to emergencies [facility] immediately available    Physician(s) Dr. Florene Glen    Location [facility]    Staff Present Rosebud Poles, RN, Quentin Ore, MS, ACSM-CEP, Exercise Physiologist;Randi Yevonne Pax, ACSM-CEP, Exercise Physiologist;Carlette Wilber Oliphant, RN, Roque Cash, RN    Virtual Visit No    Medication changes reported     No    Fall or balance concerns reported    No    Tobacco Cessation No Change    Warm-up and Cool-down Performed as group-led instruction    Resistance Training Performed Yes    VAD Patient? No    PAD/SET Patient? No      Pain Assessment   Currently in Pain? No/denies    Multiple Pain Sites No             Capillary Blood Glucose: Results for orders placed or performed during the hospital encounter of 02/03/22 (from the past 24 hour(s))  Glucose, capillary     Status: Abnormal   Collection Time: 02/03/22  2:15 PM  Result Value Ref Range   Glucose-Capillary 103 (H) 70 - 99 mg/dL      Social History   Tobacco Use  Smoking Status Former   Packs/day: 2.00   Years: 34.00   Total pack years: 68.00   Types: Cigarettes   Start date: 36   Quit date: 06/1996   Years since quitting: 25.6  Smokeless Tobacco Never    Goals Met:  Proper associated with RPD/PD & O2 Sat Exercise tolerated well No report of concerns or symptoms today Strength training completed today  Goals Unmet:  Not Applicable  Comments: Service time is from 1318 to [place]    Dr. Rodman Pickle is Medical Director for Pulmonary Rehab at [facility].

## 2022-02-08 ENCOUNTER — Encounter (HOSPITAL_COMMUNITY)
Admission: RE | Admit: 2022-02-08 | Discharge: 2022-02-08 | Disposition: A | Discharge status: Home / Self Care | Payer: Medicare Other | Source: Ambulatory Visit | Attending: Internal Medicine | Admitting: Internal Medicine

## 2022-02-08 DIAGNOSIS — J449 Chronic obstructive pulmonary disease, unspecified: Secondary | ICD-10-CM | POA: Diagnosis not present

## 2022-02-08 NOTE — Progress Notes (Signed)
Daily Session Note  Patient Details  Name: Amanda Garrett MRN: [id number] Date of Birth: 1955/06/27 Referring Provider:   [name] Pulmonary Rehab Walk Test from 01/24/2022 in [place]  Referring Provider [name]       Encounter Date: 02/08/2022  Check In:  Session Check In - 02/08/22 1420       Check-In   Supervising physician immediately available to respond to emergencies [facility] immediately available    Physician(s) Dr. Florene Glen    [facility]    Staff Present Rosebud Poles, RN, BSN;Randi Olen Cordial BS, ACSM-CEP, Exercise Physiologist;Kaylee Rosana Hoes, MS, ACSM-CEP, Exercise Physiologist;Lisa Ysidro Evert, RN    Virtual Visit No    Medication changes reported     No    Fall or balance concerns reported    No    Tobacco Cessation No Change    Warm-up and Cool-down Performed as group-led instruction    Resistance Training Performed Yes    VAD Patient? No    PAD/SET Patient? No      Pain Assessment   Currently in Pain? No/denies    Multiple Pain Sites No             Capillary Blood Glucose: No results found for this or any previous visit (from the past 24 hour(s)).    Social History   Tobacco Use  Smoking Status Former   Packs/day: 2.00   Years: 34.00   Total pack years: 68.00   Types: Cigarettes   Start date: 1965   Quit date: 06/1996   Years since quitting: 25.6  Smokeless Tobacco Never    Goals Met:  Proper associated with RPD/PD & O2 Sat Exercise tolerated well No report of concerns or symptoms today Strength training completed today  Goals Unmet:  Not Applicable  Comments: Service time is from 1316 to 1440.    Dr. Rodman Pickle is Medical Director for Pulmonary Rehab at [facility].

## 2022-02-10 ENCOUNTER — Encounter (HOSPITAL_COMMUNITY)
Admission: RE | Admit: 2022-02-10 | Discharge: 2022-02-10 | Disposition: A | Discharge status: Home / Self Care | Payer: Medicare Other | Source: Ambulatory Visit | Attending: Internal Medicine | Admitting: Internal Medicine

## 2022-02-10 DIAGNOSIS — J449 Chronic obstructive pulmonary disease, unspecified: Secondary | ICD-10-CM

## 2022-02-10 NOTE — Progress Notes (Signed)
Daily Session Note  Patient Details  Name: Amanda Garrett MRN: [id number] Date of Birth: November 11, 1955 Referring Provider:   [name] Pulmonary Rehab Walk Test from 01/24/2022 in [place]  Referring Provider [name]       Encounter Date: 02/10/2022  Check In:  Session Check In - 02/10/22 1414       Check-In   Supervising physician immediately available to respond to emergencies [facility] immediately available    Physician(s) Dr. Verlon Au    [facility]    Staff Present Rosebud Poles, RN, BSN;Randi Olen Cordial BS, ACSM-CEP, Exercise Physiologist;Kaylee Rosana Hoes, MS, ACSM-CEP, Exercise Physiologist;Sylvester Minton Ysidro Evert, RN    Virtual Visit No    Medication changes reported     No    Fall or balance concerns reported    No    Tobacco Cessation No Change    Warm-up and Cool-down Performed as group-led instruction    Resistance Training Performed Yes    VAD Patient? No    PAD/SET Patient? No      Pain Assessment   Currently in Pain? No/denies    Multiple Pain Sites No             Capillary Blood Glucose: No results found for this or any previous visit (from the past 24 hour(s)).    Social History   Tobacco Use  Smoking Status Former   Packs/day: 2.00   Years: 34.00   Total pack years: 68.00   Types: Cigarettes   Start date: 1965   Quit date: 06/1996   Years since quitting: 25.6  Smokeless Tobacco Never    Goals Met:  Proper associated with RPD/PD & O2 Sat Exercise tolerated well No report of concerns or symptoms today Strength training completed today  Goals Unmet:  Not Applicable  Comments: Service time is from 1317 to 1440 Pt's BP is running in the 037'C and 488'Q systolic. Noted when listening to er BP she has skipped beats. She states the she had this for years and is on medication for it. She seen Dr. Domenic Polite in the past. We have encouraged her to call the cardiologist office and see if she could get  an appointment. She states that she will call.    Dr. Rodman Pickle is Medical Director for Pulmonary Rehab at [facility].

## 2022-02-11 ENCOUNTER — Telehealth (HOSPITAL_COMMUNITY): Payer: Self-pay

## 2022-02-11 NOTE — Telephone Encounter (Signed)
Received a Advertising account executive from [place]. Returned [employer]. Miracle saw her PCP and they are requesting her chart from [place]. Bobbies consented to her chart being faxed to her PCP.

## 2022-02-15 ENCOUNTER — Telehealth (HOSPITAL_COMMUNITY): Payer: Self-pay | Admitting: *Deleted

## 2022-02-15 ENCOUNTER — Encounter (HOSPITAL_COMMUNITY)
Admission: RE | Admit: 2022-02-15 | Discharge: 2022-02-15 | Disposition: A | Discharge status: Home / Self Care | Payer: Medicare Other | Source: Ambulatory Visit | Attending: Internal Medicine | Admitting: Internal Medicine

## 2022-02-15 VITALS — Wt 198.0 lb

## 2022-02-15 DIAGNOSIS — J449 Chronic obstructive pulmonary disease, unspecified: Secondary | ICD-10-CM | POA: Diagnosis present

## 2022-02-15 NOTE — Progress Notes (Signed)
Daily Session Note  Patient Details  Name: Amanda Garrett MRN: [id number] Date of Birth: 05-11-1956 Referring Provider:   [name] Pulmonary Rehab Walk Test from 01/24/2022 in [place]  Referring Provider [name]       Encounter Date: 02/15/2022  Check In:  Session Check In - 02/15/22 1428       Check-In   Supervising physician immediately available to respond to emergencies [facility] immediately available    Physician(s) Dr. Verlon Au    Location [facility]    Staff Present Rosebud Poles, RN, BSN;Randi Olen Cordial BS, ACSM-CEP, Exercise Physiologist;Kaylee Rosana Hoes, MS, ACSM-CEP, Exercise Physiologist;Lisa Ysidro Evert, RN    Virtual Visit No    Medication changes reported     No    Fall or balance concerns reported    No    Tobacco Cessation No Change    Warm-up and Cool-down Performed as group-led instruction    Resistance Training Performed Yes    VAD Patient? No    PAD/SET Patient? No      Pain Assessment   Currently in Pain? No/denies    Multiple Pain Sites No             Capillary Blood Glucose: No results found for this or any previous visit (from the past 24 hour(s)).   Exercise Prescription Changes - 02/15/22 1500       Response to Exercise   Blood Pressure (Admit) 160/82    Blood Pressure (Exercise) 180/90    Blood Pressure (Exit) 164/72    Heart Rate (Admit) 89 bpm    Heart Rate (Exercise) 106 bpm    Heart Rate (Exit) 97 bpm    Oxygen Saturation (Admit) 96 %    Oxygen Saturation (Exercise) 93 %    Oxygen Saturation (Exit) 96 %    Rating of Perceived Exertion (Exercise) 13    Perceived Dyspnea (Exercise) 1    Duration Continue with 30 min of aerobic exercise without signs/symptoms of physical distress.    Intensity THRR unchanged      Resistance Training   Training Prescription Yes    Weight red bands    Reps 10-15    Time 10 Minutes      Oxygen   Oxygen Continuous    Liters 2      NuStep    Level 4    SPM 80    Minutes 15    METs 2      Arm Ergometer   Level 4    Minutes 15    METs 2.8             Social History   Tobacco Use  Smoking Status Former   Packs/day: 2.00   Years: 34.00   Total pack years: 68.00   Types: Cigarettes   Start date: 54   Quit date: 06/1996   Years since quitting: 25.6  Smokeless Tobacco Never    Goals Met:  Proper associated with RPD/PD & O2 Sat Exercise tolerated well No report of concerns or symptoms today Strength training completed today  Goals Unmet:  Not Applicable  Comments: Service time is from 1305 to 1433.    Dr. Rodman Pickle is Medical Director for Pulmonary Rehab at [facility].

## 2022-02-15 NOTE — Telephone Encounter (Signed)
Called to report elevated blood pressure readings for Amanda Garrett in pulmonary rehab the last 3 weeks.  They range from 150/80-180/90 either at rest or during exercise.  Katie took the readings and will pass this onto Dr. Arelia Sneddon.   They will contact patient with further instructions.

## 2022-02-16 NOTE — Progress Notes (Signed)
Pulmonary Individual Treatment Plan  Patient Details  Name: Amanda Garrett MRN: [id number] Date of Birth: [birth year] Referring Provider:   [name] Pulmonary Rehab Walk Test from 01/24/2022 in [place]  Referring Provider [name]       Initial Encounter Date:  Flowsheet Row Pulmonary Rehab Walk Test from 01/24/2022 in [place]  Date 01/24/22       Visit Diagnosis: Stage 3 severe COPD by GOLD classification ([place])  Patient's Home Medications on Admission:   Current Outpatient Medications:    albuterol (PROVENTIL) (2.5 MG/3ML) 0.083% nebulizer solution, Take 2.5 mg by nebulization every 6 (six) hours as needed for wheezing or shortness of breath., Disp: , Rfl:    atorvastatin (LIPITOR) 10 MG tablet, Take 10 mg by mouth daily., Disp: , Rfl:    BAYER LOW DOSE 81 MG EC tablet, Take 81 mg by mouth daily. Swallow whole., Disp: , Rfl:    benzonatate (TESSALON) 200 MG capsule, Take 1 capsule (200 mg total) by mouth 3 (three) times daily as needed for cough., Disp: 30 capsule, Rfl: 1   Calcium-Magnesium-Zinc (CAL-MAG-ZINC PO), Take 1 tablet by mouth daily., Disp: , Rfl:    Cholecalciferol (VITAMIN D-3) 25 MCG (1000 UT) CAPS, Take 1,000 Units by mouth daily., Disp: , Rfl:    clobetasol ointment (TEMOVATE) 4.69 %, Apply 1 application topically daily as needed (in the perineal area)., Disp: , Rfl:    Cyanocobalamin (B-12) 2500 MCG TABS, Take 2,500 mcg by mouth daily., Disp: , Rfl:    famotidine (PEPCID) 20 MG tablet, Take 1 tablet (20 mg total) by mouth at bedtime. Please schedule an office visit for further refills. Thank you, Disp: 90 tablet, Rfl: 0   fluticasone (FLONASE) 50 MCG/ACT nasal spray, Place 2 sprays into both nostrils daily., Disp: 18.2 mL, Rfl: 2   Fluticasone-Umeclidin-Vilant (TRELEGY ELLIPTA) 200-62.5-25 MCG/ACT AEPB, Inhale 1 puff by mouth once daily, Disp: 60 each, Rfl: 5   Fluticasone-Umeclidin-Vilant  (TRELEGY ELLIPTA) 200-62.5-25 MCG/ACT AEPB, Inhale 1 puff into the lungs daily., Disp: 1 each, Rfl: 0   levocetirizine (XYZAL) 5 MG tablet, Take 1 tablet (5 mg total) by mouth every evening., Disp: 30 tablet, Rfl: 5   metFORMIN (GLUCOPHAGE) 850 MG tablet, Take 850 mg by mouth 2 (two) times daily with a meal., Disp: , Rfl:    Misc Natural Products (SAMBUCUS ELDERBERRY IMMUNE) SYRP, Take 5 mLs by mouth daily., Disp: , Rfl:    montelukast (SINGULAIR) 10 MG tablet, Take 10 mg by mouth at bedtime., Disp: , Rfl:    Multiple Vitamins-Minerals (AIRBORNE PO), Take 1 tablet by mouth daily., Disp: , Rfl:    Multiple Vitamins-Minerals (CENTRUM SILVER ULTRA WOMENS) TABS, Take 1 tablet by mouth daily., Disp: , Rfl:    OXYGEN, Inhale 1 L into the lungs continuous., Disp: , Rfl:    Potassium 99 MG TABS, Take 99 mg by mouth daily., Disp: , Rfl:    propranolol (INDERAL) 10 MG tablet, Take 10 mg by mouth 2 (two) times daily., Disp: , Rfl:    Sodium Chloride-Sodium Bicarb (AYR SALINE NASAL RINSE NA), Place 1 spray into the nose daily as needed (sinus rinse)., Disp: , Rfl:    triamcinolone (KENALOG) 0.025 % cream, Apply 1 application topically 2 (two) times daily as needed (rash)., Disp: , Rfl:    VENTOLIN HFA 108 (90 Base) MCG/ACT inhaler, Inhale 2 puffs into the lungs every 6 (six) hours as needed for wheezing or shortness of  breath., Disp: , Rfl:    vitamin C (ASCORBIC ACID) 500 MG tablet, Take 500 mg by mouth daily., Disp: , Rfl:   Past Medical History: Past Medical History:  Diagnosis Date   Asthma    COPD (chronic obstructive pulmonary disease) ([place])    Diabetes mellitus without complication ([place])    Emphysema of lung ([place])    GERD (gastroesophageal reflux disease)    Hypercholesteremia 2011   Hypertension    Lichen sclerosus et atrophicus    vulva   Oxygen deficiency    Palpitation    on Inderal    Tobacco Use: Social History   Tobacco Use  Smoking Status Former   Packs/day: 2.00   Years:  34.00   Total pack years: 68.00   Types: Cigarettes   Start date: 78   Quit date: 06/1996   Years since quitting: 25.6  Smokeless Tobacco Never    Labs: Review Flowsheet       Latest Ref Rng & Units 11/09/2020 11/11/2020 08/06/2021  Labs for ITP Cardiac and Pulmonary Rehab  Hemoglobin A1c 4.8 - 5.6 % 6.2  6.2  5.8   PH, Arterial 7.350 - 7.450 7.268  7.251  - -  PCO2 arterial 32.0 - 48.0 mmHg 65.3  76.6  - -  Bicarbonate 20.0 - 28.0 mmol/L 28.9  32.5  - -  O2 Saturation % 93.4  90.9  - -    Capillary Blood Glucose: Lab Results  Component Value Date   GLUCAP 103 (H) 02/03/2022   GLUCAP 93 02/03/2022   GLUCAP 98 02/01/2022   GLUCAP 107 (H) 02/01/2022   GLUCAP 182 (H) 08/08/2021     Pulmonary Assessment Scores:  Pulmonary Assessment Scores     Row Name 01/24/22 1106         ADL UCSD   ADL Phase Entry     SOB Score total 33       CAT Score   CAT Score 13       mMRC Score   mMRC Score 1             UCSD: Self-administered rating of dyspnea associated with activities of daily living (ADLs) 6-point scale (0 = "not at all" to 5 = "maximal or unable to do because of breathlessness")  Scoring Scores range from 0 to 120.  Minimally important difference is 5 units  CAT: CAT can identify the health impairment of COPD patients and is better correlated with disease progression.  CAT has a scoring range of zero to 40. The CAT score is classified into four groups of low (less than 10), medium (10 - 20), high (21-30) and very high (31-40) based on the impact level of disease on health status. A CAT score over 10 suggests significant symptoms.  A worsening CAT score could be explained by an exacerbation, poor medication adherence, poor inhaler technique, or progression of COPD or comorbid conditions.  CAT MCID is 2 points  mMRC: mMRC (Modified Medical Research Council) Dyspnea Scale is used to assess the degree of baseline functional disability in patients of respiratory  disease due to dyspnea. No minimal important difference is established. A decrease in score of 1 point or greater is considered a positive change.   Pulmonary Function Assessment:  Pulmonary Function Assessment - 01/24/22 1048       Breath   Bilateral Breath Sounds Clear;Decreased    Shortness of Breath Fear of Shortness of Breath;Limiting activity;Panic with Shortness of Breath;Yes  Exercise Target Goals: Exercise Program Goal: Individual exercise prescription set using results from initial 6 min walk test and THRR while considering  patient's activity barriers and safety.   Exercise Prescription Goal: Initial exercise prescription builds to 30-45 minutes a day of aerobic activity, 2-3 days per week.  Home exercise guidelines will be given to patient during program as part of exercise prescription that the participant will acknowledge.  Activity Barriers & Risk Stratification:  Activity Barriers & Cardiac Risk Stratification - 01/24/22 1049       Activity Barriers & Cardiac Risk Stratification   Activity Barriers Arthritis;Deconditioning;Muscular Weakness;Shortness of Breath;History of Falls    Cardiac Risk Stratification Low             6 Minute Walk:  6 Minute Walk     Row Name 01/24/22 1217         6 Minute Walk   Phase Initial     Distance 1032 feet     Walk Time 6 minutes     # of Rest Breaks 0     MPH 1.95     METS 2.28     RPE 13     Perceived Dyspnea  3     VO2 Peak 7.96     Symptoms No     Resting HR 85 bpm     Resting BP 130/72     Resting Oxygen Saturation  96 %     Exercise Oxygen Saturation  during 6 min walk 89 %     Max Ex. HR 114 bpm     Max Ex. BP 150/86     2 Minute Post BP 134/70       Interval HR   1 Minute HR 101     2 Minute HR 109     3 Minute HR 112     4 Minute HR 114     5 Minute HR 112     6 Minute HR 112     2 Minute Post HR 84     Interval Heart Rate? Yes       Interval Oxygen   Interval Oxygen? Yes      Baseline Oxygen Saturation % 96 %     1 Minute Oxygen Saturation % 96 %     1 Minute Liters of Oxygen 2 L     2 Minute Oxygen Saturation % 92 %     2 Minute Liters of Oxygen 2 L     3 Minute Oxygen Saturation % 91 %     3 Minute Liters of Oxygen 2 L     4 Minute Oxygen Saturation % 90 %     4 Minute Liters of Oxygen 2 L     5 Minute Oxygen Saturation % 90 %     5 Minute Liters of Oxygen 2 L     6 Minute Oxygen Saturation % 89 %     6 Minute Liters of Oxygen 2 L     2 Minute Post Oxygen Saturation % 97 %     2 Minute Post Liters of Oxygen 2 L              Oxygen Initial Assessment:  Oxygen Initial Assessment - 01/24/22 1047       Home Oxygen   Home Oxygen Device E-Tanks;Home Concentrator    Sleep Oxygen Prescription Continuous    Liters per minute 2    Home Exercise Oxygen Prescription Continuous    Liters  per minute 2    Home Resting Oxygen Prescription Continuous    Liters per minute 2    Compliance with Home Oxygen Use Yes      Initial 6 min Walk   Oxygen Used Continuous    Liters per minute 2      Program Oxygen Prescription   Program Oxygen Prescription Continuous    Liters per minute 2      Intervention   Short Term Goals To learn and exhibit compliance with exercise, home and travel O2 prescription;To learn and understand importance of monitoring SPO2 with pulse oximeter and demonstrate accurate use of the pulse oximeter.;To learn and understand importance of maintaining oxygen saturations>88%;To learn and demonstrate proper pursed lip breathing techniques or other breathing techniques. ;To learn and demonstrate proper use of respiratory medications    Long  Term Goals Exhibits compliance with exercise, home  and travel O2 prescription;Verbalizes importance of monitoring SPO2 with pulse oximeter and return demonstration;Maintenance of O2 saturations>88%;Exhibits proper breathing techniques, such as pursed lip breathing or other method taught during program  session;Compliance with respiratory medication;Demonstrates proper use of MDI's             Oxygen Re-Evaluation:  Oxygen Re-Evaluation     Row Name 02/09/22 1017             Program Oxygen Prescription   Program Oxygen Prescription Continuous       Liters per minute 2         Home Oxygen   Home Oxygen Device E-Tanks;Home Concentrator       Sleep Oxygen Prescription Continuous       Liters per minute 2       Home Exercise Oxygen Prescription Continuous       Liters per minute 2       Home Resting Oxygen Prescription Continuous       Liters per minute 2       Compliance with Home Oxygen Use Yes         Goals/Expected Outcomes   Short Term Goals To learn and exhibit compliance with exercise, home and travel O2 prescription;To learn and understand importance of monitoring SPO2 with pulse oximeter and demonstrate accurate use of the pulse oximeter.;To learn and understand importance of maintaining oxygen saturations>88%;To learn and demonstrate proper pursed lip breathing techniques or other breathing techniques. ;To learn and demonstrate proper use of respiratory medications       Long  Term Goals Exhibits compliance with exercise, home  and travel O2 prescription;Verbalizes importance of monitoring SPO2 with pulse oximeter and return demonstration;Maintenance of O2 saturations>88%;Exhibits proper breathing techniques, such as pursed lip breathing or other method taught during program session;Compliance with respiratory medication;Demonstrates proper use of MDI's       Goals/Expected Outcomes Compliance and unerstanding of oxygen saturation monitoring and breathing techniques to decrease shortness of breath.                Oxygen Discharge (Final Oxygen Re-Evaluation):  Oxygen Re-Evaluation - 02/09/22 1017       Program Oxygen Prescription   Program Oxygen Prescription Continuous    Liters per minute 2      Home Oxygen   Home Oxygen Device E-Tanks;Home Concentrator     Sleep Oxygen Prescription Continuous    Liters per minute 2    Home Exercise Oxygen Prescription Continuous    Liters per minute 2    Home Resting Oxygen Prescription Continuous    Liters per minute 2  Compliance with Home Oxygen Use Yes      Goals/Expected Outcomes   Short Term Goals To learn and exhibit compliance with exercise, home and travel O2 prescription;To learn and understand importance of monitoring SPO2 with pulse oximeter and demonstrate accurate use of the pulse oximeter.;To learn and understand importance of maintaining oxygen saturations>88%;To learn and demonstrate proper pursed lip breathing techniques or other breathing techniques. ;To learn and demonstrate proper use of respiratory medications    Long  Term Goals Exhibits compliance with exercise, home  and travel O2 prescription;Verbalizes importance of monitoring SPO2 with pulse oximeter and return demonstration;Maintenance of O2 saturations>88%;Exhibits proper breathing techniques, such as pursed lip breathing or other method taught during program session;Compliance with respiratory medication;Demonstrates proper use of MDI's    Goals/Expected Outcomes Compliance and unerstanding of oxygen saturation monitoring and breathing techniques to decrease shortness of breath.             Initial Exercise Prescription:  Initial Exercise Prescription - 01/24/22 1200       Date of Initial Exercise RX and Referring Provider   Date 01/24/22    Referring Provider [name]    Expected Discharge Date 03/31/22      Oxygen   Oxygen Continuous    Liters 2    Maintain Oxygen Saturation 88% or higher      Recumbant Bike   Level --    Watts --    Minutes --      NuStep   Level 1    SPM 60    Minutes 15      Arm Ergometer   Level 1    Watts 5    Minutes 15      Prescription Details   Frequency (times per week) 2    Duration Progress to 30 minutes of continuous aerobic without signs/symptoms of physical distress       Intensity   THRR 40-80% of Max Heartrate 61-122    Ratings of Perceived Exertion 11-13    Perceived Dyspnea 0-4      Progression   Progression Continue to progress workloads to maintain intensity without signs/symptoms of physical distress.      Resistance Training   Training Prescription Yes    Weight red bands    Reps 10-15             Perform Capillary Blood Glucose checks as needed.  Exercise Prescription Changes:   Exercise Prescription Changes     Row Name 02/01/22 1500 02/15/22 1500           Response to Exercise   Blood Pressure (Admit) 132/76 160/82      Blood Pressure (Exercise) 126/70 180/90      Blood Pressure (Exit) 130/76 164/72      Heart Rate (Admit) 70 bpm 89 bpm      Heart Rate (Exercise) 96 bpm 106 bpm      Heart Rate (Exit) 87 bpm 97 bpm      Oxygen Saturation (Admit) 97 % 96 %      Oxygen Saturation (Exercise) 96 % 93 %      Oxygen Saturation (Exit) 93 % 96 %      Rating of Perceived Exertion (Exercise) 13 13      Perceived Dyspnea (Exercise) 1 1      Duration Progress to 30 minutes of  aerobic without signs/symptoms of physical distress Continue with 30 min of aerobic exercise without signs/symptoms of physical distress.      Intensity THRR  unchanged THRR unchanged        Resistance Training   Training Prescription Yes Yes      Weight red bands red bands      Reps 10-15 10-15      Time 10 Minutes 10 Minutes        Oxygen   Oxygen Continuous Continuous      Liters 2 2        NuStep   Level 1 4      SPM 60 80      Minutes 15 15      METs 1.6 2        Arm Ergometer   Level 1 4      Minutes 15 15      METs 1.6 2.8        Oxygen   Maintain Oxygen Saturation 88% or higher --               Exercise Comments:   Exercise Goals and Review:   Exercise Goals     Row Name 01/24/22 1049 02/09/22 1008           Exercise Goals   Increase Physical Activity Yes Yes      Intervention Provide advice, education, support and  counseling about physical activity/exercise needs.;Develop an individualized exercise prescription for aerobic and resistive training based on initial evaluation findings, risk stratification, comorbidities and participant's personal goals. Provide advice, education, support and counseling about physical activity/exercise needs.;Develop an individualized exercise prescription for aerobic and resistive training based on initial evaluation findings, risk stratification, comorbidities and participant's personal goals.      Expected Outcomes Short Term: Attend rehab on a regular basis to increase amount of physical activity.;Long Term: Add in home exercise to make exercise part of routine and to increase amount of physical activity.;Long Term: Exercising regularly at least 3-5 days a week. Short Term: Attend rehab on a regular basis to increase amount of physical activity.;Long Term: Add in home exercise to make exercise part of routine and to increase amount of physical activity.;Long Term: Exercising regularly at least 3-5 days a week.      Increase Strength and Stamina Yes Yes      Intervention Provide advice, education, support and counseling about physical activity/exercise needs.;Develop an individualized exercise prescription for aerobic and resistive training based on initial evaluation findings, risk stratification, comorbidities and participant's personal goals. Provide advice, education, support and counseling about physical activity/exercise needs.;Develop an individualized exercise prescription for aerobic and resistive training based on initial evaluation findings, risk stratification, comorbidities and participant's personal goals.      Expected Outcomes Short Term: Perform resistance training exercises routinely during rehab and add in resistance training at home;Short Term: Increase workloads from initial exercise prescription for resistance, speed, and METs.;Long Term: Improve cardiorespiratory  fitness, muscular endurance and strength as measured by increased METs and functional capacity (6MWT) Short Term: Perform resistance training exercises routinely during rehab and add in resistance training at home;Short Term: Increase workloads from initial exercise prescription for resistance, speed, and METs.;Long Term: Improve cardiorespiratory fitness, muscular endurance and strength as measured by increased METs and functional capacity (6MWT)      Able to understand and use rate of perceived exertion (RPE) scale Yes Yes      Intervention Provide education and explanation on how to use RPE scale Provide education and explanation on how to use RPE scale      Expected Outcomes Short Term: Able to use RPE daily in  rehab to express subjective intensity level;Long Term:  Able to use RPE to guide intensity level when exercising independently Short Term: Able to use RPE daily in rehab to express subjective intensity level;Long Term:  Able to use RPE to guide intensity level when exercising independently      Able to understand and use Dyspnea scale Yes Yes      Intervention Provide education and explanation on how to use Dyspnea scale Provide education and explanation on how to use Dyspnea scale      Expected Outcomes Short Term: Able to use Dyspnea scale daily in rehab to express subjective sense of shortness of breath during exertion;Long Term: Able to use Dyspnea scale to guide intensity level when exercising independently Short Term: Able to use Dyspnea scale daily in rehab to express subjective sense of shortness of breath during exertion;Long Term: Able to use Dyspnea scale to guide intensity level when exercising independently      Knowledge and understanding of Target Heart Rate Range (THRR) Yes Yes      Intervention Provide education and explanation of THRR including how the numbers were predicted and where they are located for reference Provide education and explanation of THRR including how the  numbers were predicted and where they are located for reference      Expected Outcomes Short Term: Able to state/look up THRR;Long Term: Able to use THRR to govern intensity when exercising independently;Short Term: Able to use daily as guideline for intensity in rehab Short Term: Able to state/look up THRR;Long Term: Able to use THRR to govern intensity when exercising independently;Short Term: Able to use daily as guideline for intensity in rehab      Understanding of Exercise Prescription Yes Yes      Intervention Provide education, explanation, and written materials on patient's individual exercise prescription Provide education, explanation, and written materials on patient's individual exercise prescription      Expected Outcomes Short Term: Able to explain program exercise prescription;Long Term: Able to explain home exercise prescription to exercise independently Short Term: Able to explain program exercise prescription;Long Term: Able to explain home exercise prescription to exercise independently               Exercise Goals Re-Evaluation :  Exercise Goals Re-Evaluation     Row Name 02/09/22 1013             Exercise Goal Re-Evaluation   Exercise Goals Review Increase Physical Activity;Increase Strength and Stamina;Able to understand and use rate of perceived exertion (RPE) scale;Able to understand and use Dyspnea scale;Knowledge and understanding of Target Heart Rate Range (THRR);Understanding of Exercise Prescription       Comments Dorethia has completed 3 exercise sessions Jayleah exercises for 15 min on arm ergometer at level 2, 2.2 METs. She then exercises on the nustep at level 4 for 15 min, 1.8 METs. Primrose performed the warmup and cooldown standing without limitations. She is motivated to exercise. It is too soon to note and discernable progressions. Will continue to monitor and progress as able.       Expected Outcomes Through exercise at rehab and home, the patient will  decrease shortness of breath with daily activities and feel confident in carrying out an exercise regimen at home.                Discharge Exercise Prescription (Final Exercise Prescription Changes):  Exercise Prescription Changes - 02/15/22 1500       Response to Exercise   Blood Pressure (Admit)  160/82    Blood Pressure (Exercise) 180/90    Blood Pressure (Exit) 164/72    Heart Rate (Admit) 89 bpm    Heart Rate (Exercise) 106 bpm    Heart Rate (Exit) 97 bpm    Oxygen Saturation (Admit) 96 %    Oxygen Saturation (Exercise) 93 %    Oxygen Saturation (Exit) 96 %    Rating of Perceived Exertion (Exercise) 13    Perceived Dyspnea (Exercise) 1    Duration Continue with 30 min of aerobic exercise without signs/symptoms of physical distress.    Intensity THRR unchanged      Resistance Training   Training Prescription Yes    Weight red bands    Reps 10-15    Time 10 Minutes      Oxygen   Oxygen Continuous    Liters 2      NuStep   Level 4    SPM 80    Minutes 15    METs 2      Arm Ergometer   Level 4    Minutes 15    METs 2.8             Nutrition:  Target Goals: Understanding of nutrition guidelines, daily intake of sodium <1554m, cholesterol <2038m calories 30% from fat and 7% or less from saturated fats, daily to have 5 or more servings of fruits and vegetables.  Biometrics:    Nutrition Therapy Plan and Nutrition Goals:  Nutrition Therapy & Goals - 02/01/22 1545       Nutrition Therapy   Diet Heart healthy/carbohydrate consistent    Drug/Food Interactions Statins/Certain Fruits      Personal Nutrition Goals   Nutrition Goal Patient to choose a daily variety of fruits, vegetables, whole grains, lean protein/plant protein, nonfat dairy as part of heart healthy lifestyle    Personal Goal #2 Patient to limit to <150053mf sodium daily    Personal Goal #3 Patient to identify strategies for blood sugar control.    Comments Discussed high protein/high  fiber snacks and the plate method as a guide for meal planning to support blood sugar control.      Intervention Plan   Intervention Prescribe, educate and counsel regarding individualized specific dietary modifications aiming towards targeted core components such as weight, hypertension, lipid management, diabetes, heart failure and other comorbidities.;Nutrition handout(s) given to patient.    Expected Outcomes Short Term Goal: Understand basic principles of dietary content, such as calories, fat, sodium, cholesterol and nutrients.;Long Term Goal: Adherence to prescribed nutrition plan.             Nutrition Assessments:  MEDIFICTS Score Key: ?70 Need to make dietary changes  40-70 Heart Healthy Diet ? 40 Therapeutic Level Cholesterol Diet   Picture Your Plate Scores: <40<99healthy dietary pattern with much room for improvement. 41-50 Dietary pattern unlikely to meet recommendations for good health and room for improvement. 51-60 More healthful dietary pattern, with some room for improvement.  >60 Healthy dietary pattern, although there may be some specific behaviors that could be improved.    Nutrition Goals Re-Evaluation:  Nutrition Goals Re-Evaluation     [place] 02/01/22 1545             Goals   Current Weight 197 lb 12 oz (89.7 kg)       Comment A1c 5.8       Expected Outcome Discussed high protein/high fiber snacks and the plate method as a guide for meal planning to  support blood sugar control. Stressed the importance of eating prior to exercise to support blood sugar.                Nutrition Goals Discharge (Final Nutrition Goals Re-Evaluation):  Nutrition Goals Re-Evaluation - 02/01/22 1545       Goals   Current Weight 197 lb 12 oz (89.7 kg)    Comment A1c 5.8    Expected Outcome Discussed high protein/high fiber snacks and the plate method as a guide for meal planning to support blood sugar control. Stressed the importance of eating prior to  exercise to support blood sugar.             Psychosocial: Target Goals: Acknowledge presence or absence of significant depression and/or stress, maximize coping skills, provide positive support system. Participant is able to verbalize types and ability to use techniques and skills needed for reducing stress and depression.  Initial Review & Psychosocial Screening:  Initial Psych Review & Screening - 01/24/22 1046       Initial Review   Current issues with None Identified      Family Dynamics   Good Support System? Yes    Comments family      Barriers   Psychosocial barriers to participate in program There are no identifiable barriers or psychosocial needs.      Screening Interventions   Interventions Encouraged to exercise             Quality of Life Scores:  Scores of 19 and below usually indicate a poorer quality of life in these areas.  A difference of  2-3 points is a clinically meaningful difference.  A difference of 2-3 points in the total score of the Quality of Life Index has been associated with significant improvement in overall quality of life, self-image, physical symptoms, and general health in studies assessing change in quality of life.  PHQ-9: Review Flowsheet       01/24/2022 07/23/2021  Depression screen PHQ 2/9  Decreased Interest 0 1  Down, Depressed, Hopeless 0 1  PHQ - 2 Score 0 2  Altered sleeping 0 1  Tired, decreased energy 0 1  Change in appetite 0 0  Feeling bad or failure about yourself  0 0  Trouble concentrating 0 0  Moving slowly or fidgety/restless 0 0  Suicidal thoughts 0 0  PHQ-9 Score 0 4  Difficult doing work/chores - Somewhat difficult   Interpretation of Total Score  Total Score Depression Severity:  1-4 = Minimal depression, 5-9 = Mild depression, 10-14 = Moderate depression, 15-19 = Moderately severe depression, 20-27 = Severe depression   Psychosocial Evaluation and Intervention:  Psychosocial Evaluation - 01/24/22  1046       Psychosocial Evaluation & Interventions   Interventions Encouraged to exercise with the program and follow exercise prescription    Expected Outcomes For Erikka to participate in Pulmonary Rehab    Continue Psychosocial Services  Follow up required by staff             Psychosocial Re-Evaluation:  Psychosocial Re-Evaluation     Stephen Name 02/08/22 [phone]             Psychosocial Re-Evaluation   Current issues with None Identified       Comments No change in psychosocial status since orientation to pulmonary rehab 01/24/2022.  Her husband drives her to class and is very supportive, she has no signs of depression and seems to enjoy exercising in the program.  No barriers  or psychosocial concerns are identified at this time.       Expected Outcomes For Rebakah to continue to be without barriers or psychosocial concerns while participating in pulmonary rehab.       Interventions Encouraged to attend Pulmonary Rehabilitation for the exercise       Continue Psychosocial Services  No Follow up required                Psychosocial Discharge (Final Psychosocial Re-Evaluation):  Psychosocial Re-Evaluation - 02/08/22 0942       Psychosocial Re-Evaluation   Current issues with None Identified    Comments No change in psychosocial status since orientation to pulmonary rehab 01/24/2022.  Her husband drives her to class and is very supportive, she has no signs of depression and seems to enjoy exercising in the program.  No barriers or psychosocial concerns are identified at this time.    Expected Outcomes For Woodie to continue to be without barriers or psychosocial concerns while participating in pulmonary rehab.    Interventions Encouraged to attend Pulmonary Rehabilitation for the exercise    Continue Psychosocial Services  No Follow up required             Education: Education Goals: Education classes will be provided on a weekly basis, covering required topics.  Participant will state understanding/return demonstration of topics presented.  Learning Barriers/Preferences:  Learning Barriers/Preferences - 01/24/22 1046       Learning Barriers/Preferences   Learning Barriers None    Learning Preferences Individual Instruction;Computer/Internet;Pictoral;Written Material;Video;Verbal Instruction             Education Topics: Introduction to Pulmonary Rehab Group instruction provided by PowerPoint, verbal discussion, and written material to support subject matter. Instructor reviews what Pulmonary Rehab is, the purpose of the program, and how patients are referred.     Know Your Numbers Group instruction that is supported by a PowerPoint presentation. Instructor discusses importance of knowing and understanding resting, exercise, and post-exercise oxygen saturation, heart rate, and blood pressure. Oxygen saturation, heart rate, blood pressure, rating of perceived exertion, and dyspnea are reviewed along with a normal range for these values.    Exercise for the Pulmonary Patient Group instruction that is supported by a PowerPoint presentation. Instructor discusses benefits of exercise, core components of exercise, frequency, duration, and intensity of an exercise routine, importance of utilizing pulse oximetry during exercise, safety while exercising, and options of places to exercise outside of rehab.       MET Level  Group instruction provided by PowerPoint, verbal discussion, and written material to support subject matter. Instructor reviews what METs are and how to increase METs.    Pulmonary Medications Verbally interactive group education provided by instructor with focus on inhaled medications and proper administration. Flowsheet Row PULMONARY REHAB CHRONIC OBSTRUCTIVE PULMONARY DISEASE from 02/10/2022 in [place]  Date 02/03/22  Educator Donnetta Simpers  Instruction Review Code 2- Demonstrated Understanding        Anatomy and Physiology of the Respiratory System Group instruction provided by PowerPoint, verbal discussion, and written material to support subject matter. Instructor reviews respiratory cycle and anatomical components of the respiratory system and their functions. Instructor also reviews differences in obstructive and restrictive respiratory diseases with examples of each.  Flowsheet Row PULMONARY REHAB CHRONIC OBSTRUCTIVE PULMONARY DISEASE from 02/10/2022 in [place]  Date 02/10/22  Educator Donnetta Simpers  [Handout]  Instruction Review Code 1- Verbalizes Understanding       Oxygen  Safety Group instruction provided by PowerPoint, verbal discussion, and written material to support subject matter. There is an overview of "What is Oxygen" and "Why do we need it".  Instructor also reviews how to create a safe environment for oxygen use, the importance of using oxygen as prescribed, and the risks of noncompliance. There is a brief discussion on traveling with oxygen and resources the patient may utilize.   Oxygen Use Group instruction provided by PowerPoint, verbal discussion, and written material to discuss how supplemental oxygen is prescribed and different types of oxygen supply systems. Resources for more information are provided.    Breathing Techniques Group instruction that is supported by demonstration and informational handouts. Instructor discusses the benefits of pursed lip and diaphragmatic breathing and detailed demonstration on how to perform both.     Risk Factor Reduction Group instruction that is supported by a PowerPoint presentation. Instructor discusses the definition of a risk factor, different risk factors for pulmonary disease, and how the heart and lungs work together.   MD Day A group question and answer session with a medical doctor that allows participants to ask questions that relate to their pulmonary disease  state.   Nutrition for the Pulmonary Patient Group instruction provided by PowerPoint slides, verbal discussion, and written materials to support subject matter. The instructor gives an explanation and review of healthy diet recommendations, which includes a discussion on weight management, recommendations for fruit and vegetable consumption, as well as protein, fluid, caffeine, fiber, sodium, sugar, and alcohol. Tips for eating when patients are short of breath are discussed.    Other Education Group or individual verbal, written, or video instructions that support the educational goals of the pulmonary rehab program. Flowsheet Row PULMONARY REHAB CHRONIC OBSTRUCTIVE PULMONARY DISEASE from 02/10/2022 in [place]  Date 07/29/21  Educator [employer] H/O  Instruction Review Code 1- Verbalizes Understanding        Knowledge Questionnaire Score:   Core Components/Risk Factors/Patient Goals at Admission:  Personal Goals and Risk Factors at Admission - 01/24/22 1047       Core Components/Risk Factors/Patient Goals on Admission    Weight Management --    Intervention --    Expected Outcomes --    Improve shortness of breath with ADL's Yes    Intervention Provide education, individualized exercise plan and daily activity instruction to help decrease symptoms of SOB with activities of daily living.    Expected Outcomes Short Term: Improve cardiorespiratory fitness to achieve a reduction of symptoms when performing ADLs;Long Term: Be able to perform more ADLs without symptoms or delay the onset of symptoms    Increase knowledge of respiratory medications and ability to use respiratory devices properly  Yes    Intervention Provide education and demonstration as needed of appropriate use of medications, inhalers, and oxygen therapy.    Expected Outcomes Short Term: Achieves understanding of medications use. Understands that oxygen is a medication prescribed by  physician. Demonstrates appropriate use of inhaler and oxygen therapy.;Long Term: Maintain appropriate use of medications, inhalers, and oxygen therapy.             Core Components/Risk Factors/Patient Goals Review:   Goals and Risk Factor Review     Row Name 02/08/22 0947             Core Components/Risk Factors/Patient Goals Review   Personal Goals Review Weight Management/Obesity;Increase knowledge of respiratory medications and ability to use respiratory devices properly.;Improve shortness of breath with ADL's;Develop more efficient  breathing techniques such as purse lipped breathing and diaphragmatic breathing and practicing self-pacing with activity.       Review Ammie has finally started pulmonary rehab and has attended 2 exercise sessions.  It is too early to see progression of weight loss and program goals.  Her CBG's has been WNL with exercise as she takes metformin.  Should see improvement in goals in the next full 30 days.       Expected Outcomes See admission goals.                Core Components/Risk Factors/Patient Goals at Discharge (Final Review):   Goals and Risk Factor Review - 02/08/22 0947       Core Components/Risk Factors/Patient Goals Review   Personal Goals Review Weight Management/Obesity;Increase knowledge of respiratory medications and ability to use respiratory devices properly.;Improve shortness of breath with ADL's;Develop more efficient breathing techniques such as purse lipped breathing and diaphragmatic breathing and practicing self-pacing with activity.    Review Summers has finally started pulmonary rehab and has attended 2 exercise sessions.  It is too early to see progression of weight loss and program goals.  Her CBG's has been WNL with exercise as she takes metformin.  Should see improvement in goals in the next full 30 days.    Expected Outcomes See admission goals.             ITP Comments: Pt is making expected progress toward  Pulmonary Rehab goals after completing 5 sessions. Recommend continued exercise, life style modification, education, and utilization of breathing techniques to increase stamina and strength, while also decreasing shortness of breath with exertion.  Dr. Rodman Pickle is Medical Director for Pulmonary Rehab at [facility].

## 2022-02-17 ENCOUNTER — Encounter (HOSPITAL_COMMUNITY)
Admission: RE | Admit: 2022-02-17 | Discharge: 2022-02-17 | Disposition: A | Discharge status: Home / Self Care | Payer: Medicare Other | Source: Ambulatory Visit | Attending: Internal Medicine | Admitting: Internal Medicine

## 2022-02-17 DIAGNOSIS — J449 Chronic obstructive pulmonary disease, unspecified: Secondary | ICD-10-CM | POA: Diagnosis not present

## 2022-02-17 NOTE — Progress Notes (Signed)
Daily Session Note  Patient Details  Name: Amanda Garrett MRN: [id number] Date of Birth: [birth year] Referring Provider:   [name] Pulmonary Rehab Walk Test from 01/24/2022 in [place]  Referring Provider [name]       Encounter Date: 02/17/2022  Check In:  Session Check In - 02/17/22 1513       Check-In   Supervising physician immediately available to respond to emergencies [facility] immediately available    Physician(s) Cyndia Skeeters    Location [facility]    Staff Present Rosebud Poles, RN, BSN;Randi Olen Cordial BS, ACSM-CEP, Exercise Physiologist;Royann Wildasin Rosana Hoes, MS, ACSM-CEP, Exercise Physiologist;Lisa Ysidro Evert, RN    Virtual Visit No    Medication changes reported     No    Fall or balance concerns reported    No    Tobacco Cessation No Change    Warm-up and Cool-down Performed as group-led instruction    Resistance Training Performed Yes    VAD Patient? No    PAD/SET Patient? No      Pain Assessment   Currently in Pain? No/denies    Multiple Pain Sites No             Capillary Blood Glucose: No results found for this or any previous visit (from the past 24 hour(s)).    Social History   Tobacco Use  Smoking Status Former   Packs/day: 2.00   Years: 34.00   Total pack years: 68.00   Types: Cigarettes   Start date: 1965   Quit date: 06/1996   Years since quitting: 25.6  Smokeless Tobacco Never    Goals Met:  Proper associated with RPD/PD & O2 Sat Exercise tolerated well No report of concerns or symptoms today Strength training completed today  Goals Unmet:  Not Applicable  Comments: Service time is from 1308 to 1440.    Dr. Rodman Pickle is Medical Director for Pulmonary Rehab at [facility].

## 2022-02-21 ENCOUNTER — Telehealth: Payer: Self-pay | Admitting: Internal Medicine

## 2022-02-21 NOTE — Telephone Encounter (Signed)
Patients daughter is calling to find out if the CT scan that is ordered to be done this month is medically necessary?  Please advise

## 2022-02-21 NOTE — Telephone Encounter (Signed)
Patient's daughter Tandy Gaw called and wanted to confirm that the CT scan that is scheduled 9/13 is medically necessary.   Please call daughter back at [phone].

## 2022-02-21 NOTE — Telephone Encounter (Unsigned)
She was supposed to have a CT Chest in 6-12 months to follow up on a lung nodule. I think I had recommended March 2024 for the scan.

## 2022-02-22 ENCOUNTER — Encounter (HOSPITAL_COMMUNITY)
Admission: RE | Admit: 2022-02-22 | Discharge: 2022-02-22 | Disposition: A | Discharge status: Home / Self Care | Payer: Medicare Other | Source: Ambulatory Visit | Attending: Internal Medicine | Admitting: Internal Medicine

## 2022-02-22 DIAGNOSIS — J449 Chronic obstructive pulmonary disease, unspecified: Secondary | ICD-10-CM

## 2022-02-22 NOTE — Progress Notes (Signed)
Home Exercise Prescription I have reviewed a Home Exercise Prescription with Amanda Garrett. Pt is currently not exercising at home. Discussed pt starting to walk outside or on her treadmill, building up to 30 minutes. Pt has a track near her house. Encouraged adding 1-2 nonrehab days. Pt agreeable.The patient stated that their goals were to keep moving and maintaining activity at home. We reviewed exercise guidelines, target heart rate during exercise, RPE Scale, weather conditions, endpoints for exercise, warmup and cool down. The patient is encouraged to come to me with any questions. I will continue to follow up with the patient to assist them with progression and safety.    Amanda Garrett [place], [place], ACSM-CEP 02/22/2022 3:28 PM

## 2022-02-22 NOTE — Progress Notes (Signed)
Daily Session Note  Patient Details  Name: Amanda Garrett MRN: [id number] Date of Birth: 04-26-1956 Referring Provider:   [name] Pulmonary Rehab Walk Test from 01/24/2022 in [place]  Referring Provider [name]       Encounter Date: 02/22/2022  Check In:  Session Check In - 02/22/22 1507       Check-In   Supervising physician immediately available to respond to emergencies [facility] immediately available    Physician(s) Dr. Earney [place]    [facility]    Staff Present Rosebud Poles, RN, Quentin Ore, MS, ACSM-CEP, Exercise Physiologist;Randi Yevonne Pax, ACSM-CEP, Exercise Physiologist;Johnny Starleen Blue, MS, Exercise Physiologist    Virtual Visit No    Medication changes reported     No    Fall or balance concerns reported    No    Tobacco Cessation No Change    Warm-up and Cool-down Performed as group-led instruction    Resistance Training Performed Yes    VAD Patient? No    PAD/SET Patient? No      Pain Assessment   Currently in Pain? No/denies    Multiple Pain Sites No             Capillary Blood Glucose: No results found for this or any previous visit (from the past 24 hour(s)).    Social History   Tobacco Use  Smoking Status Former   Packs/day: 2.00   Years: 34.00   Total pack years: 68.00   Types: Cigarettes   Start date: 1965   Quit date: 06/1996   Years since quitting: 25.7  Smokeless Tobacco Never    Goals Met:  Proper associated with RPD/PD & O2 Sat [place] with exercise equipment Exercise tolerated well No report of concerns or symptoms today Strength training completed today  Goals Unmet:  Not Applicable  Comments: Service time is from 1305 to 1433.    Dr. Rodman Pickle is Medical Director for Pulmonary Rehab at [facility].

## 2022-02-23 ENCOUNTER — Ambulatory Visit (HOSPITAL_COMMUNITY)
Admission: RE | Admit: 2022-02-23 | Discharge: 2022-02-23 | Disposition: A | Discharge status: Home / Self Care | Payer: Medicare Other | Source: Ambulatory Visit | Attending: Nurse Practitioner | Admitting: Nurse Practitioner

## 2022-02-23 DIAGNOSIS — R911 Solitary pulmonary nodule: Secondary | ICD-10-CM | POA: Diagnosis present

## 2022-02-23 NOTE — Telephone Encounter (Signed)
ATC patient but no answer to call. No voicemail picked up. Scan is noted to be done today at 1700. Dr [name] wanted scan for next year. Nothing further needed. Will await for results to send to [place] and determine when she wants repeat scans.

## 2022-02-24 ENCOUNTER — Encounter (HOSPITAL_COMMUNITY): Payer: Medicare Other

## 2022-02-24 ENCOUNTER — Telehealth (HOSPITAL_COMMUNITY): Payer: Self-pay

## 2022-02-24 NOTE — Telephone Encounter (Signed)
Returned call from [place]. Left a message with department number.

## 2022-03-01 ENCOUNTER — Encounter (HOSPITAL_COMMUNITY): Payer: Medicare Other

## 2022-03-01 ENCOUNTER — Telehealth (HOSPITAL_COMMUNITY): Payer: Self-pay

## 2022-03-01 NOTE — Telephone Encounter (Signed)
Received call from [place] that she has COVID. Discussed staying out for this week and checking in next week. Luellen agreed.

## 2022-03-03 ENCOUNTER — Encounter (HOSPITAL_COMMUNITY): Payer: Medicare Other

## 2022-03-04 ENCOUNTER — Telehealth: Payer: Self-pay | Admitting: Nurse Practitioner

## 2022-03-04 NOTE — Telephone Encounter (Signed)
Spoke with patient. CT chest showed a 1.4.x 1.2 cm area of nodular opacities, likely infectious/inflammatory. She tested positive for COVID 3 days after her scan on 9/16. She was having acute cough and fatigue the day of her scan. She has been treated with antiviral. Breathing is stable without increased shortness of breath or increased O2 requirement. Cough is occasionally productive but slowly improving. Suspect this is likely the cause of her CT changes. Since she is recovering well, we will hold off on any further medications at this time. Advised her to notify if she develops worsening symptoms especially increased productive cough or fevers. Verbalized understanding. Plan to repeat CT chest in 3 months to ensure resolution. Previous 6 mm nodule stable.

## 2022-03-07 ENCOUNTER — Telehealth (HOSPITAL_COMMUNITY): Payer: Self-pay | Admitting: *Deleted

## 2022-03-07 NOTE — Telephone Encounter (Signed)
Pt sts she is still feeling weak but recovering from Covid. Her pulmonologist has given her permission to come back to class tomorrow but to "take it easy".  Yves Dill BS, ACSM-CEP 03/07/2022 2:41 PM

## 2022-03-08 ENCOUNTER — Encounter (HOSPITAL_COMMUNITY)
Admission: RE | Admit: 2022-03-08 | Discharge: 2022-03-08 | Disposition: A | Discharge status: Home / Self Care | Payer: Medicare Other | Source: Ambulatory Visit | Attending: Internal Medicine | Admitting: Internal Medicine

## 2022-03-08 DIAGNOSIS — J449 Chronic obstructive pulmonary disease, unspecified: Secondary | ICD-10-CM

## 2022-03-08 NOTE — Progress Notes (Signed)
Daily Session Note  Patient Details  Name: Amanda Garrett MRN: [id number] Date of Birth: 06-13-1956 Referring Provider:   [name] Pulmonary Rehab Walk Test from 01/24/2022 in [place]  Referring Provider [name]       Encounter Date: 03/08/2022  Check In:  Session Check In - 03/08/22 1441       Check-In   Supervising physician immediately available to respond to emergencies [facility] - Physician supervision    Physician(s) Dr. Ernest Mallick    [facility]    Staff Present Rodney Langton, RN;Carlette Wilber Oliphant, RN, BSN;Ramon Dredge, RN, Fernande Bras, MS, ACSM-CEP, Exercise Physiologist;Randi Yevonne Pax, ACSM-CEP, Exercise Physiologist    Virtual Visit No    Medication changes reported     No    Fall or balance concerns reported    No    Tobacco Cessation No Change    Warm-up and Cool-down Performed as group-led instruction    Resistance Training Performed Yes    VAD Patient? No    PAD/SET Patient? No      Pain Assessment   Currently in Pain? No/denies    Multiple Pain Sites No             Capillary Blood Glucose: No results found for this or any previous visit (from the past 24 hour(s)).    Social History   Tobacco Use  Smoking Status Former   Packs/day: 2.00   Years: 34.00   Total pack years: 68.00   Types: Cigarettes   Start date: 1965   Quit date: 06/1996   Years since quitting: 25.7  Smokeless Tobacco Never    Goals Met:  Proper associated with RPD/PD & O2 Sat Exercise tolerated well No report of concerns or symptoms today Strength training completed today  Goals Unmet:  Not Applicable  Comments: Service time is from 1327 to [place]    Dr. Rodman Pickle is Medical Director for Pulmonary Rehab at [facility]

## 2022-03-09 NOTE — Progress Notes (Signed)
Pulmonary Individual Treatment Plan  Patient Details  Name: AGATA LUCENTE MRN: [id number] Date of Birth: 1956/04/05 Referring Provider:   [name] Pulmonary Rehab Walk Test from 01/24/2022 in [place]  Referring Provider [name]       Initial Encounter Date:  Flowsheet Row Pulmonary Rehab Walk Test from 01/24/2022 in [place]  Date 01/24/22       Visit Diagnosis: Stage 3 severe COPD by GOLD classification ([place])  Patient's Home Medications on Admission:   Current Outpatient Medications:    albuterol (PROVENTIL) (2.5 MG/3ML) 0.083% nebulizer solution, Take 2.5 mg by nebulization every 6 (six) hours as needed for wheezing or shortness of breath., Disp: , Rfl:    atorvastatin (LIPITOR) 10 MG tablet, Take 10 mg by mouth daily., Disp: , Rfl:    BAYER LOW DOSE 81 MG EC tablet, Take 81 mg by mouth daily. Swallow whole., Disp: , Rfl:    benzonatate (TESSALON) 200 MG capsule, Take 1 capsule (200 mg total) by mouth 3 (three) times daily as needed for cough., Disp: 30 capsule, Rfl: 1   Calcium-Magnesium-Zinc (CAL-MAG-ZINC PO), Take 1 tablet by mouth daily., Disp: , Rfl:    Cholecalciferol (VITAMIN D-3) 25 MCG (1000 UT) CAPS, Take 1,000 Units by mouth daily., Disp: , Rfl:    clobetasol ointment (TEMOVATE) 0.35 %, Apply 1 application topically daily as needed (in the perineal area)., Disp: , Rfl:    Cyanocobalamin (B-12) 2500 MCG TABS, Take 2,500 mcg by mouth daily., Disp: , Rfl:    famotidine (PEPCID) 20 MG tablet, Take 1 tablet (20 mg total) by mouth at bedtime. Please schedule an office visit for further refills. Thank you, Disp: 90 tablet, Rfl: 0   fluticasone (FLONASE) 50 MCG/ACT nasal spray, Place 2 sprays into both nostrils daily., Disp: 18.2 mL, Rfl: 2   Fluticasone-Umeclidin-Vilant (TRELEGY ELLIPTA) 200-62.5-25 MCG/ACT AEPB, Inhale 1 puff by mouth once daily, Disp: 60 each, Rfl: 5   Fluticasone-Umeclidin-Vilant  (TRELEGY ELLIPTA) 200-62.5-25 MCG/ACT AEPB, Inhale 1 puff into the lungs daily., Disp: 1 each, Rfl: 0   levocetirizine (XYZAL) 5 MG tablet, Take 1 tablet (5 mg total) by mouth every evening., Disp: 30 tablet, Rfl: 5   losartan (COZAAR) 50 MG tablet, Take 50 mg by mouth daily., Disp: , Rfl:    metFORMIN (GLUCOPHAGE) 850 MG tablet, Take 850 mg by mouth 2 (two) times daily with a meal., Disp: , Rfl:    Misc Natural Products (SAMBUCUS ELDERBERRY IMMUNE) SYRP, Take 5 mLs by mouth daily., Disp: , Rfl:    montelukast (SINGULAIR) 10 MG tablet, Take 10 mg by mouth at bedtime., Disp: , Rfl:    Multiple Vitamins-Minerals (AIRBORNE PO), Take 1 tablet by mouth daily., Disp: , Rfl:    Multiple Vitamins-Minerals (CENTRUM SILVER ULTRA WOMENS) TABS, Take 1 tablet by mouth daily., Disp: , Rfl:    OXYGEN, Inhale 1 L into the lungs continuous., Disp: , Rfl:    Potassium 99 MG TABS, Take 99 mg by mouth daily., Disp: , Rfl:    propranolol (INDERAL) 10 MG tablet, Take 10 mg by mouth 2 (two) times daily., Disp: , Rfl:    Sodium Chloride-Sodium Bicarb (AYR SALINE NASAL RINSE NA), Place 1 spray into the nose daily as needed (sinus rinse)., Disp: , Rfl:    triamcinolone (KENALOG) 0.025 % cream, Apply 1 application topically 2 (two) times daily as needed (rash)., Disp: , Rfl:    VENTOLIN HFA 108 (90 Base) MCG/ACT inhaler,  Inhale 2 puffs into the lungs every 6 (six) hours as needed for wheezing or shortness of breath., Disp: , Rfl:    vitamin C (ASCORBIC ACID) 500 MG tablet, Take 500 mg by mouth daily., Disp: , Rfl:   Past Medical History: Past Medical History:  Diagnosis Date   Asthma    COPD (chronic obstructive pulmonary disease) ([place])    Diabetes mellitus without complication ([place])    Emphysema of lung ([place])    GERD (gastroesophageal reflux disease)    Hypercholesteremia 2011   Hypertension    Lichen sclerosus et atrophicus    vulva   Oxygen deficiency    Palpitation    on Inderal    Tobacco Use: Social  History   Tobacco Use  Smoking Status Former   Packs/day: 2.00   Years: 34.00   Total pack years: 68.00   Types: Cigarettes   Start date: 34   Quit date: 06/1996   Years since quitting: 25.7  Smokeless Tobacco Never    Labs: Review Flowsheet       Latest Ref Rng & Units 11/09/2020 11/11/2020 08/06/2021  Labs for ITP Cardiac and Pulmonary Rehab  Hemoglobin A1c 4.8 - 5.6 % 6.2  6.2  5.8   PH, Arterial 7.350 - 7.450 7.268  7.251  - -  PCO2 arterial 32.0 - 48.0 mmHg 65.3  76.6  - -  Bicarbonate 20.0 - 28.0 mmol/L 28.9  32.5  - -  O2 Saturation % 93.4  90.9  - -    Capillary Blood Glucose: Lab Results  Component Value Date   GLUCAP 103 (H) 02/03/2022   GLUCAP 93 02/03/2022   GLUCAP 98 02/01/2022   GLUCAP 107 (H) 02/01/2022   GLUCAP 182 (H) 08/08/2021     Pulmonary Assessment Scores:  Pulmonary Assessment Scores     Row Name 01/24/22 1106         ADL UCSD   ADL Phase Entry     SOB Score total 33       CAT Score   CAT Score 13       mMRC Score   mMRC Score 1             UCSD: Self-administered rating of dyspnea associated with activities of daily living (ADLs) 6-point scale (0 = "not at all" to 5 = "maximal or unable to do because of breathlessness")  Scoring Scores range from 0 to 120.  Minimally important difference is 5 units  CAT: CAT can identify the health impairment of COPD patients and is better correlated with disease progression.  CAT has a scoring range of zero to 40. The CAT score is classified into four groups of low (less than 10), medium (10 - 20), high (21-30) and very high (31-40) based on the impact level of disease on health status. A CAT score over 10 suggests significant symptoms.  A worsening CAT score could be explained by an exacerbation, poor medication adherence, poor inhaler technique, or progression of COPD or comorbid conditions.  CAT MCID is 2 points  mMRC: mMRC (Modified Medical Research Council) Dyspnea Scale is used to  assess the degree of baseline functional disability in patients of respiratory disease due to dyspnea. No minimal important difference is established. A decrease in score of 1 point or greater is considered a positive change.   Pulmonary Function Assessment:  Pulmonary Function Assessment - 01/24/22 1048       Breath   Bilateral Breath Sounds Clear;Decreased    Shortness  of Breath Fear of Shortness of Breath;Limiting activity;Panic with Shortness of Breath;Yes             Exercise Target Goals: Exercise Program Goal: Individual exercise prescription set using results from initial 6 min walk test and THRR while considering  patient's activity barriers and safety.   Exercise Prescription Goal: Initial exercise prescription builds to 30-45 minutes a day of aerobic activity, 2-3 days per week.  Home exercise guidelines will be given to patient during program as part of exercise prescription that the participant will acknowledge.  Activity Barriers & Risk Stratification:  Activity Barriers & Cardiac Risk Stratification - 01/24/22 1049       Activity Barriers & Cardiac Risk Stratification   Activity Barriers Arthritis;Deconditioning;Muscular Weakness;Shortness of Breath;History of Falls    Cardiac Risk Stratification Low             6 Minute Walk:  6 Minute Walk     Row Name 01/24/22 1217         6 Minute Walk   Phase Initial     Distance 1032 feet     Walk Time 6 minutes     # of Rest Breaks 0     MPH 1.95     METS 2.28     RPE 13     Perceived Dyspnea  3     VO2 Peak 7.96     Symptoms No     Resting HR 85 bpm     Resting BP 130/72     Resting Oxygen Saturation  96 %     Exercise Oxygen Saturation  during 6 min walk 89 %     Max Ex. HR 114 bpm     Max Ex. BP 150/86     2 Minute Post BP 134/70       Interval HR   1 Minute HR 101     2 Minute HR 109     3 Minute HR 112     4 Minute HR 114     5 Minute HR 112     6 Minute HR 112     2 Minute Post HR 84      Interval Heart Rate? Yes       Interval Oxygen   Interval Oxygen? Yes     Baseline Oxygen Saturation % 96 %     1 Minute Oxygen Saturation % 96 %     1 Minute Liters of Oxygen 2 L     2 Minute Oxygen Saturation % 92 %     2 Minute Liters of Oxygen 2 L     3 Minute Oxygen Saturation % 91 %     3 Minute Liters of Oxygen 2 L     4 Minute Oxygen Saturation % 90 %     4 Minute Liters of Oxygen 2 L     5 Minute Oxygen Saturation % 90 %     5 Minute Liters of Oxygen 2 L     6 Minute Oxygen Saturation % 89 %     6 Minute Liters of Oxygen 2 L     2 Minute Post Oxygen Saturation % 97 %     2 Minute Post Liters of Oxygen 2 L              Oxygen Initial Assessment:  Oxygen Initial Assessment - 01/24/22 1047       Home Oxygen   Home Oxygen Device E-Tanks;Home Concentrator  Sleep Oxygen Prescription Continuous    Liters per minute 2    Home Exercise Oxygen Prescription Continuous    Liters per minute 2    Home Resting Oxygen Prescription Continuous    Liters per minute 2    Compliance with Home Oxygen Use Yes      Initial 6 min Walk   Oxygen Used Continuous    Liters per minute 2      Program Oxygen Prescription   Program Oxygen Prescription Continuous    Liters per minute 2      Intervention   Short Term Goals To learn and exhibit compliance with exercise, home and travel O2 prescription;To learn and understand importance of monitoring SPO2 with pulse oximeter and demonstrate accurate use of the pulse oximeter.;To learn and understand importance of maintaining oxygen saturations>88%;To learn and demonstrate proper pursed lip breathing techniques or other breathing techniques. ;To learn and demonstrate proper use of respiratory medications    Long  Term Goals Exhibits compliance with exercise, home  and travel O2 prescription;Verbalizes importance of monitoring SPO2 with pulse oximeter and return demonstration;Maintenance of O2 saturations>88%;Exhibits proper breathing  techniques, such as pursed lip breathing or other method taught during program session;Compliance with respiratory medication;Demonstrates proper use of MDI's             Oxygen Re-Evaluation:  Oxygen Re-Evaluation     Row Name 02/09/22 1017 03/07/22 1001           Program Oxygen Prescription   Program Oxygen Prescription Continuous Continuous      Liters per minute 2 2        Home Oxygen   Home Oxygen Device E-Tanks;Home Concentrator E-Tanks;Home Concentrator      Sleep Oxygen Prescription Continuous Continuous      Liters per minute 2 2      Home Exercise Oxygen Prescription Continuous Continuous      Liters per minute 2 2      Home Resting Oxygen Prescription Continuous Continuous      Liters per minute 2 2      Compliance with Home Oxygen Use Yes Yes        Goals/Expected Outcomes   Short Term Goals To learn and exhibit compliance with exercise, home and travel O2 prescription;To learn and understand importance of monitoring SPO2 with pulse oximeter and demonstrate accurate use of the pulse oximeter.;To learn and understand importance of maintaining oxygen saturations>88%;To learn and demonstrate proper pursed lip breathing techniques or other breathing techniques. ;To learn and demonstrate proper use of respiratory medications To learn and exhibit compliance with exercise, home and travel O2 prescription;To learn and understand importance of monitoring SPO2 with pulse oximeter and demonstrate accurate use of the pulse oximeter.;To learn and understand importance of maintaining oxygen saturations>88%;To learn and demonstrate proper pursed lip breathing techniques or other breathing techniques. ;To learn and demonstrate proper use of respiratory medications      Long  Term Goals Exhibits compliance with exercise, home  and travel O2 prescription;Verbalizes importance of monitoring SPO2 with pulse oximeter and return demonstration;Maintenance of O2 saturations>88%;Exhibits proper  breathing techniques, such as pursed lip breathing or other method taught during program session;Compliance with respiratory medication;Demonstrates proper use of MDI's Exhibits compliance with exercise, home  and travel O2 prescription;Verbalizes importance of monitoring SPO2 with pulse oximeter and return demonstration;Maintenance of O2 saturations>88%;Exhibits proper breathing techniques, such as pursed lip breathing or other method taught during program session;Compliance with respiratory medication;Demonstrates proper use of MDI's  Comments -- no O2 change      Goals/Expected Outcomes Compliance and unerstanding of oxygen saturation monitoring and breathing techniques to decrease shortness of breath. Compliance and unerstanding of oxygen saturation monitoring and breathing techniques to decrease shortness of breath.               Oxygen Discharge (Final Oxygen Re-Evaluation):  Oxygen Re-Evaluation - 03/07/22 1001       Program Oxygen Prescription   Program Oxygen Prescription Continuous    Liters per minute 2      Home Oxygen   Home Oxygen Device E-Tanks;Home Concentrator    Sleep Oxygen Prescription Continuous    Liters per minute 2    Home Exercise Oxygen Prescription Continuous    Liters per minute 2    Home Resting Oxygen Prescription Continuous    Liters per minute 2    Compliance with Home Oxygen Use Yes      Goals/Expected Outcomes   Short Term Goals To learn and exhibit compliance with exercise, home and travel O2 prescription;To learn and understand importance of monitoring SPO2 with pulse oximeter and demonstrate accurate use of the pulse oximeter.;To learn and understand importance of maintaining oxygen saturations>88%;To learn and demonstrate proper pursed lip breathing techniques or other breathing techniques. ;To learn and demonstrate proper use of respiratory medications    Long  Term Goals Exhibits compliance with exercise, home  and travel O2  prescription;Verbalizes importance of monitoring SPO2 with pulse oximeter and return demonstration;Maintenance of O2 saturations>88%;Exhibits proper breathing techniques, such as pursed lip breathing or other method taught during program session;Compliance with respiratory medication;Demonstrates proper use of MDI's    Comments no O2 change    Goals/Expected Outcomes Compliance and unerstanding of oxygen saturation monitoring and breathing techniques to decrease shortness of breath.             Initial Exercise Prescription:  Initial Exercise Prescription - 01/24/22 1200       Date of Initial Exercise RX and Referring Provider   Date 01/24/22    Referring Provider [name]    Expected Discharge Date 03/31/22      Oxygen   Oxygen Continuous    Liters 2    Maintain Oxygen Saturation 88% or higher      Recumbant Bike   Level --    Watts --    Minutes --      NuStep   Level 1    SPM 60    Minutes 15      Arm Ergometer   Level 1    Watts 5    Minutes 15      Prescription Details   Frequency (times per week) 2    Duration Progress to 30 minutes of continuous aerobic without signs/symptoms of physical distress      Intensity   THRR 40-80% of Max Heartrate 61-122    Ratings of Perceived Exertion 11-13    Perceived Dyspnea 0-4      Progression   Progression Continue to progress workloads to maintain intensity without signs/symptoms of physical distress.      Resistance Training   Training Prescription Yes    Weight red bands    Reps 10-15             Perform Capillary Blood Glucose checks as needed.  Exercise Prescription Changes:   Exercise Prescription Changes     Row Name 02/01/22 1500 02/15/22 1500 02/22/22 1500 02/22/22 1509       Response to Exercise  Blood Pressure (Admit) 132/76 160/82 -- 150/84    Blood Pressure (Exercise) 126/70 180/90 -- --    Blood Pressure (Exit) 130/76 164/72 -- 128/84    Heart Rate (Admit) 70 bpm 89 bpm -- 86 bpm     Heart Rate (Exercise) 96 bpm 106 bpm -- 105 bpm    Heart Rate (Exit) 87 bpm 97 bpm -- 82 bpm    Oxygen Saturation (Admit) 97 % 96 % -- 97 %    Oxygen Saturation (Exercise) 96 % 93 % -- 92 %    Oxygen Saturation (Exit) 93 % 96 % -- 96 %    Rating of Perceived Exertion (Exercise) 13 13 -- 13    Perceived Dyspnea (Exercise) 1 1 -- 2    Duration Progress to 30 minutes of  aerobic without signs/symptoms of physical distress Continue with 30 min of aerobic exercise without signs/symptoms of physical distress. -- Continue with 30 min of aerobic exercise without signs/symptoms of physical distress.    Intensity THRR unchanged THRR unchanged -- THRR unchanged      Progression   Progression -- -- -- Continue to progress workloads to maintain intensity without signs/symptoms of physical distress.      Resistance Training   Training Prescription Yes Yes -- Yes    Weight red bands red bands -- blue bands    Reps 10-15 10-15 -- 10-15    Time 10 Minutes 10 Minutes -- 10 Minutes      Oxygen   Oxygen Continuous Continuous -- Continuous    Liters 2 2 -- 2      NuStep   Level 1 4 -- 5    SPM 60 80 -- 80    Minutes 15 15 -- 15    METs 1.6 2 -- 2.3      Arm Ergometer   Level 1 4 -- 4    Minutes 15 15 -- 15    METs 1.6 2.8 -- 3.9      Home Exercise Plan   Plans to continue exercise at -- -- Home (comment)  walking --    Frequency -- -- Add 1 additional day to program exercise sessions. --    Initial Home Exercises Provided -- -- 02/22/22 --      Oxygen   Maintain Oxygen Saturation 88% or higher -- -- 88% or higher             Exercise Comments:   Exercise Comments     Row Name 02/22/22 1522           Exercise Comments Completed home exercise plan today. Pt is currently not exercising at home. Discussed pt starting to walk outside or on her treadmill, building up to 30 minutes. Pt has a track near her house. Encouraged adding 1-2 nonrehab days. Pt agreeable.                 Exercise Goals and Review:   Exercise Goals     Row Name 01/24/22 1049 02/09/22 1008 03/07/22 0958         Exercise Goals   Increase Physical Activity Yes Yes Yes     Intervention Provide advice, education, support and counseling about physical activity/exercise needs.;Develop an individualized exercise prescription for aerobic and resistive training based on initial evaluation findings, risk stratification, comorbidities and participant's personal goals. Provide advice, education, support and counseling about physical activity/exercise needs.;Develop an individualized exercise prescription for aerobic and resistive training based on initial evaluation findings, risk stratification, comorbidities  and participant's personal goals. Provide advice, education, support and counseling about physical activity/exercise needs.;Develop an individualized exercise prescription for aerobic and resistive training based on initial evaluation findings, risk stratification, comorbidities and participant's personal goals.     Expected Outcomes Short Term: Attend rehab on a regular basis to increase amount of physical activity.;Long Term: Add in home exercise to make exercise part of routine and to increase amount of physical activity.;Long Term: Exercising regularly at least 3-5 days a week. Short Term: Attend rehab on a regular basis to increase amount of physical activity.;Long Term: Add in home exercise to make exercise part of routine and to increase amount of physical activity.;Long Term: Exercising regularly at least 3-5 days a week. Short Term: Attend rehab on a regular basis to increase amount of physical activity.;Long Term: Add in home exercise to make exercise part of routine and to increase amount of physical activity.;Long Term: Exercising regularly at least 3-5 days a week.     Increase Strength and Stamina Yes Yes Yes     Intervention Provide advice, education, support and counseling about physical  activity/exercise needs.;Develop an individualized exercise prescription for aerobic and resistive training based on initial evaluation findings, risk stratification, comorbidities and participant's personal goals. Provide advice, education, support and counseling about physical activity/exercise needs.;Develop an individualized exercise prescription for aerobic and resistive training based on initial evaluation findings, risk stratification, comorbidities and participant's personal goals. Provide advice, education, support and counseling about physical activity/exercise needs.;Develop an individualized exercise prescription for aerobic and resistive training based on initial evaluation findings, risk stratification, comorbidities and participant's personal goals.     Expected Outcomes Short Term: Perform resistance training exercises routinely during rehab and add in resistance training at home;Short Term: Increase workloads from initial exercise prescription for resistance, speed, and METs.;Long Term: Improve cardiorespiratory fitness, muscular endurance and strength as measured by increased METs and functional capacity (6MWT) Short Term: Perform resistance training exercises routinely during rehab and add in resistance training at home;Short Term: Increase workloads from initial exercise prescription for resistance, speed, and METs.;Long Term: Improve cardiorespiratory fitness, muscular endurance and strength as measured by increased METs and functional capacity (6MWT) Short Term: Perform resistance training exercises routinely during rehab and add in resistance training at home;Short Term: Increase workloads from initial exercise prescription for resistance, speed, and METs.;Long Term: Improve cardiorespiratory fitness, muscular endurance and strength as measured by increased METs and functional capacity (6MWT)     Able to understand and use rate of perceived exertion (RPE) scale Yes Yes Yes     Intervention  Provide education and explanation on how to use RPE scale Provide education and explanation on how to use RPE scale Provide education and explanation on how to use RPE scale     Expected Outcomes Short Term: Able to use RPE daily in rehab to express subjective intensity level;Long Term:  Able to use RPE to guide intensity level when exercising independently Short Term: Able to use RPE daily in rehab to express subjective intensity level;Long Term:  Able to use RPE to guide intensity level when exercising independently Short Term: Able to use RPE daily in rehab to express subjective intensity level;Long Term:  Able to use RPE to guide intensity level when exercising independently     Able to understand and use Dyspnea scale Yes Yes Yes     Intervention Provide education and explanation on how to use Dyspnea scale Provide education and explanation on how to use Dyspnea scale Provide education and explanation on  how to use Dyspnea scale     Expected Outcomes Short Term: Able to use Dyspnea scale daily in rehab to express subjective sense of shortness of breath during exertion;Long Term: Able to use Dyspnea scale to guide intensity level when exercising independently Short Term: Able to use Dyspnea scale daily in rehab to express subjective sense of shortness of breath during exertion;Long Term: Able to use Dyspnea scale to guide intensity level when exercising independently Short Term: Able to use Dyspnea scale daily in rehab to express subjective sense of shortness of breath during exertion;Long Term: Able to use Dyspnea scale to guide intensity level when exercising independently     Knowledge and understanding of Target Heart Rate Range (THRR) Yes Yes Yes     Intervention Provide education and explanation of THRR including how the numbers were predicted and where they are located for reference Provide education and explanation of THRR including how the numbers were predicted and where they are located for  reference Provide education and explanation of THRR including how the numbers were predicted and where they are located for reference     Expected Outcomes Short Term: Able to state/look up THRR;Long Term: Able to use THRR to govern intensity when exercising independently;Short Term: Able to use daily as guideline for intensity in rehab Short Term: Able to state/look up THRR;Long Term: Able to use THRR to govern intensity when exercising independently;Short Term: Able to use daily as guideline for intensity in rehab Short Term: Able to state/look up THRR;Long Term: Able to use THRR to govern intensity when exercising independently;Short Term: Able to use daily as guideline for intensity in rehab     Understanding of Exercise Prescription Yes Yes Yes     Intervention Provide education, explanation, and written materials on patient's individual exercise prescription Provide education, explanation, and written materials on patient's individual exercise prescription Provide education, explanation, and written materials on patient's individual exercise prescription     Expected Outcomes Short Term: Able to explain program exercise prescription;Long Term: Able to explain home exercise prescription to exercise independently Short Term: Able to explain program exercise prescription;Long Term: Able to explain home exercise prescription to exercise independently Short Term: Able to explain program exercise prescription;Long Term: Able to explain home exercise prescription to exercise independently              Exercise Goals Re-Evaluation :  Exercise Goals Re-Evaluation     [place] Name 02/09/22 1013 03/07/22 0958           Exercise Goal Re-Evaluation   Exercise Goals Review Increase Physical Activity;Increase Strength and Stamina;Able to understand and use rate of perceived exertion (RPE) scale;Able to understand and use Dyspnea scale;Knowledge and understanding of Target Heart Rate Range  (THRR);Understanding of Exercise Prescription Increase Physical Activity;Increase Strength and Stamina;Able to understand and use rate of perceived exertion (RPE) scale;Able to understand and use Dyspnea scale;Knowledge and understanding of Target Heart Rate Range (THRR);Understanding of Exercise Prescription      Comments Danelly has completed 3 exercise sessions Keora exercises for 15 min on arm ergometer at level 2, 2.2 METs. She then exercises on the nustep at level 4 for 15 min, 1.8 METs. Elenore performed the warmup and cooldown standing without limitations. She is motivated to exercise. It is too soon to note and discernable progressions. Will continue to monitor and progress as able. Tania has completed 7 exercise sessions. Mathea has missed the last 2 exercise sessions due to covid infection. Finnley exercises for 15 min  on arm ergometer at level 4, 3.9 METs. She then exercises on the nustep at level 5 for 15 min, 2.3 METs. She has been progressing well with exercise until she got sick.  Ta performed the warmup and cooldown standing without limitations. She is motivated to exercise. Her home exercise plan was discussed recently and she was planning to begin. Will continue to monitor and progress as able.      Expected Outcomes Through exercise at rehab and home, the patient will decrease shortness of breath with daily activities and feel confident in carrying out an exercise regimen at home. Through exercise at rehab and home, the patient will decrease shortness of breath with daily activities and feel confident in carrying out an exercise regimen at home.               Discharge Exercise Prescription (Final Exercise Prescription Changes):  Exercise Prescription Changes - 02/22/22 1509       Response to Exercise   Blood Pressure (Admit) 150/84    Blood Pressure (Exit) 128/84    Heart Rate (Admit) 86 bpm    Heart Rate (Exercise) 105 bpm    Heart Rate (Exit) 82 bpm    Oxygen Saturation  (Admit) 97 %    Oxygen Saturation (Exercise) 92 %    Oxygen Saturation (Exit) 96 %    Rating of Perceived Exertion (Exercise) 13    Perceived Dyspnea (Exercise) 2    Duration Continue with 30 min of aerobic exercise without signs/symptoms of physical distress.    Intensity THRR unchanged      Progression   Progression Continue to progress workloads to maintain intensity without signs/symptoms of physical distress.      Resistance Training   Training Prescription Yes    Weight blue bands    Reps 10-15    Time 10 Minutes      Oxygen   Oxygen Continuous    Liters 2      NuStep   Level 5    SPM 80    Minutes 15    METs 2.3      Arm Ergometer   Level 4    Minutes 15    METs 3.9      Oxygen   Maintain Oxygen Saturation 88% or higher             Nutrition:  Target Goals: Understanding of nutrition guidelines, daily intake of sodium '1500mg'$ , cholesterol '200mg'$ , calories 30% from fat and 7% or less from saturated fats, daily to have 5 or more servings of fruits and vegetables.  Biometrics:    Nutrition Therapy Plan and Nutrition Goals:  Nutrition Therapy & Goals - 03/01/22 1535       Nutrition Therapy   Diet Heart healthy/carbohydrate consistent    Drug/Food Interactions Statins/Certain Fruits      Personal Nutrition Goals   Nutrition Goal Patient to choose a daily variety of fruits, vegetables, whole grains, lean protein/plant protein, nonfat dairy as part of heart healthy lifestyle    Personal Goal #2 Patient to limit to '1500mg'$  of sodium daily    Personal Goal #3 Patient to identify strategies for blood sugar control.    Comments Goals in progress. Patient recently started on Losartan (02/17/2022) r/t hypertension. Will continue to encourage reduction of high sodium foods, added salt, etc. Patient verbally reported not adding salt to her foods.      Intervention Plan   Intervention Prescribe, educate and counsel regarding individualized specific dietary  modifications aiming  towards targeted core components such as weight, hypertension, lipid management, diabetes, heart failure and other comorbidities.;Nutrition handout(s) given to patient.    Expected Outcomes Short Term Goal: Understand basic principles of dietary content, such as calories, fat, sodium, cholesterol and nutrients.;Long Term Goal: Adherence to prescribed nutrition plan.             Nutrition Assessments:  MEDIFICTS Score Key: ?70 Need to make dietary changes  40-70 Heart Healthy Diet ? 40 Therapeutic Level Cholesterol Diet   Picture Your Plate Scores: <34 Unhealthy dietary pattern with much room for improvement. 41-50 Dietary pattern unlikely to meet recommendations for good health and room for improvement. 51-60 More healthful dietary pattern, with some room for improvement.  >60 Healthy dietary pattern, although there may be some specific behaviors that could be improved.    Nutrition Goals Re-Evaluation:  Nutrition Goals Re-Evaluation     [place] Name 02/01/22 1545 03/01/22 1535           Goals   Current Weight 197 lb 12 oz (89.7 kg) 195 lb 8.8 oz (88.7 kg)  Weight from 9/12, patient out r/t to covid 9/19, 9/21.      Comment A1c 5.8 No new labs at this time. Patient has maintained weight since starting with our program.      Expected Outcome Discussed high protein/high fiber snacks and the plate method as a guide for meal planning to support blood sugar control. Stressed the importance of eating prior to exercise to support blood sugar. Goals in progress. Patient recently started on Losartan (02/17/2022) r/t hypertension. Will continue to encourage reduction of high sodium foods, added salt, etc. Patient verbally reported not added salt to her foods.               Nutrition Goals Discharge (Final Nutrition Goals Re-Evaluation):  Nutrition Goals Re-Evaluation - 03/01/22 1535       Goals   Current Weight 195 lb 8.8 oz (88.7 kg)   Weight from 9/12, patient  out r/t to covid 9/19, 9/21.   Comment No new labs at this time. Patient has maintained weight since starting with our program.    Expected Outcome Goals in progress. Patient recently started on Losartan (02/17/2022) r/t hypertension. Will continue to encourage reduction of high sodium foods, added salt, etc. Patient verbally reported not added salt to her foods.             Psychosocial: Target Goals: Acknowledge presence or absence of significant depression and/or stress, maximize coping skills, provide positive support system. Participant is able to verbalize types and ability to use techniques and skills needed for reducing stress and depression.  Initial Review & Psychosocial Screening:  Initial Psych Review & Screening - 01/24/22 1046       Initial Review   Current issues with None Identified      Family Dynamics   Good Support System? Yes    Comments family      Barriers   Psychosocial barriers to participate in program There are no identifiable barriers or psychosocial needs.      Screening Interventions   Interventions Encouraged to exercise             Quality of Life Scores:  Scores of 19 and below usually indicate a poorer quality of life in these areas.  A difference of  2-3 points is a clinically meaningful difference.  A difference of 2-3 points in the total score of the Quality of Life Index has been associated  with significant improvement in overall quality of life, self-image, physical symptoms, and general health in studies assessing change in quality of life.  PHQ-9: Review Flowsheet       01/24/2022 07/23/2021  Depression screen PHQ 2/9  Decreased Interest 0 1  Down, Depressed, Hopeless 0 1  PHQ - 2 Score 0 2  Altered sleeping 0 1  Tired, decreased energy 0 1  Change in appetite 0 0  Feeling bad or failure about yourself  0 0  Trouble concentrating 0 0  Moving slowly or fidgety/restless 0 0  Suicidal thoughts 0 0  PHQ-9 Score 0 4  Difficult  doing work/chores - Somewhat difficult   Interpretation of Total Score  Total Score Depression Severity:  1-4 = Minimal depression, 5-9 = Mild depression, 10-14 = Moderate depression, 15-19 = Moderately severe depression, 20-27 = Severe depression   Psychosocial Evaluation and Intervention:  Psychosocial Evaluation - 01/24/22 1046       Psychosocial Evaluation & Interventions   Interventions Encouraged to exercise with the program and follow exercise prescription    Expected Outcomes For Amandeep to participate in Pulmonary Rehab    Continue Psychosocial Services  Follow up required by staff             Psychosocial Re-Evaluation:  Psychosocial Re-Evaluation     [place] Name 02/08/22 0942 03/09/22 0947           Psychosocial Re-Evaluation   Current issues with None Identified None Identified      Comments No change in psychosocial status since orientation to pulmonary rehab 01/24/2022.  Her husband drives her to class and is very supportive, she has no signs of depression and seems to enjoy exercising in the program.  No barriers or psychosocial concerns are identified at this time. Delynda has been participating in the PR program without any psychosocial barriers. She seems to be enjoying the program and having social interaction with her class members. Her husband is very supportive driving her to class. No psychosocial needs identified at this time.      Expected Outcomes For Cherylynn to continue to be without barriers or psychosocial concerns while participating in pulmonary rehab. For her to continue to attend the program without any psychosocail conerns or issues.      Interventions Encouraged to attend Pulmonary Rehabilitation for the exercise Encouraged to attend Pulmonary Rehabilitation for the exercise      Continue Psychosocial Services  No Follow up required No Follow up required               Psychosocial Discharge (Final Psychosocial Re-Evaluation):  Psychosocial  Re-Evaluation - 03/09/22 0947       Psychosocial Re-Evaluation   Current issues with None Identified    Comments Georgann has been participating in the PR program without any psychosocial barriers. She seems to be enjoying the program and having social interaction with her class members. Her husband is very supportive driving her to class. No psychosocial needs identified at this time.    Expected Outcomes For her to continue to attend the program without any psychosocail conerns or issues.    Interventions Encouraged to attend Pulmonary Rehabilitation for the exercise    Continue Psychosocial Services  No Follow up required             Education: Education Goals: Education classes will be provided on a weekly basis, covering required topics. Participant will state understanding/return demonstration of topics presented.  Learning Barriers/Preferences:  Learning Barriers/Preferences - 01/24/22 1046  Learning Barriers/Preferences   Learning Barriers None    Learning Preferences Individual Instruction;Computer/Internet;Pictoral;Written Material;Video;Verbal Instruction             Education Topics: Introduction to Pulmonary Rehab Group instruction provided by PowerPoint, verbal discussion, and written material to support subject matter. Instructor reviews what Pulmonary Rehab is, the purpose of the program, and how patients are referred.     Know Your Numbers Group instruction that is supported by a PowerPoint presentation. Instructor discusses importance of knowing and understanding resting, exercise, and post-exercise oxygen saturation, heart rate, and blood pressure. Oxygen saturation, heart rate, blood pressure, rating of perceived exertion, and dyspnea are reviewed along with a normal range for these values.    Exercise for the Pulmonary Patient Group instruction that is supported by a PowerPoint presentation. Instructor discusses benefits of exercise, core components  of exercise, frequency, duration, and intensity of an exercise routine, importance of utilizing pulse oximetry during exercise, safety while exercising, and options of places to exercise outside of rehab.       MET Level  Group instruction provided by PowerPoint, verbal discussion, and written material to support subject matter. Instructor reviews what METs are and how to increase METs.    Pulmonary Medications Verbally interactive group education provided by instructor with focus on inhaled medications and proper administration. Flowsheet Row PULMONARY REHAB CHRONIC OBSTRUCTIVE PULMONARY DISEASE from 02/17/2022 in [place]  Date 02/03/22  Educator Donnetta Simpers  Instruction Review Code 2- Demonstrated Understanding       Anatomy and Physiology of the Respiratory System Group instruction provided by PowerPoint, verbal discussion, and written material to support subject matter. Instructor reviews respiratory cycle and anatomical components of the respiratory system and their functions. Instructor also reviews differences in obstructive and restrictive respiratory diseases with examples of each.  Flowsheet Row PULMONARY REHAB CHRONIC OBSTRUCTIVE PULMONARY DISEASE from 02/17/2022 in [place]  Date 02/10/22  Educator Donnetta Simpers  [Handout]  Instruction Review Code 1- Verbalizes Understanding       Oxygen Safety Group instruction provided by PowerPoint, verbal discussion, and written material to support subject matter. There is an overview of "What is Oxygen" and "Why do we need it".  Instructor also reviews how to create a safe environment for oxygen use, the importance of using oxygen as prescribed, and the risks of noncompliance. There is a brief discussion on traveling with oxygen and resources the patient may utilize. Flowsheet Row PULMONARY REHAB CHRONIC OBSTRUCTIVE PULMONARY DISEASE from 02/17/2022 in [place]  Date 02/17/22  Educator Donnetta Simpers  Instruction Review Code 1- Verbalizes Understanding       Oxygen Use Group instruction provided by PowerPoint, verbal discussion, and written material to discuss how supplemental oxygen is prescribed and different types of oxygen supply systems. Resources for more information are provided.    Breathing Techniques Group instruction that is supported by demonstration and informational handouts. Instructor discusses the benefits of pursed lip and diaphragmatic breathing and detailed demonstration on how to perform both.     Risk Factor Reduction Group instruction that is supported by a PowerPoint presentation. Instructor discusses the definition of a risk factor, different risk factors for pulmonary disease, and how the heart and lungs work together.   MD Day A group question and answer session with a medical doctor that allows participants to ask questions that relate to their pulmonary disease state.   Nutrition for the Pulmonary Patient Group instruction provided by PowerPoint  slides, verbal discussion, and written materials to support subject matter. The instructor gives an explanation and review of healthy diet recommendations, which includes a discussion on weight management, recommendations for fruit and vegetable consumption, as well as protein, fluid, caffeine, fiber, sodium, sugar, and alcohol. Tips for eating when patients are short of breath are discussed.    Other Education Group or individual verbal, written, or video instructions that support the educational goals of the pulmonary rehab program. Flowsheet Row PULMONARY REHAB CHRONIC OBSTRUCTIVE PULMONARY DISEASE from 02/17/2022 in [place]  Date 07/29/21  Educator [employer] H/O  Instruction Review Code 1- Verbalizes Understanding        Knowledge Questionnaire Score:   Core Components/Risk Factors/Patient Goals at Admission:  Personal  Goals and Risk Factors at Admission - 01/24/22 1047       Core Components/Risk Factors/Patient Goals on Admission    Weight Management --    Intervention --    Expected Outcomes --    Improve shortness of breath with ADL's Yes    Intervention Provide education, individualized exercise plan and daily activity instruction to help decrease symptoms of SOB with activities of daily living.    Expected Outcomes Short Term: Improve cardiorespiratory fitness to achieve a reduction of symptoms when performing ADLs;Long Term: Be able to perform more ADLs without symptoms or delay the onset of symptoms    Increase knowledge of respiratory medications and ability to use respiratory devices properly  Yes    Intervention Provide education and demonstration as needed of appropriate use of medications, inhalers, and oxygen therapy.    Expected Outcomes Short Term: Achieves understanding of medications use. Understands that oxygen is a medication prescribed by physician. Demonstrates appropriate use of inhaler and oxygen therapy.;Long Term: Maintain appropriate use of medications, inhalers, and oxygen therapy.             Core Components/Risk Factors/Patient Goals Review:   Goals and Risk Factor Review     Row Name 02/08/22 0947             Core Components/Risk Factors/Patient Goals Review   Personal Goals Review Weight Management/Obesity;Increase knowledge of respiratory medications and ability to use respiratory devices properly.;Improve shortness of breath with ADL's;Develop more efficient breathing techniques such as purse lipped breathing and diaphragmatic breathing and practicing self-pacing with activity.       Review Arushi has finally started pulmonary rehab and has attended 2 exercise sessions.  It is too early to see progression of weight loss and program goals.  Her CBG's has been WNL with exercise as she takes metformin.  Should see improvement in goals in the next full 30 days.        Expected Outcomes See admission goals.                Core Components/Risk Factors/Patient Goals at Discharge (Final Review):   Goals and Risk Factor Review - 02/08/22 0947       Core Components/Risk Factors/Patient Goals Review   Personal Goals Review Weight Management/Obesity;Increase knowledge of respiratory medications and ability to use respiratory devices properly.;Improve shortness of breath with ADL's;Develop more efficient breathing techniques such as purse lipped breathing and diaphragmatic breathing and practicing self-pacing with activity.    Review Aimi has finally started pulmonary rehab and has attended 2 exercise sessions.  It is too early to see progression of weight loss and program goals.  Her CBG's has been WNL with exercise as she takes metformin.  Should see improvement  in goals in the next full 30 days.    Expected Outcomes See admission goals.             ITP Comments: Pt is making expected progress toward Pulmonary Rehab goals after completing 8 sessions. Recommend continued exercise, life style modification, education, and utilization of breathing techniques to increase stamina and strength, while also decreasing shortness of breath with exertion.  Dr. Rodman Pickle is Medical Director for Pulmonary Rehab at [facility].

## 2022-03-10 ENCOUNTER — Encounter (HOSPITAL_COMMUNITY)
Admission: RE | Admit: 2022-03-10 | Discharge: 2022-03-10 | Disposition: A | Discharge status: Home / Self Care | Payer: Medicare Other | Source: Ambulatory Visit | Attending: Internal Medicine | Admitting: Internal Medicine

## 2022-03-10 DIAGNOSIS — J449 Chronic obstructive pulmonary disease, unspecified: Secondary | ICD-10-CM | POA: Diagnosis not present

## 2022-03-10 NOTE — Progress Notes (Signed)
Daily Session Note  Patient Details  Name: Amanda Garrett MRN: [id number] Date of Birth: 03-16-1956 Referring Provider:   [name] Pulmonary Rehab Walk Test from 01/24/2022 in [place]  Referring Provider [name]       Encounter Date: 03/10/2022  Check In:  Session Check In - 03/10/22 1433       Check-In   Supervising physician immediately available to respond to emergencies [facility] - Physician supervision    Physician(s) Dr. Ernest Mallick    Location [facility]    Staff Present Rodney Langton, RN;Carlette Wilber Oliphant, RN, Quentin Ore, MS, ACSM-CEP, Exercise Physiologist;Randi Yevonne Pax, ACSM-CEP, Exercise Physiologist    Virtual Visit No    Medication changes reported     No    Fall or balance concerns reported    No    Tobacco Cessation No Change    Warm-up and Cool-down Performed as group-led instruction    Resistance Training Performed Yes    VAD Patient? No    PAD/SET Patient? No      Pain Assessment   Currently in Pain? No/denies    Multiple Pain Sites No             Capillary Blood Glucose: No results found for this or any previous visit (from the past 24 hour(s)).    Social History   Tobacco Use  Smoking Status Former   Packs/day: 2.00   Years: 34.00   Total pack years: 68.00   Types: Cigarettes   Start date: 1965   Quit date: 06/1996   Years since quitting: 25.7  Smokeless Tobacco Never    Goals Met:  Proper associated with RPD/PD & O2 Sat Exercise tolerated well No report of concerns or symptoms today Strength training completed today  Goals Unmet:  Not Applicable  Comments: Service time is from 1335 to Fidelis    Dr. Rodman Pickle is Medical Director for Pulmonary Rehab at [facility]

## 2022-03-15 ENCOUNTER — Encounter (HOSPITAL_COMMUNITY)
Admission: RE | Admit: 2022-03-15 | Discharge: 2022-03-15 | Disposition: A | Discharge status: Home / Self Care | Payer: Medicare Other | Source: Ambulatory Visit | Attending: Internal Medicine | Admitting: Internal Medicine

## 2022-03-15 VITALS — Wt 194.2 lb

## 2022-03-15 DIAGNOSIS — J449 Chronic obstructive pulmonary disease, unspecified: Secondary | ICD-10-CM | POA: Diagnosis not present

## 2022-03-15 NOTE — Progress Notes (Signed)
Daily Session Note  Patient Details  Name: Amanda Garrett MRN: [id number] Date of Birth: 1956-01-10 Referring Provider:   [name] Pulmonary Rehab Walk Test from 01/24/2022 in [place]  Referring Provider [name]       Encounter Date: 03/15/2022  Check In:  Session Check In - 03/15/22 1427       Check-In   Supervising physician immediately available to respond to emergencies [facility] - Physician supervision    Physician(s) [name]    Location [facility]    Staff Present Rodney Langton, RN;Carlette Wilber Oliphant, RN, Quentin Ore, MS, ACSM-CEP, Exercise Physiologist;Viviene Thurston Yevonne Pax, ACSM-CEP, Exercise Physiologist    Virtual Visit No    Medication changes reported     No    Fall or balance concerns reported    No    Tobacco Cessation No Change    Warm-up and Cool-down Performed as group-led instruction    Resistance Training Performed Yes    VAD Patient? No    PAD/SET Patient? No      Pain Assessment   Currently in Pain? No/denies    Multiple Pain Sites No             Capillary Blood Glucose: No results found for this or any previous visit (from the past 24 hour(s)).   Exercise Prescription Changes - 03/15/22 1500       Response to Exercise   Blood Pressure (Admit) 130/60    Blood Pressure (Exercise) 142/70    Blood Pressure (Exit) 132/66    Heart Rate (Admit) 71 bpm    Heart Rate (Exercise) 97 bpm    Heart Rate (Exit) 83 bpm    Oxygen Saturation (Admit) 99 %    Oxygen Saturation (Exercise) 96 %    Oxygen Saturation (Exit) 96 %    Rating of Perceived Exertion (Exercise) 10.5    Perceived Dyspnea (Exercise) 1    Duration Continue with 30 min of aerobic exercise without signs/symptoms of physical distress.    Intensity THRR unchanged      Progression   Progression Continue to progress workloads to maintain intensity without signs/symptoms of physical distress.      Resistance Training   Training Prescription Yes     Weight blue bands    Reps 10-15    Time 10 Minutes      Oxygen   Oxygen Continuous    Liters 2      NuStep   Level 5    SPM 80    Minutes 15    METs 2.4      Arm Ergometer   Level 4    Minutes 15    METs 2.9      Oxygen   Maintain Oxygen Saturation 88% or higher             Social History   Tobacco Use  Smoking Status Former   Packs/day: 2.00   Years: 34.00   Total pack years: 68.00   Types: Cigarettes   Start date: 54   Quit date: 06/1996   Years since quitting: 25.7  Smokeless Tobacco Never    Goals Met:  Independence with exercise equipment Exercise tolerated well No report of concerns or symptoms today Strength training completed today  Goals Unmet:  Not Applicable  Comments: Service time is from 1323 to 1437.    Dr. Rodman Pickle is Medical Director for Pulmonary Rehab at [facility].

## 2022-03-16 ENCOUNTER — Other Ambulatory Visit: Payer: Self-pay | Admitting: Nurse Practitioner

## 2022-03-16 DIAGNOSIS — J302 Other seasonal allergic rhinitis: Secondary | ICD-10-CM

## 2022-03-17 ENCOUNTER — Encounter (HOSPITAL_COMMUNITY)
Admission: RE | Admit: 2022-03-17 | Discharge: 2022-03-17 | Disposition: A | Discharge status: Home / Self Care | Payer: Medicare Other | Source: Ambulatory Visit | Attending: Internal Medicine | Admitting: Internal Medicine

## 2022-03-17 DIAGNOSIS — J449 Chronic obstructive pulmonary disease, unspecified: Secondary | ICD-10-CM | POA: Diagnosis not present

## 2022-03-17 NOTE — Progress Notes (Signed)
Daily Session Note  Patient Details  Name: Amanda Garrett MRN: [id number] Date of Birth: 07-Jan-1956 Referring Provider:   [name] Pulmonary Rehab Walk Test from 01/24/2022 in [place]  Referring Provider [name]       Encounter Date: 03/17/2022  Check In:  Session Check In - 03/17/22 1342       Check-In   Supervising physician immediately available to respond to emergencies [facility] - Physician supervision    Physician(s) [name]    Location [facility]    Staff Present Rodney Langton, RN;Randi Olen Cordial BS, ACSM-CEP, Exercise Physiologist;David Lilyan Punt, MS, ACSM-CEP, CCRP, Exercise Physiologist;Kaylee Rosana Hoes, MS, ACSM-CEP, Exercise Physiologist    Virtual Visit No    Medication changes reported     No    Fall or balance concerns reported    No    Tobacco Cessation No Change    Warm-up and Cool-down Performed as group-led instruction    Resistance Training Performed Yes    VAD Patient? No    PAD/SET Patient? No      Pain Assessment   Currently in Pain? No/denies    Multiple Pain Sites No             Capillary Blood Glucose: No results found for this or any previous visit (from the past 24 hour(s)).    Social History   Tobacco Use  Smoking Status Former   Packs/day: 2.00   Years: 34.00   Total pack years: 68.00   Types: Cigarettes   Start date: 1965   Quit date: 06/1996   Years since quitting: 25.7  Smokeless Tobacco Never    Goals Met:  Proper associated with RPD/PD & O2 Sat Exercise tolerated well No report of concerns or symptoms today Strength training completed today  Goals Unmet:  Not Applicable  Comments: Service time is from 1324 to [place]    Dr. Rodman Pickle is Medical Director for Pulmonary Rehab at [facility].

## 2022-03-22 ENCOUNTER — Encounter (HOSPITAL_COMMUNITY)
Admission: RE | Admit: 2022-03-22 | Discharge: 2022-03-22 | Disposition: A | Discharge status: Home / Self Care | Payer: Medicare Other | Source: Ambulatory Visit | Attending: Internal Medicine | Admitting: Internal Medicine

## 2022-03-22 DIAGNOSIS — J449 Chronic obstructive pulmonary disease, unspecified: Secondary | ICD-10-CM

## 2022-03-22 NOTE — Progress Notes (Signed)
Daily Session Note  Patient Details  Name: Amanda Garrett MRN: [id number] Date of Birth: 10/11/1955 Referring Provider:   [name] Pulmonary Rehab Walk Test from 01/24/2022 in [place]  Referring Provider [name]       Encounter Date: 03/22/2022  Check In:  Session Check In - 03/22/22 1333       Check-In   Supervising physician immediately available to respond to emergencies [facility] - Physician supervision    Physician(s) Erskine Emery    [facility]    Staff Present Rodney Langton, RN;Randi Olen Cordial BS, ACSM-CEP, Exercise Physiologist;David Lilyan Punt, MS, ACSM-CEP, CCRP, Exercise Physiologist;Asencion Guisinger Rosana Hoes, MS, ACSM-CEP, Exercise Physiologist;Samantha [place], RD, LDN    Virtual Visit No    Medication changes reported     No    Fall or balance concerns reported    No    Tobacco Cessation No Change    Warm-up and Cool-down Performed as group-led instruction    Resistance Training Performed Yes    VAD Patient? No    PAD/SET Patient? No      Pain Assessment   Currently in Pain? No/denies    Multiple Pain Sites No             Capillary Blood Glucose: No results found for this or any previous visit (from the past 24 hour(s)).    Social History   Tobacco Use  Smoking Status Former   Packs/day: 2.00   Years: 34.00   Total pack years: 68.00   Types: Cigarettes   Start date: 1965   Quit date: 06/1996   Years since quitting: 25.7  Smokeless Tobacco Never    Goals Met:  Proper associated with RPD/PD & O2 Sat Independence with exercise equipment Exercise tolerated well No report of concerns or symptoms today Strength training completed today  Goals Unmet:  Not Applicable  Comments: Service time is from 1312 to 1445.    Dr. Rodman Pickle is Medical Director for Pulmonary Rehab at [facility].

## 2022-03-24 ENCOUNTER — Encounter (HOSPITAL_COMMUNITY)
Admission: RE | Admit: 2022-03-24 | Discharge: 2022-03-24 | Disposition: A | Discharge status: Home / Self Care | Payer: Medicare Other | Source: Ambulatory Visit | Attending: Internal Medicine | Admitting: Internal Medicine

## 2022-03-24 DIAGNOSIS — J449 Chronic obstructive pulmonary disease, unspecified: Secondary | ICD-10-CM

## 2022-03-24 NOTE — Progress Notes (Signed)
Daily Session Note  Patient Details  Name: Amanda Garrett MRN: [id number] Date of Birth: 14-Jul-1955 Referring Provider:   [name] Pulmonary Rehab Walk Test from 01/24/2022 in [place]  Referring Provider [name]       Encounter Date: 03/24/2022  Check In:  Session Check In - 03/24/22 1417       Check-In   Supervising physician immediately available to respond to emergencies [facility] - Physician supervision    Physician(s) Erskine Emery    [facility]    Staff Present Rodney Langton, RN;Randi Yevonne Pax, ACSM-CEP, Exercise Physiologist;Kaylee Rosana Hoes, MS, ACSM-CEP, Exercise Physiologist;Samantha [place], RD, LDN;Jetta Walker BS, ACSM-CEP, Exercise Physiologist    Virtual Visit No    Medication changes reported     No    Fall or balance concerns reported    No    Tobacco Cessation No Change    Warm-up and Cool-down Performed as group-led instruction    Resistance Training Performed Yes    VAD Patient? No    PAD/SET Patient? No      Pain Assessment   Currently in Pain? No/denies    Multiple Pain Sites No             Capillary Blood Glucose: No results found for this or any previous visit (from the past 24 hour(s)).    Social History   Tobacco Use  Smoking Status Former   Packs/day: 2.00   Years: 34.00   Total pack years: 68.00   Types: Cigarettes   Start date: 1965   Quit date: 06/1996   Years since quitting: 25.7  Smokeless Tobacco Never    Goals Met:  Proper associated with RPD/PD & O2 Sat Exercise tolerated well No report of concerns or symptoms today Strength training completed today  Goals Unmet:  Not Applicable  Comments: Service time is from 1325 to 1440    Dr. Rodman Pickle is Medical Director for Pulmonary Rehab at [facility].

## 2022-03-27 NOTE — Progress Notes (Unsigned)
Cardiology Office Note:    Date:  03/27/2022   ID:  Amanda Garrett, DOB 1956-01-30, MRN [id number]  PCP:  Leonard Downing, MD   [place] Providers Cardiologist:  None   Referring MD: Ferd Hibbs, NP    History of Present Illness:    Amanda Garrett is a 66 y.o. female with a hx of DMII, COPD, HTN, and HLD who was referred by Ferd Hibbs for further evaluation of elevated heart rates and blood pressure.  Patient seen by Ferd Hibbs, NP on 02/17/22. Note reviewed. Was having both elevated blood pressures and heart rates at pulmonary rehab. She was started on losartan and referred to Cardiology for further evaluation.  Today, ***  Past Medical History:  Diagnosis Date   Asthma    COPD (chronic obstructive pulmonary disease) ([place])    Diabetes mellitus without complication (HCC)    Emphysema of lung ([place])    GERD (gastroesophageal reflux disease)    Hypercholesteremia 2011   Hypertension    Lichen sclerosus et atrophicus    vulva   Oxygen deficiency    Palpitation    on Inderal    Past Surgical History:  Procedure Laterality Date   BIOPSY  03/01/2021   Procedure: BIOPSY;  Surgeon: Yetta Flock, MD;  Location: [facility];  Service: Gastroenterology;;   [place]     COLONOSCOPY WITH PROPOFOL N/A 03/01/2021   Procedure: COLONOSCOPY WITH PROPOFOL;  Surgeon: Yetta Flock, MD;  Location: [facility];  Service: Gastroenterology;  Laterality: N/A;   DILATION AND CURETTAGE, DIAGNOSTIC / THERAPEUTIC  05/2010   hysteroscopy, polypectomy   ESOPHAGOGASTRODUODENOSCOPY (EGD) WITH PROPOFOL N/A 03/01/2021   Procedure: ESOPHAGOGASTRODUODENOSCOPY (EGD) WITH PROPOFOL;  Surgeon: Yetta Flock, MD;  Location: [facility];  Service: Gastroenterology;  Laterality: N/A;   HEMOSTASIS CLIP PLACEMENT  03/01/2021   Procedure: HEMOSTASIS CLIP PLACEMENT;  Surgeon: Yetta Flock, MD;  Location: [facility];  Service: Gastroenterology;;   KNEE ARTHROSCOPY Right 2013   POLYPECTOMY  03/01/2021   Procedure: POLYPECTOMY;  Surgeon: Yetta Flock, MD;  Location: [facility];  Service: Gastroenterology;;   [place]   dr Pearline Cables    Current Medications: No outpatient medications have been marked as taking for the 03/30/22 encounter (Appointment) with Freada Bergeron, MD.     Allergies:   Spiriva respimat [tiotropium bromide monohydrate], Latex, and Nickel   Social History   Socioeconomic History   Marital status: Married    Spouse name: Not on file   Number of children: Not on file   Years of education: 11   Highest education level: 11th grade  Occupational History   Not on file  Tobacco Use   Smoking status: Former    Packs/day: 2.00    Years: 34.00    Total pack years: 68.00    Types: Cigarettes    Start date: 53    Quit date: 06/1996    Years since quitting: 25.8   Smokeless tobacco: Never  Vaping Use   Vaping Use: Never used  Substance and Sexual Activity   Alcohol use: Yes    Comment: rare   Drug use: Never   Sexual activity: Not on file  Other Topics Concern   Not on file  Social History Narrative   Not on file   Social Determinants of Health   Financial Resource Strain: Not on file  Food Insecurity: Not on file  Transportation Needs: Not on file  Physical Activity: Not on file  Stress: Not on file  Social Connections: Not on file     Family History: The patient's ***family history includes Breast cancer in her paternal grandmother; Colon polyps in her mother; Diabetes in her father, maternal grandfather, and mother; Diverticulitis in her mother; Hypertension in her maternal grandfather; Ulcerative colitis in her daughter. There is no history of COPD, Lung cancer, Colon cancer, Esophageal cancer, Pancreatic cancer, Stomach cancer, or Rectal cancer.  ROS:   Please see the history of present illness.    ***  All other systems reviewed and are negative.  EKGs/Labs/Other Studies Reviewed:    The following studies were reviewed today: CT chest 02/23/22: FINDINGS: Cardiovascular: Aortic atherosclerosis. Normal heart size. Left coronary artery calcifications. No pericardial effusion.   Mediastinum/Nodes: No enlarged mediastinal, hilar, or axillary lymph nodes. Thyroid gland, trachea, and esophagus demonstrate no significant findings.   Lungs/Pleura: Moderate centrilobular emphysema. Diffuse bilateral bronchial wall thickening. Bandlike scarring of the medial segment right middle lobe, lingula, and left lower lobe. Unchanged 0.6 cm nodule of the lateral segment right middle lobe, stable and benign (series 5, image 107). New, clustered nodular opacities of the peripheral right upper lobe, measuring in total 1.4 x 1.2 cm (series 5, image 51). No pleural effusion or pneumothorax.   Upper Abdomen: No acute abnormality.  Status post cholecystectomy.   Musculoskeletal: No chest wall abnormality. No acute osseous findings.   IMPRESSION: 1. New, clustered nodular opacities of the peripheral right upper lobe, measuring in total 1.4 x 1.2 cm. These are almost certainly infectious or inflammatory given interval development, however follow-up CT in 3 months is recommended to assess for stability or resolution. 2. Unchanged 0.6 cm nodule of the lateral segment right middle lobe, stable and benign. No specific further follow-up or characterization is required. 3. Consider ongoing annual low-dose CT lung cancer screening if indicated by patient age, smoking history, and/or other risk factors for lung cancer. 4. Emphysema. 5. Coronary artery disease.   Aortic Atherosclerosis (ICD10-I70.0) and Emphysema (ICD10-J43.9).  EKG:  EKG is *** ordered today.  The ekg ordered today demonstrates ***  Recent Labs: 08/05/2021: ALT 22; B Natriuretic Peptide 96.9 08/08/2021: BUN 24; Creatinine, Ser 0.86;  Potassium 3.9; Sodium 137 08/23/2021: Hemoglobin 10.5; Platelets 314.0  Recent Lipid Panel No results found for: "CHOL", "TRIG", "HDL", "CHOLHDL", "VLDL", "LDLCALC", "LDLDIRECT"   Risk Assessment/Calculations:   {Does this patient have ATRIAL FIBRILLATION?[phone]}  No BP recorded.  {Refresh Note OR Click here to enter BP  :1}***         Physical Exam:    VS:  There were no vitals taken for this visit.    Wt Readings from Last 3 Encounters:  03/15/22 194 lb 3.6 oz (88.1 kg)  02/15/22 197 lb 15.6 oz (89.8 kg)  02/01/22 197 lb 12 oz (89.7 kg)     GEN: *** Well nourished, well developed in no acute distress HEENT: Normal NECK: No JVD; No carotid bruits LYMPHATICS: No lymphadenopathy CARDIAC: ***RRR, no murmurs, rubs, gallops RESPIRATORY:  Clear to auscultation without rales, wheezing or rhonchi  ABDOMEN: Soft, non-tender, non-distended MUSCULOSKELETAL:  No edema; No deformity  SKIN: Warm and dry NEUROLOGIC:  Alert and oriented x 3 PSYCHIATRIC:  Normal affect   ASSESSMENT:    No diagnosis found. PLAN:    In order of problems listed above:  #Elevated HR: Has been having HR up *** noted during cardiac rehab. Reports ****  at that time. Will check zio and TTE for further evaluation/ -Check zio monitor -Check TTE -Increase propranolol ***  #HTN: Notably hypertensive with exercise at Pulmonary rehab. Was initiated on losartan by PCP. Currently, *** -Continue losartan '50mg'$  daily -Increase propranolol ***  #Coronary Ca: Noted to have LAD calcification on CT chest. No anginal symptoms. -Continue lipitor '10mg'$  daily -Continue ASA '81mg'$  daily  #HLD: #Aortic atherosclerosis: -Continue lipitor '10mg'$  daily -Goal LDL<70  #Severe COPD: -Follows with Pulm     {Are you ordering a CV Procedure (e.g. stress test, cath, DCCV, TEE, etc)?   Press F2        :[id number]}    Medication Adjustments/Labs and Tests Ordered: Current medicines are reviewed at length with the patient  today.  Concerns regarding medicines are outlined above.  No orders of the defined types were placed in this encounter.  No orders of the defined types were placed in this encounter.   There are no Patient Instructions on file for this visit.   Signed, Freada Bergeron, MD  03/27/2022 2:37 PM    [place]

## 2022-03-29 ENCOUNTER — Encounter (HOSPITAL_COMMUNITY)
Admission: RE | Admit: 2022-03-29 | Discharge: 2022-03-29 | Disposition: A | Discharge status: Home / Self Care | Payer: Medicare Other | Source: Ambulatory Visit | Attending: Internal Medicine | Admitting: Internal Medicine

## 2022-03-29 VITALS — Wt 194.2 lb

## 2022-03-29 DIAGNOSIS — J449 Chronic obstructive pulmonary disease, unspecified: Secondary | ICD-10-CM | POA: Diagnosis not present

## 2022-03-29 NOTE — Progress Notes (Signed)
Daily Session Note  Patient Details  Name: Amanda Garrett MRN: [id number] Date of Birth: 01/05/1956 Referring Provider:   Flowsheet Row Pulmonary Rehab Walk Test from 01/24/2022 in [facility]  Referring Provider Desai       Encounter Date: 03/29/2022  Check In:  Session Check In - 03/29/22 1425       Check-In   Supervising physician immediately available to respond to emergencies [facility] - Physician supervision    Physician(s) Chand    Location [facility]    Staff Present Lisa Hughes, RN;Randi Reeve BS, ACSM-CEP, Exercise Physiologist;Kaylee Davis, MS, ACSM-CEP, Exercise Physiologist;Samantha Spain, RD, LDN;David Makemson, MS, ACSM-CEP, CCRP, Exercise Physiologist    Virtual Visit No    Medication changes reported     No    Fall or balance concerns reported    No    Tobacco Cessation No Change    Warm-up and Cool-down Performed as group-led instruction    Resistance Training Performed Yes    VAD Patient? No    PAD/SET Patient? No      Pain Assessment   Currently in Pain? No/denies    Multiple Pain Sites No             Capillary Blood Glucose: No results found for this or any previous visit (from the past 24 hour(s)).   Exercise Prescription Changes - 03/29/22 1500       Response to Exercise   Blood Pressure (Admit) 120/80    Blood Pressure (Exercise) 164/86    Blood Pressure (Exit) 114/70    Heart Rate (Admit) 73 bpm    Heart Rate (Exercise) 95 bpm    Heart Rate (Exit) 82 bpm    Oxygen Saturation (Admit) 98 %    Oxygen Saturation (Exercise) 93 %    Oxygen Saturation (Exit) 96 %    Rating of Perceived Exertion (Exercise) 11    Perceived Dyspnea (Exercise) 1    Duration Continue with 30 min of aerobic exercise without signs/symptoms of physical distress.    Intensity THRR unchanged      Progression   Progression Continue to progress workloads to maintain intensity without signs/symptoms of physical distress.       Resistance Training   Training Prescription Yes    Weight blue bands    Reps 10-15    Time 10 Minutes      Oxygen   Oxygen Continuous    Liters 2      NuStep   Level 6    SPM 80    Minutes 15    METs 2.6      Arm Ergometer   Level 4    Minutes 15    METs 2.9      Oxygen   Maintain Oxygen Saturation 88% or higher             Social History   Tobacco Use  Smoking Status Former   Packs/day: 2.00   Years: 34.00   Total pack years: 68.00   Types: Cigarettes   Start date: 1965   Quit date: 06/1996   Years since quitting: 25.8  Smokeless Tobacco Never    Goals Met:  Proper associated with RPD/PD & O2 Sat Exercise tolerated well No report of concerns or symptoms today Strength training completed today  Goals Unmet:  Not Applicable  Comments: Service time is from 1325 to 1446.    Dr. Jane Ellison is Medical Director for Pulmonary Rehab at [facility].  

## 2022-03-30 ENCOUNTER — Other Ambulatory Visit (HOSPITAL_COMMUNITY): Payer: Self-pay

## 2022-03-30 ENCOUNTER — Ambulatory Visit: Payer: Medicare Other | Attending: Cardiology

## 2022-03-30 ENCOUNTER — Telehealth: Payer: Self-pay | Admitting: *Deleted

## 2022-03-30 ENCOUNTER — Ambulatory Visit: Payer: Medicare Other | Attending: Cardiology | Admitting: Cardiology

## 2022-03-30 ENCOUNTER — Encounter: Payer: Self-pay | Admitting: Cardiology

## 2022-03-30 VITALS — BP 136/72 | HR 71 | Ht 61.0 in | Wt 194.0 lb

## 2022-03-30 DIAGNOSIS — J449 Chronic obstructive pulmonary disease, unspecified: Secondary | ICD-10-CM

## 2022-03-30 DIAGNOSIS — R002 Palpitations: Secondary | ICD-10-CM

## 2022-03-30 DIAGNOSIS — E7800 Pure hypercholesterolemia, unspecified: Secondary | ICD-10-CM | POA: Diagnosis present

## 2022-03-30 DIAGNOSIS — Z79899 Other long term (current) drug therapy: Secondary | ICD-10-CM

## 2022-03-30 DIAGNOSIS — I2510 Atherosclerotic heart disease of native coronary artery without angina pectoris: Secondary | ICD-10-CM

## 2022-03-30 DIAGNOSIS — I2584 Coronary atherosclerosis due to calcified coronary lesion: Secondary | ICD-10-CM | POA: Diagnosis present

## 2022-03-30 DIAGNOSIS — I700 Atherosclerosis of aorta: Secondary | ICD-10-CM | POA: Diagnosis present

## 2022-03-30 DIAGNOSIS — R072 Precordial pain: Secondary | ICD-10-CM | POA: Diagnosis present

## 2022-03-30 MED ORDER — IVABRADINE HCL 7.5 MG PO TABS
15.0000 mg | ORAL_TABLET | Freq: Once | ORAL | 0 refills | Status: AC
  Filled 2022-03-30: qty 2, 1d supply, fill #0

## 2022-03-30 NOTE — Telephone Encounter (Signed)
-----   Message from Jennefer Bravo sent at 03/30/2022  1:12 PM EDT ----- Regarding: RE: 3 DAY ZIO PER DR. Johney Frame done ----- Message ----- From: Nuala Alpha, LPN Sent: 14/48/1856  12:04 PM EDT To: Nuala Alpha, LPN; Shelly A Wells Subject: 3 DAY ZIO PER DR. Johney Frame                    Dr. Johney Frame ordered a 3 day zio  Please enroll and let me know when you do  Thanks Dorianne Perret

## 2022-03-30 NOTE — Patient Instructions (Addendum)
Medication Instructions:   Your physician recommends that you continue on your current medications as directed. Please refer to the Current Medication list given to you today.  *If you need a refill on your cardiac medications before your next appointment, please call your pharmacy*   Lab Work:  THIS FRIDAY 04/01/22--CHECK BMET AND LIPIDS--COME FASTING  If you have labs (blood work) drawn today and your tests are completely normal, you will receive your results only by: [place] (if you have MyChart) OR A paper copy in the mail If you have any lab test that is abnormal or we need to change your treatment, we will call you to review the results.   Testing/Procedures:  Your physician has requested that you have an echocardiogram. Echocardiography is a painless test that uses sound waves to create images of your heart. It provides your doctor with information about the size and shape of your heart and how well your heart's chambers and valves are working. This procedure takes approximately one hour. There are no restrictions for this procedure. Please do NOT wear cologne, perfume, aftershave, or lotions (deodorant is allowed). Please arrive 15 minutes prior to your appointment time.     Your cardiac CT will be scheduled at one of the below locations:   [facility] [address] [place], [place] [address] [phone]   If scheduled at [facility], please arrive at the [facility] and Children's Entrance (Entrance C2) of [facility] 30 minutes prior to test start time. You can use the FREE valet parking offered at entrance C (encouraged to control the heart rate for the test)  Proceed to the [facility] Radiology Department (first floor) to check-in and test prep.  All radiology patients and guests should use entrance C2 at [facility], accessed from [facility], even though the hospital's physical address listed is [address]      Please follow these instructions carefully (unless otherwise directed):   On the Night Before the Test: Be sure to Drink plenty of water. Do not consume any caffeinated/decaffeinated beverages or chocolate 12 hours prior to your test. Do not take any antihistamines (CLARITIN AND XYZAL) 12 hours prior to your test.   On the Day of the Test: Drink plenty of water until 1 hour prior to the test. Do not eat any food 1 hour prior to test. You may take your regular medications prior to the test.  Take IVABRADINE (CORLANOR) 15 MG BY MOUTH two hours prior to test.  DO NOT TAKE YOUR PROPRANOLOL THE MORNING OF THIS TEST--THIS MEDICATION WAS SENT TO [facility] FOR YOU TO PICK UP FEMALES- please wear underwire-free bra if available, avoid dresses & tight clothing       After the Test: Drink plenty of water. After receiving IV contrast, you may experience a mild flushed feeling. This is normal. On occasion, you may experience a mild rash up to 24 hours after the test. This is not dangerous. If this occurs, you can take Benadryl 25 mg and increase your fluid intake. If you experience trouble breathing, this can be serious. If it is severe call 911 IMMEDIATELY. If it is mild, please call our office. If you take any of these medications: Glipizide/Metformin, Avandament, Glucavance, please do not take 48 hours after completing test unless otherwise instructed.  We will call to schedule your test 2-4 weeks out understanding that some insurance companies will need an authorization prior to the  service being performed.   For non-scheduling related questions, please contact the cardiac imaging nurse navigator should you have any questions/concerns: Marchia Bond, Cardiac Imaging Nurse Navigator Gordy Clement, Cardiac Imaging Nurse Navigator [facility] Direct Office Dial: [phone]   For scheduling needs, including cancellations and  rescheduling, please call [place], [phone].   ZIO XT- Long Term Monitor Instructions  Your physician has requested you wear a ZIO patch monitor for 3 days.  This is a single patch monitor. [employer] supplies one patch monitor per enrollment. Additional stickers are not available. Please do not apply patch if you will be having a Nuclear Stress Test,  Echocardiogram, Cardiac CT, MRI, or Chest Xray during the period you would be wearing the  monitor. The patch cannot be worn during these tests. You cannot remove and re-apply the  ZIO XT patch monitor.  Your ZIO patch monitor will be mailed 3 day USPS to your address on file. It may take 3-5 days  to receive your monitor after you have been enrolled.  Once you have received your monitor, please review the enclosed instructions. Your monitor  has already been registered assigning a specific monitor serial # to you.  Billing and Patient Assistance Program Information  We have supplied [employer] with any of your insurance information on file for billing purposes. [employer] offers a sliding scale Patient Assistance Program for patients that do not have  insurance, or whose insurance does not completely cover the cost of the ZIO monitor.  You must apply for the Patient Assistance Program to qualify for this discounted rate.  To apply, please call [employer] at [phone], select option 4, select option 2, ask to apply for  Patient Assistance Program. Theodore Demark will ask your household income, and how many people  are in your household. They will quote your out-of-pocket cost based on that information.  [employer] will also be able to set up a 23-month interest-free payment plan if needed.  Applying the monitor   Shave hair from upper left chest.  Hold abrader disc by orange tab. Rub abrader in 40 strokes over the upper left chest as  indicated in your monitor instructions.  Clean area with 4 enclosed alcohol pads. Let dry.  Apply patch as  indicated in monitor instructions. Patch will be placed under collarbone on left  side of chest with arrow pointing upward.  Rub patch adhesive wings for 2 minutes. Remove white label marked "1". Remove the white  label marked "2". Rub patch adhesive wings for 2 additional minutes.  While looking in a mirror, press and release button in center of patch. A small green light will  flash 3-4 times. This will be your only indicator that the monitor has been turned on.  Do not shower for the first 24 hours. You may shower after the first 24 hours.  Press the button if you feel a symptom. You will hear a small click. Record Date, Time and  Symptom in the Patient Logbook.  When you are ready to remove the patch, follow instructions on the last 2 pages of Patient  Logbook. Stick patch monitor onto the last page of Patient Logbook.  Place Patient Logbook in the blue and white box. Use locking tab on box and tape box closed  securely. The blue and white box has prepaid postage on it. Please place it in the mailbox as  soon as possible. Your physician should have your test results approximately 7 days after the  monitor  has been mailed back to [place].  Call [place] at [phone] if you have questions regarding  your ZIO XT patch monitor. Call them immediately if you see an orange light blinking on your  monitor.  If your monitor falls off in less than 4 days, contact our Monitor department at [phone].  If your monitor becomes loose or falls off after 4 days call [employer] at [phone] for  suggestions on securing your monitor    Follow-Up: At [facility], you and your health needs are our priority.  As part of our continuing mission to provide you with exceptional heart care, we have created designated Provider Care Teams.  These Care Teams include your primary Cardiologist (physician) and Advanced Practice Providers (APPs -  Physician Assistants and  Nurse Practitioners) who all work together to provide you with the care you need, when you need it.  We recommend signing up for the patient portal called "MyChart".  Sign up information is provided on this After Visit Summary.  MyChart is used to connect with patients for Virtual Visits (Telemedicine).  Patients are able to view lab/test results, encounter notes, upcoming appointments, etc.  Non-urgent messages can be sent to your provider as well.   To learn more about what you can do with MyChart, go to [email or web address].    Your next appointment:   6 month(s)  The format for your next appointment:   In Person  Provider:   Robbie Lis, PA-C, Nicholes Rough, PA-C, Melina Copa, PA-C, Ambrose Pancoast, NP, Cecilie Kicks, NP, Ermalinda Barrios, PA-C, Christen Bame, NP, or Richardson Dopp, PA-C

## 2022-03-30 NOTE — Progress Notes (Signed)
Cardiology Office Note:    Date:  03/30/2022   ID:  Amanda Garrett, DOB 1955/09/28, MRN [id number]  PCP:  Leonard Downing, MD   [place] Providers Cardiologist:  None   Referring MD: Ferd Hibbs, NP    History of Present Illness:    Amanda Garrett is a 66 y.o. female with a hx of DMII, COPD, HTN, and HLD who was referred by Ferd Hibbs for further evaluation of elevated heart rates and blood pressure.  Patient seen by Ferd Hibbs, NP on 02/17/22. Note reviewed. Was having both elevated blood pressures and heart rates at pulmonary rehab. She was started on losartan and referred to Cardiology for further evaluation.  Today, the patient states that she is continuing to recover from her multiple hospitalizations with bronchitis. She is now on chronic 2L [place]. She has been enrolled in pulmonary rehab where she was noted to have elevated blood pressures with exertion as detailed above. Since her losartan was started, her blood pressures are averaging 109-323 systolic.   Other than elevated blood pressure, the patient states she also has frequent palpitations. She is currently taking propranolol but has breakthrough symptoms. No associated lightheadedness, dizziness, chest pain or SOB. States she can have several episodes in a day.   She does report occasional chest tightness with exertion as well as chronic dyspnea on exertion. Has not had recent cardiac testing. Notably, has LAD Ca on CT chest.   She denies any peripheral edema. No headaches, syncope, orthopnea, or PND.   Past Medical History:  Diagnosis Date   Asthma    COPD (chronic obstructive pulmonary disease) ([place])    Diabetes mellitus without complication (HCC)    Emphysema of lung ([place])    GERD (gastroesophageal reflux disease)    Hypercholesteremia 2011   Hypertension    Lichen sclerosus et atrophicus    vulva   Oxygen deficiency    Palpitation    on Inderal    Past Surgical History:   Procedure Laterality Date   BIOPSY  03/01/2021   Procedure: BIOPSY;  Surgeon: Yetta Flock, MD;  Location: [facility];  Service: Gastroenterology;;   [place]     COLONOSCOPY WITH PROPOFOL N/A 03/01/2021   Procedure: COLONOSCOPY WITH PROPOFOL;  Surgeon: Yetta Flock, MD;  Location: [facility];  Service: Gastroenterology;  Laterality: N/A;   DILATION AND CURETTAGE, DIAGNOSTIC / THERAPEUTIC  05/2010   hysteroscopy, polypectomy   ESOPHAGOGASTRODUODENOSCOPY (EGD) WITH PROPOFOL N/A 03/01/2021   Procedure: ESOPHAGOGASTRODUODENOSCOPY (EGD) WITH PROPOFOL;  Surgeon: Yetta Flock, MD;  Location: [facility];  Service: Gastroenterology;  Laterality: N/A;   HEMOSTASIS CLIP PLACEMENT  03/01/2021   Procedure: HEMOSTASIS CLIP PLACEMENT;  Surgeon: Yetta Flock, MD;  Location: [facility];  Service: Gastroenterology;;   KNEE ARTHROSCOPY Right 2013   POLYPECTOMY  03/01/2021   Procedure: POLYPECTOMY;  Surgeon: Yetta Flock, MD;  Location: [facility];  Service: Gastroenterology;;   [place]   dr Pearline Cables    Current Medications: Current Meds  Medication Sig   albuterol (PROVENTIL) (2.5 MG/3ML) 0.083% nebulizer solution Take 2.5 mg by nebulization every 6 (six) hours as needed for wheezing or shortness of breath.   atorvastatin (LIPITOR) 10 MG tablet Take 10 mg by mouth daily.   BAYER LOW DOSE 81 MG EC tablet Take 81 mg by mouth daily. Swallow whole.   Calcium-Magnesium-Zinc (CAL-MAG-ZINC PO) Take  1 tablet by mouth daily.   Cholecalciferol (VITAMIN D-3) 25 MCG (1000 UT) CAPS Take 1,000 Units by mouth daily.   clobetasol ointment (TEMOVATE) 8.29 % Apply 1 application topically daily as needed (in the perineal area).   Cyanocobalamin (B-12) 2500 MCG TABS Take 2,500 mcg by mouth daily.   famotidine (PEPCID) 20 MG tablet Take 1 tablet (20 mg total) by mouth at bedtime. Please schedule an office  visit for further refills. Thank you   fluticasone (FLONASE) 50 MCG/ACT nasal spray Place 2 sprays into both nostrils daily.   Fluticasone-Umeclidin-Vilant (TRELEGY ELLIPTA) 200-62.5-25 MCG/ACT AEPB Inhale 1 puff into the lungs daily.   ivabradine (CORLANOR) 7.5 MG TABS tablet Take 2 tablets (15 mg total) by mouth once for 1 dose. Take 90-120 minutes prior to scan.   levocetirizine (XYZAL) 5 MG tablet TAKE 1 TABLET BY MOUTH ONCE DAILY IN THE EVENING   loratadine (CLARITIN) 10 MG tablet Take 10 mg by mouth daily.   losartan (COZAAR) 50 MG tablet Take 50 mg by mouth daily.   metFORMIN (GLUCOPHAGE) 850 MG tablet Take 850 mg by mouth 2 (two) times daily with a meal.   Misc Natural Products (SAMBUCUS ELDERBERRY IMMUNE) SYRP Take 5 mLs by mouth daily.   montelukast (SINGULAIR) 10 MG tablet Take 10 mg by mouth at bedtime.   Multiple Vitamins-Minerals (AIRBORNE PO) Take 1 tablet by mouth daily.   Multiple Vitamins-Minerals (CENTRUM SILVER ULTRA WOMENS) TABS Take 1 tablet by mouth daily.   OXYGEN Inhale 1 L into the lungs continuous.   Potassium 99 MG TABS Take 99 mg by mouth daily.   propranolol (INDERAL) 10 MG tablet Take 10 mg by mouth 2 (two) times daily.   Sodium Chloride-Sodium Bicarb (AYR SALINE NASAL RINSE NA) Place 1 spray into the nose daily as needed (sinus rinse).   triamcinolone (KENALOG) 0.025 % cream Apply 1 application topically 2 (two) times daily as needed (rash).   VENTOLIN HFA 108 (90 Base) MCG/ACT inhaler Inhale 2 puffs into the lungs every 6 (six) hours as needed for wheezing or shortness of breath.   vitamin C (ASCORBIC ACID) 500 MG tablet Take 500 mg by mouth daily.     Allergies:   Spiriva respimat [tiotropium bromide monohydrate], Latex, and Nickel   Social History   Socioeconomic History   Marital status: Married    Spouse name: Not on file   Number of children: Not on file   Years of education: 11   Highest education level: 11th grade  Occupational History   Not on  file  Tobacco Use   Smoking status: Former    Packs/day: 2.00    Years: 34.00    Total pack years: 68.00    Types: Cigarettes    Start date: 42    Quit date: 06/1996    Years since quitting: 25.8   Smokeless tobacco: Never  Vaping Use   Vaping Use: Never used  Substance and Sexual Activity   Alcohol use: Yes    Comment: rare   Drug use: Never   Sexual activity: Not on file  Other Topics Concern   Not on file  Social History Narrative   Not on file   Social Determinants of Health   Financial Resource Strain: Not on file  Food Insecurity: Not on file  Transportation Needs: Not on file  Physical Activity: Not on file  Stress: Not on file  Social Connections: Not on file     Family History: The patient's family history  includes Breast cancer in her paternal grandmother; Colon polyps in her mother; Diabetes in her father, maternal grandfather, and mother; Diverticulitis in her mother; Hypertension in her maternal grandfather; Ulcerative colitis in her daughter. There is no history of COPD, Lung cancer, Colon cancer, Esophageal cancer, Pancreatic cancer, Stomach cancer, or Rectal cancer.  ROS:   Review of Systems  Constitutional:  Negative for chills and fever.  HENT:  Negative for nosebleeds and tinnitus.   Eyes:  Negative for blurred vision and pain.  Respiratory:  Positive for shortness of breath. Negative for cough, hemoptysis and stridor.   Cardiovascular:  Positive for chest pain (Tightness) and palpitations. Negative for orthopnea, claudication, leg swelling and PND.  Gastrointestinal:  Negative for blood in stool, diarrhea, nausea and vomiting.  Genitourinary:  Negative for dysuria and hematuria.  Musculoskeletal:  Negative for falls.  Neurological:  Negative for dizziness, loss of consciousness and headaches.  Psychiatric/Behavioral:  Negative for depression, hallucinations and substance abuse. The patient does not have insomnia.      EKGs/Labs/Other Studies  Reviewed:    The following studies were reviewed today:  CT chest 02/23/22: FINDINGS: Cardiovascular: Aortic atherosclerosis. Normal heart size. Left coronary artery calcifications. No pericardial effusion.   Mediastinum/Nodes: No enlarged mediastinal, hilar, or axillary lymph nodes. Thyroid gland, trachea, and esophagus demonstrate no significant findings.   Lungs/Pleura: Moderate centrilobular emphysema. Diffuse bilateral bronchial wall thickening. Bandlike scarring of the medial segment right middle lobe, lingula, and left lower lobe. Unchanged 0.6 cm nodule of the lateral segment right middle lobe, stable and benign (series 5, image 107). New, clustered nodular opacities of the peripheral right upper lobe, measuring in total 1.4 x 1.2 cm (series 5, image 51). No pleural effusion or pneumothorax.   Upper Abdomen: No acute abnormality.  Status post cholecystectomy.   Musculoskeletal: No chest wall abnormality. No acute osseous findings.   IMPRESSION: 1. New, clustered nodular opacities of the peripheral right upper lobe, measuring in total 1.4 x 1.2 cm. These are almost certainly infectious or inflammatory given interval development, however follow-up CT in 3 months is recommended to assess for stability or resolution. 2. Unchanged 0.6 cm nodule of the lateral segment right middle lobe, stable and benign. No specific further follow-up or characterization is required. 3. Consider ongoing annual low-dose CT lung cancer screening if indicated by patient age, smoking history, and/or other risk factors for lung cancer. 4. Emphysema. 5. Coronary artery disease.   Aortic Atherosclerosis (ICD10-I70.0) and Emphysema (ICD10-J43.9).  EKG:  EKG is personally reviewed. 03/30/2022:  Sinus rhythm with PVC. Rate 71 bpm.   Recent Labs: 08/05/2021: ALT 22; B Natriuretic Peptide 96.9 08/08/2021: BUN 24; Creatinine, Ser 0.86; Potassium 3.9; Sodium 137 08/23/2021: Hemoglobin 10.5;  Platelets 314.0   Recent Lipid Panel No results found for: "CHOL", "TRIG", "HDL", "CHOLHDL", "VLDL", "LDLCALC", "LDLDIRECT"   Risk Assessment/Calculations:                Physical Exam:    VS:  BP 136/72   Pulse 71   Ht '5\' 1"'$  (1.549 m)   Wt 194 lb (88 kg)   SpO2 97%   BMI 36.66 kg/m     Wt Readings from Last 3 Encounters:  03/30/22 194 lb (88 kg)  03/29/22 194 lb 3.6 oz (88.1 kg)  03/15/22 194 lb 3.6 oz (88.1 kg)     GEN: Well nourished, well developed in no acute distress HEENT: Normal NECK: No JVD; No carotid bruits CARDIAC: RRR, no murmurs, rubs, gallops RESPIRATORY:  Diminished  breath sounds, otherwise clear to auscultation without rales, wheezing or rhonchi. On oxygen.  ABDOMEN: Soft, non-tender, non-distended MUSCULOSKELETAL:  No edema; No deformity  SKIN: Warm and dry NEUROLOGIC:  Alert and oriented x 3 PSYCHIATRIC:  Normal affect   ASSESSMENT:    1. Precordial pain   2. Palpitations   3. Hypercholesterolemia   4. Medication management   5. Chronic obstructive pulmonary disease, unspecified COPD type ([place])   6. Coronary artery calcification   7. Aortic atherosclerosis ([place])   8. Pure hypercholesterolemia    PLAN:    In order of problems listed above:  #Chest Tightness with Exertion: #LAD Ca on CT chest: Patient reports intermittent chest tightness with exertion that improves with rest. Has chronic DOE as well in the setting of COPD. Given risk factors and known LAD Ca, will check coronary CTA and TTE for further evaluation. -Check coronary CTA -Check TTE -Continue lipitor '10mg'$  daily -Continue ASA '81mg'$  daily  #Palpitations: Reported history of intermittent palpitations that can occur several times per day. ECG today with NSR with 1 PVC. Will check zio monitor to evaluate further.  -Check zio monitor -Check TTE as above -Continue propranolol '10mg'$  BID  #HTN: Notably hypertensive with exercise at Pulmonary rehab. Was initiated on losartan by  PCP with significant improvement. Currently, running with BP 110-130s. -Continue losartan '50mg'$  daily -Continue propranolol '10mg'$  BID  #HLD: #Aortic atherosclerosis: -Continue lipitor '10mg'$  daily -Goal LDL<70  #Severe COPD: -Follows with Pulm        Follow-up:  6 months.  Medication Adjustments/Labs and Tests Ordered: Current medicines are reviewed at length with the patient today.  Concerns regarding medicines are outlined above.   Orders Placed This Encounter  Procedures   CT CORONARY MORPH W/CTA COR W/SCORE W/CA W/CM &/OR WO/CM   Basic metabolic panel   Lipid Profile   LONG TERM MONITOR (3-14 DAYS)   EKG 12-Lead   ECHOCARDIOGRAM COMPLETE   Meds ordered this encounter  Medications   ivabradine (CORLANOR) 7.5 MG TABS tablet    Sig: Take 2 tablets (15 mg total) by mouth once for 1 dose. Take 90-120 minutes prior to scan.    Dispense:  2 tablet    Refill:  0    Use cash pricing for corlanor procedure   Patient Instructions  Medication Instructions:   Your physician recommends that you continue on your current medications as directed. Please refer to the Current Medication list given to you today.  *If you need a refill on your cardiac medications before your next appointment, please call your pharmacy*   Lab Work:  THIS FRIDAY 04/01/22--CHECK BMET AND LIPIDS--COME FASTING  If you have labs (blood work) drawn today and your tests are completely normal, you will receive your results only by: [place] (if you have MyChart) OR A paper copy in the mail If you have any lab test that is abnormal or we need to change your treatment, we will call you to review the results.   Testing/Procedures:  Your physician has requested that you have an echocardiogram. Echocardiography is a painless test that uses sound waves to create images of your heart. It provides your doctor with information about the size and shape of your heart and how well your heart's chambers and  valves are working. This procedure takes approximately one hour. There are no restrictions for this procedure. Please do NOT wear cologne, perfume, aftershave, or lotions (deodorant is allowed). Please arrive 15 minutes prior to your appointment time.  Your cardiac CT will be scheduled at one of the below locations:   [facility] [address] [place], [place] [address] [phone]   If scheduled at [facility], please arrive at the [facility] En[facility] (Entrance C2) of [facility] 30 minutes prior to test start time. You can use the FREE valet parking offered at entrance C (encouraged to control the heart rate for the test)  Proceed to the [facility] Radiology Department (first floor) to check-in and test prep.  All radiology patients and guests should use entrance C2 at [facility], accessed from [facility], even though the hospital's physical address listed is [address].      Please follow these instructions carefully (unless otherwise directed):   On the Night Before the Test: Be sure to Drink plenty of water. Do not consume any caffeinated/decaffeinated beverages or chocolate 12 hours prior to your test. Do not take any antihistamines (CLARITIN AND XYZAL) 12 hours prior to your test.   On the Day of the Test: Drink plenty of water until 1 hour prior to the test. Do not eat any food 1 hour prior to test. You may take your regular medications prior to the test.  Take IVABRADINE (CORLANOR) 15 MG BY MOUTH two hours prior to test.  DO NOT TAKE YOUR PROPRANOLOL THE MORNING OF THIS TEST--THIS MEDICATION WAS SENT TO [facility] FOR YOU TO PICK UP FEMALES- please wear underwire-free bra if available, avoid dresses & tight clothing       After the Test: Drink plenty of water. After receiving IV contrast, you may experience a mild flushed feeling. This is normal. On occasion, you may  experience a mild rash up to 24 hours after the test. This is not dangerous. If this occurs, you can take Benadryl 25 mg and increase your fluid intake. If you experience trouble breathing, this can be serious. If it is severe call 911 IMMEDIATELY. If it is mild, please call our office. If you take any of these medications: Glipizide/Metformin, Avandament, Glucavance, please do not take 48 hours after completing test unless otherwise instructed.  We will call to schedule your test 2-4 weeks out understanding that some insurance companies will need an authorization prior to the service being performed.   For non-scheduling related questions, please contact the cardiac imaging nurse navigator should you have any questions/concerns: Marchia Bond, Cardiac Imaging Nurse Navigator Gordy Clement, Cardiac Imaging Nurse Navigator [facility] Direct Office Dial: [phone]   For scheduling needs, including cancellations and rescheduling, please call [place], [phone].   ZIO XT- Long Term Monitor Instructions  Your physician has requested you wear a ZIO patch monitor for 3 days.  This is a single patch monitor. [employer] supplies one patch monitor per enrollment. Additional stickers are not available. Please do not apply patch if you will be having a Nuclear Stress Test,  Echocardiogram, Cardiac CT, MRI, or Chest Xray during the period you would be wearing the  monitor. The patch cannot be worn during these tests. You cannot remove and re-apply the  ZIO XT patch monitor.  Your ZIO patch monitor will be mailed 3 day USPS to your address on file. It may take 3-5 days  to receive your monitor after you have been enrolled.  Once you have received your monitor, please review the enclosed instructions. Your monitor  has already been registered assigning a specific monitor serial # to you.  Billing and Patient Assistance Program Information  We have supplied [employer] with  any of your insurance information on file for billing purposes. [employer] offers a sliding scale Patient Assistance Program for patients that do not have  insurance, or whose insurance does not completely cover the cost of the ZIO monitor.  You must apply for the Patient Assistance Program to qualify for this discounted rate.  To apply, please call [employer] at [phone], select option 4, select option 2, ask to apply for  Patient Assistance Program. Theodore Demark will ask your household income, and how many people  are in your household. They will quote your out-of-pocket cost based on that information.  [employer] will also be able to set up a 64-month interest-free payment plan if needed.  Applying the monitor   Shave hair from upper left chest.  Hold abrader disc by orange tab. Rub abrader in 40 strokes over the upper left chest as  indicated in your monitor instructions.  Clean area with 4 enclosed alcohol pads. Let dry.  Apply patch as indicated in monitor instructions. Patch will be placed under collarbone on left  side of chest with arrow pointing upward.  Rub patch adhesive wings for 2 minutes. Remove white label marked "1". Remove the white  label marked "2". Rub patch adhesive wings for 2 additional minutes.  While looking in a mirror, press and release button in center of patch. A small green light will  flash 3-4 times. This will be your only indicator that the monitor has been turned on.  Do not shower for the first 24 hours. You may shower after the first 24 hours.  Press the button if you feel a symptom. You will hear a small click. Record Date, Time and  Symptom in the Patient Logbook.  When you are ready to remove the patch, follow instructions on the last 2 pages of Patient  Logbook. Stick patch monitor onto the last page of Patient Logbook.  Place Patient Logbook in the blue and white box. Use locking tab on box and tape box closed  securely. The blue and white box has prepaid  postage on it. Please place it in the mailbox as  soon as possible. Your physician should have your test results approximately 7 days after the  monitor [facility]  Call [place] [phone] you have questions regarding  your ZIO XT patch monitor. Call them immediately if you see an orange light blinking on your  monitor.  If your monitor falls off in less than 4 days, contact our Monitor department at [phone]  If your monitor becomes loose or falls off after 4 days call [employer] at [phone]  suggestions on securing your monitor    Follow-Up: [facility] and your health needs are our priority.  As part of our continuing mission to provide you with exceptional heart care, we have created designated Provider Care Teams.  These Care Teams include your primary Cardiologist (physician) and Advanced Practice Providers (APPs -  Physician Assistants and Nurse Practitioners) who all work together to provide you with the care you need, when you need it.  We recommend signing up for the patient portal called "MyChart".  Sign up information is provided on this After Visit Summary.  MyChart is used to connect with patients for Virtual Visits (Telemedicine).  Patients are able to view lab/test results, encounter notes, upcoming appointments, etc.  Non-urgent messages can be sent to  your provider as well.   To learn more about what you can do with MyChart, go to [email or web address].    Your next appointment:   6 month(s)  The format for your next appointment:   In Person  Provider:   Robbie Lis, PA-C, Nicholes Rough, PA-C, Melina Copa, PA-C, Ambrose Pancoast, NP, Cecilie Kicks, NP, Ermalinda Barrios, PA-C, Christen Bame, NP, or Richardson Dopp, PA-C                   I,Mathew Stumpf,acting as a scribe for Freada Bergeron, MD.,have documented all relevant documentation on the behalf of Freada Bergeron, MD,as  directed by  Freada Bergeron, MD while in the presence of Freada Bergeron, MD.  I, Freada Bergeron, MD, have reviewed all documentation for this visit. The documentation on 03/30/22 for the exam, diagnosis, procedures, and orders are all accurate and complete.   Signed, Freada Bergeron, MD  03/30/2022 1:01 PM    [place]

## 2022-03-30 NOTE — Progress Notes (Unsigned)
Enrolled for [employer] to mail a ZIO XT long term holter monitor to the patients address on file.  

## 2022-03-31 ENCOUNTER — Encounter (HOSPITAL_COMMUNITY)
Admission: RE | Admit: 2022-03-31 | Discharge: 2022-03-31 | Disposition: A | Discharge status: Home / Self Care | Payer: Medicare Other | Source: Ambulatory Visit | Attending: Internal Medicine | Admitting: Internal Medicine

## 2022-03-31 DIAGNOSIS — J449 Chronic obstructive pulmonary disease, unspecified: Secondary | ICD-10-CM | POA: Diagnosis not present

## 2022-03-31 NOTE — Progress Notes (Signed)
Daily Session Note  Patient Details  Name: Amanda Garrett MRN: [id number] Date of Birth: 07-14-1955 Referring Provider:   [name] Pulmonary Rehab Walk Test from 01/24/2022 in [place]  Referring Provider [name]       Encounter Date: 03/31/2022  Check In:  Session Check In - 03/31/22 1342       Check-In   Supervising physician immediately available to respond to emergencies [facility] - Physician supervision    Physician(s) Tacy Learn    Location [facility]    Staff Present Rodney Langton, RN;Madi Bonfiglio Yevonne Pax, ACSM-CEP, Exercise Physiologist;Kaylee Rosana Hoes, MS, ACSM-CEP, Exercise Physiologist;Samantha [place], RD, LDN    Virtual Visit No    Medication changes reported     No    Fall or balance concerns reported    No    Tobacco Cessation No Change    Warm-up and Cool-down Performed as group-led instruction    Resistance Training Performed Yes    VAD Patient? No    PAD/SET Patient? No      Pain Assessment   Currently in Pain? No/denies    Multiple Pain Sites No             Capillary Blood Glucose: No results found for this or any previous visit (from the past 24 hour(s)).    Social History   Tobacco Use  Smoking Status Former   Packs/day: 2.00   Years: 34.00   Total pack years: 68.00   Types: Cigarettes   Start date: 58   Quit date: 06/1996   Years since quitting: 25.8  Smokeless Tobacco Never    Goals Met:  Independence with exercise equipment Exercise tolerated well No report of concerns or symptoms today Strength training completed today  Goals Unmet:  Not Applicable  Comments: Service time is from 1311 to 1454.    Dr. Rodman Pickle is Medical Director for Pulmonary Rehab at [facility].

## 2022-04-01 ENCOUNTER — Ambulatory Visit: Payer: Medicare Other | Attending: Cardiology

## 2022-04-01 DIAGNOSIS — R072 Precordial pain: Secondary | ICD-10-CM

## 2022-04-01 DIAGNOSIS — E7800 Pure hypercholesterolemia, unspecified: Secondary | ICD-10-CM

## 2022-04-01 DIAGNOSIS — R002 Palpitations: Secondary | ICD-10-CM

## 2022-04-01 DIAGNOSIS — Z79899 Other long term (current) drug therapy: Secondary | ICD-10-CM

## 2022-04-01 LAB — LIPID PANEL
Chol/HDL Ratio: 4.3 ratio (ref 0.0–4.4)
Cholesterol, Total: 150 mg/dL (ref 100–199)
HDL: 35 mg/dL — ABNORMAL LOW (ref >39)
LDL Chol Calc (NIH): 82 mg/dL (ref 0–99)
Triglycerides: 197 mg/dL — ABNORMAL HIGH (ref 0–149)
VLDL Cholesterol Cal: 33 mg/dL (ref 5–40)

## 2022-04-01 LAB — BASIC METABOLIC PANEL
BUN/Creatinine Ratio: 16 (ref 12–28)
BUN: 12 mg/dL (ref 8–27)
CO2: 26 mmol/L (ref 20–29)
Calcium: 9.6 mg/dL (ref 8.7–10.3)
Chloride: 103 mmol/L (ref 96–106)
Creatinine, Ser: 0.77 mg/dL (ref 0.57–1.00)
Glucose: 107 mg/dL — ABNORMAL HIGH (ref 70–99)
Potassium: 4.4 mmol/L (ref 3.5–5.2)
Sodium: 141 mmol/L (ref 134–144)
eGFR: 84 mL/min/{1.73_m2} (ref >59)

## 2022-04-04 ENCOUNTER — Telehealth: Payer: Self-pay | Admitting: *Deleted

## 2022-04-04 DIAGNOSIS — Z79899 Other long term (current) drug therapy: Secondary | ICD-10-CM

## 2022-04-04 DIAGNOSIS — E7800 Pure hypercholesterolemia, unspecified: Secondary | ICD-10-CM

## 2022-04-04 DIAGNOSIS — I2510 Atherosclerotic heart disease of native coronary artery without angina pectoris: Secondary | ICD-10-CM

## 2022-04-04 MED ORDER — ATORVASTATIN CALCIUM 20 MG PO TABS: 20.0000 mg | ORAL_TABLET | Freq: Every day | ORAL | 1 refills | Status: DC

## 2022-04-04 NOTE — Telephone Encounter (Signed)
-----   Message from Freada Bergeron, MD sent at 04/04/2022  9:07 AM EDT ----- Her LDL is above goal at 82. Is she willing to bump her lipitor to '20mg'$  daily? If so, we can repeat cholesterol in 6-8 weeks to ensure at goal.

## 2022-04-04 NOTE — Telephone Encounter (Signed)
The patient has been notified of the result and verbalized understanding.  All questions (if any) were answered.  Pt agreed to increase her atorvastatin to 20 mg po daily. She will come back into the office for repeat labs to recheck lipids in 8 weeks, on 05/30/22.  She is aware to come fasting to this lab appt.   Confirmed the pharmacy of choice with the pt.   Pt verbalized understanding and agrees with this plan.

## 2022-04-05 ENCOUNTER — Encounter (HOSPITAL_COMMUNITY)
Admission: RE | Admit: 2022-04-05 | Discharge: 2022-04-05 | Disposition: A | Discharge status: Home / Self Care | Payer: Medicare Other | Source: Ambulatory Visit | Attending: Internal Medicine | Admitting: Internal Medicine

## 2022-04-05 DIAGNOSIS — J449 Chronic obstructive pulmonary disease, unspecified: Secondary | ICD-10-CM | POA: Diagnosis not present

## 2022-04-05 NOTE — Progress Notes (Signed)
Daily Session Note  Patient Details  Name: Amanda Garrett MRN: [id number] Date of Birth: 24-Mar-1956 Referring Provider:   [name] Pulmonary Rehab Walk Test from 01/24/2022 in [place]  Referring Provider [name]       Encounter Date: 04/05/2022  Check In:  Session Check In - 04/05/22 1429       Check-In   Supervising physician immediately available to respond to emergencies [facility] - Physician supervision    Physician(s) Gala Murdoch    Location [facility]    Staff Present Rodney Langton, RN;Zackarie Chason Yevonne Pax, ACSM-CEP, Exercise Physiologist;Kaylee Rosana Hoes, MS, ACSM-CEP, Exercise Physiologist;Samantha [place], RD, LDN    Virtual Visit No    Medication changes reported     No    Fall or balance concerns reported    No    Tobacco Cessation No Change    Warm-up and Cool-down Performed as group-led instruction    Resistance Training Performed Yes    VAD Patient? No    PAD/SET Patient? No      Pain Assessment   Currently in Pain? No/denies    Multiple Pain Sites No             Capillary Blood Glucose: No results found for this or any previous visit (from the past 24 hour(s)).    Social History   Tobacco Use  Smoking Status Former   Packs/day: 2.00   Years: 34.00   Total pack years: 68.00   Types: Cigarettes   Start date: 1965   Quit date: 06/1996   Years since quitting: 25.8  Smokeless Tobacco Never    Goals Met:  Independence with exercise equipment Exercise tolerated well No report of concerns or symptoms today Strength training completed today  Goals Unmet:  Not Applicable  Comments: Pt completed 6 min walk test today. Service time is from 1331 to 1450.    Dr. Rodman Pickle is Medical Director for Pulmonary Rehab at [facility].

## 2022-04-06 NOTE — Progress Notes (Signed)
Pulmonary Individual Treatment Plan  Patient Details  Name: Amanda Garrett MRN: [id number] Date of Birth: 09/06/1955 Referring Provider:   [name] Pulmonary Rehab Walk Test from 01/24/2022 in [place]  Referring Provider [name]       Initial Encounter Date:  Flowsheet Row Pulmonary Rehab Walk Test from 01/24/2022 in [place]  Date 01/24/22       Visit Diagnosis: Stage 3 severe COPD by GOLD classification ([place])  Patient's Home Medications on Admission:   Current Outpatient Medications:    albuterol (PROVENTIL) (2.5 MG/3ML) 0.083% nebulizer solution, Take 2.5 mg by nebulization every 6 (six) hours as needed for wheezing or shortness of breath., Disp: , Rfl:    atorvastatin (LIPITOR) 20 MG tablet, Take 1 tablet (20 mg total) by mouth daily., Disp: 90 tablet, Rfl: 1   BAYER LOW DOSE 81 MG EC tablet, Take 81 mg by mouth daily. Swallow whole., Disp: , Rfl:    Calcium-Magnesium-Zinc (CAL-MAG-ZINC PO), Take 1 tablet by mouth daily., Disp: , Rfl:    Cholecalciferol (VITAMIN D-3) 25 MCG (1000 UT) CAPS, Take 1,000 Units by mouth daily., Disp: , Rfl:    clobetasol ointment (TEMOVATE) 9.47 %, Apply 1 application topically daily as needed (in the perineal area)., Disp: , Rfl:    Cyanocobalamin (B-12) 2500 MCG TABS, Take 2,500 mcg by mouth daily., Disp: , Rfl:    famotidine (PEPCID) 20 MG tablet, Take 1 tablet (20 mg total) by mouth at bedtime. Please schedule an office visit for further refills. Thank you, Disp: 90 tablet, Rfl: 0   fluticasone (FLONASE) 50 MCG/ACT nasal spray, Place 2 sprays into both nostrils daily., Disp: 18.2 mL, Rfl: 2   Fluticasone-Umeclidin-Vilant (TRELEGY ELLIPTA) 200-62.5-25 MCG/ACT AEPB, Inhale 1 puff into the lungs daily., Disp: 1 each, Rfl: 0   levocetirizine (XYZAL) 5 MG tablet, TAKE 1 TABLET BY MOUTH ONCE DAILY IN THE EVENING, Disp: 30 tablet, Rfl: 0   loratadine (CLARITIN) 10 MG tablet, Take 10  mg by mouth daily., Disp: , Rfl:    losartan (COZAAR) 50 MG tablet, Take 50 mg by mouth daily., Disp: , Rfl:    metFORMIN (GLUCOPHAGE) 850 MG tablet, Take 850 mg by mouth 2 (two) times daily with a meal., Disp: , Rfl:    Misc Natural Products (SAMBUCUS ELDERBERRY IMMUNE) SYRP, Take 5 mLs by mouth daily., Disp: , Rfl:    montelukast (SINGULAIR) 10 MG tablet, Take 10 mg by mouth at bedtime., Disp: , Rfl:    Multiple Vitamins-Minerals (AIRBORNE PO), Take 1 tablet by mouth daily., Disp: , Rfl:    Multiple Vitamins-Minerals (CENTRUM SILVER ULTRA WOMENS) TABS, Take 1 tablet by mouth daily., Disp: , Rfl:    OXYGEN, Inhale 1 L into the lungs continuous., Disp: , Rfl:    Potassium 99 MG TABS, Take 99 mg by mouth daily., Disp: , Rfl:    propranolol (INDERAL) 10 MG tablet, Take 10 mg by mouth 2 (two) times daily., Disp: , Rfl:    Sodium Chloride-Sodium Bicarb (AYR SALINE NASAL RINSE NA), Place 1 spray into the nose daily as needed (sinus rinse)., Disp: , Rfl:    triamcinolone (KENALOG) 0.025 % cream, Apply 1 application topically 2 (two) times daily as needed (rash)., Disp: , Rfl:    VENTOLIN HFA 108 (90 Base) MCG/ACT inhaler, Inhale 2 puffs into the lungs every 6 (six) hours as needed for wheezing or shortness of breath., Disp: , Rfl:    vitamin C (ASCORBIC  ACID) 500 MG tablet, Take 500 mg by mouth daily., Disp: , Rfl:   Past Medical History: Past Medical History:  Diagnosis Date   Asthma    COPD (chronic obstructive pulmonary disease) ([place])    Diabetes mellitus without complication ([place])    Emphysema of lung ([place])    GERD (gastroesophageal reflux disease)    Hypercholesteremia 2011   Hypertension    Lichen sclerosus et atrophicus    vulva   Oxygen deficiency    Palpitation    on Inderal    Tobacco Use: Social History   Tobacco Use  Smoking Status Former   Packs/day: 2.00   Years: 34.00   Total pack years: 68.00   Types: Cigarettes   Start date: 52   Quit date: 06/1996   Years  since quitting: 25.8  Smokeless Tobacco Never    Labs: Review Flowsheet       Latest Ref Rng & Units 11/09/2020 11/11/2020 08/06/2021 04/01/2022  Labs for ITP Cardiac and Pulmonary Rehab  Cholestrol 100 - 199 mg/dL - - - 150   LDL (calc) 0 - 99 mg/dL - - - 82   HDL-C >39 mg/dL - - - 35   Trlycerides 0 - 149 mg/dL - - - 197   Hemoglobin A1c 4.8 - 5.6 % 6.2  6.2  5.8  -  PH, Arterial 7.350 - 7.450 7.268  7.251  - - -  PCO2 arterial 32.0 - 48.0 mmHg 65.3  76.6  - - -  Bicarbonate 20.0 - 28.0 mmol/L 28.9  32.5  - - -  O2 Saturation % 93.4  90.9  - - -    Capillary Blood Glucose: Lab Results  Component Value Date   GLUCAP 103 (H) 02/03/2022   GLUCAP 93 02/03/2022   GLUCAP 98 02/01/2022   GLUCAP 107 (H) 02/01/2022   GLUCAP 182 (H) 08/08/2021     Pulmonary Assessment Scores:  Pulmonary Assessment Scores     Row Name 01/24/22 1106 03/15/22 1529       ADL UCSD   ADL Phase Entry Exit    SOB Score total 33 58      CAT Score   CAT Score 13 14      mMRC Score   mMRC Score 1 --            UCSD: Self-administered rating of dyspnea associated with activities of daily living (ADLs) 6-point scale (0 = "not at all" to 5 = "maximal or unable to do because of breathlessness")  Scoring Scores range from 0 to 120.  Minimally important difference is 5 units  CAT: CAT can identify the health impairment of COPD patients and is better correlated with disease progression.  CAT has a scoring range of zero to 40. The CAT score is classified into four groups of low (less than 10), medium (10 - 20), high (21-30) and very high (31-40) based on the impact level of disease on health status. A CAT score over 10 suggests significant symptoms.  A worsening CAT score could be explained by an exacerbation, poor medication adherence, poor inhaler technique, or progression of COPD or comorbid conditions.  CAT MCID is 2 points  mMRC: mMRC (Modified Medical Research Council) Dyspnea Scale is used to  assess the degree of baseline functional disability in patients of respiratory disease due to dyspnea. No minimal important difference is established. A decrease in score of 1 point or greater is considered a positive change.   Pulmonary Function Assessment:  Pulmonary  Function Assessment - 01/24/22 1048       Breath   Bilateral Breath Sounds Clear;Decreased    Shortness of Breath Fear of Shortness of Breath;Limiting activity;Panic with Shortness of Breath;Yes             Exercise Target Goals: Exercise Program Goal: Individual exercise prescription set using results from initial 6 min walk test and THRR while considering  patient's activity barriers and safety.   Exercise Prescription Goal: Initial exercise prescription builds to 30-45 minutes a day of aerobic activity, 2-3 days per week.  Home exercise guidelines will be given to patient during program as part of exercise prescription that the participant will acknowledge.  Activity Barriers & Risk Stratification:  Activity Barriers & Cardiac Risk Stratification - 01/24/22 1049       Activity Barriers & Cardiac Risk Stratification   Activity Barriers Arthritis;Deconditioning;Muscular Weakness;Shortness of Breath;History of Falls    Cardiac Risk Stratification Low             6 Minute Walk:  6 Minute Walk     Row Name 01/24/22 1217 04/05/22 1545       6 Minute Walk   Phase Initial Discharge    Distance 1032 feet 1010 feet    Distance % Change -- -2.13 %    Distance Feet Change -- -22 ft    Walk Time 6 minutes 6 minutes    # of Rest Breaks 0 1    MPH 1.95 1.91    METS 2.28 2.65    RPE 13 11    Perceived Dyspnea  3 1    VO2 Peak 7.96 9.26    Symptoms No Yes (comment)    Comments -- weak, had to rest 4:00-5:30    Resting HR 85 bpm 70 bpm    Resting BP 130/72 124/72    Resting Oxygen Saturation  96 % 99 %    Exercise Oxygen Saturation  during 6 min walk 89 % 89 %    Max Ex. HR 114 bpm 123 bpm    Max Ex. BP  150/86 184/80    2 Minute Post BP 134/70 168/80      Interval HR   1 Minute HR 101 104    2 Minute HR 109 115    3 Minute HR 112 120    4 Minute HR 114 123    5 Minute HR 112 105    6 Minute HR 112 108    2 Minute Post HR 84 88    Interval Heart Rate? Yes Yes      Interval Oxygen   Interval Oxygen? Yes Yes    Baseline Oxygen Saturation % 96 % 99 %    1 Minute Oxygen Saturation % 96 % 96 %    1 Minute Liters of Oxygen 2 L 2 L    2 Minute Oxygen Saturation % 92 % 90 %    2 Minute Liters of Oxygen 2 L 2 L    3 Minute Oxygen Saturation % 91 % 89 %    3 Minute Liters of Oxygen 2 L 2 L    4 Minute Oxygen Saturation % 90 % 89 %    4 Minute Liters of Oxygen 2 L 2 L    5 Minute Oxygen Saturation % 90 % 93 %    5 Minute Liters of Oxygen 2 L 2 L    6 Minute Oxygen Saturation % 89 % 96 %    6 Minute  Liters of Oxygen 2 L 2 L    2 Minute Post Oxygen Saturation % 97 % 96 %    2 Minute Post Liters of Oxygen 2 L 2 L             Oxygen Initial Assessment:  Oxygen Initial Assessment - 01/24/22 1047       Home Oxygen   Home Oxygen Device E-Tanks;Home Concentrator    Sleep Oxygen Prescription Continuous    Liters per minute 2    Home Exercise Oxygen Prescription Continuous    Liters per minute 2    Home Resting Oxygen Prescription Continuous    Liters per minute 2    Compliance with Home Oxygen Use Yes      Initial 6 min Walk   Oxygen Used Continuous    Liters per minute 2      Program Oxygen Prescription   Program Oxygen Prescription Continuous    Liters per minute 2      Intervention   Short Term Goals To learn and exhibit compliance with exercise, home and travel O2 prescription;To learn and understand importance of monitoring SPO2 with pulse oximeter and demonstrate accurate use of the pulse oximeter.;To learn and understand importance of maintaining oxygen saturations>88%;To learn and demonstrate proper pursed lip breathing techniques or other breathing techniques. ;To  learn and demonstrate proper use of respiratory medications    Long  Term Goals Exhibits compliance with exercise, home  and travel O2 prescription;Verbalizes importance of monitoring SPO2 with pulse oximeter and return demonstration;Maintenance of O2 saturations>88%;Exhibits proper breathing techniques, such as pursed lip breathing or other method taught during program session;Compliance with respiratory medication;Demonstrates proper use of MDI's             Oxygen Re-Evaluation:  Oxygen Re-Evaluation     Row Name 02/09/22 1017 03/07/22 1001 03/31/22 1553         Program Oxygen Prescription   Program Oxygen Prescription Continuous Continuous Continuous     Liters per minute _0 Home Oxygen   Home Oxygen Device E-Tanks;Home Concentrator E-Tanks;Home Concentrator E-Tanks;Home Concentrator     Sleep Oxygen Prescription Continuous Continuous Continuous     Liters per minute _1 Home Exercise Oxygen Prescription Continuous Continuous Continuous     Liters per minute _2 Home Resting Oxygen Prescription Continuous Continuous Continuous     Liters per minute _3 Compliance with Home Oxygen Use Yes Yes Yes       Goals/Expected Outcomes   Short Term Goals To learn and exhibit compliance with exercise, home and travel O2 prescription;To learn and understand importance of monitoring SPO2 with pulse oximeter and demonstrate accurate use of the pulse oximeter.;To learn and understand importance of maintaining oxygen saturations>88%;To learn and demonstrate proper pursed lip breathing techniques or other breathing techniques. ;To learn and demonstrate proper use of respiratory medications To learn and exhibit compliance with exercise, home and travel O2 prescription;To learn and understand importance of monitoring SPO2 with pulse oximeter and demonstrate accurate use of the pulse oximeter.;To learn and understand importance of maintaining oxygen saturations>88%;To learn  and demonstrate proper pursed lip breathing techniques or other breathing techniques. ;To learn and demonstrate proper use of respiratory medications To learn and exhibit compliance with exercise, home and travel O2 prescription;To learn and understand importance of monitoring SPO2 with pulse oximeter and demonstrate accurate use of the pulse  oximeter.;To learn and understand importance of maintaining oxygen saturations>88%;To learn and demonstrate proper pursed lip breathing techniques or other breathing techniques. ;To learn and demonstrate proper use of respiratory medications     Long  Term Goals Exhibits compliance with exercise, home  and travel O2 prescription;Verbalizes importance of monitoring SPO2 with pulse oximeter and return demonstration;Maintenance of O2 saturations>88%;Exhibits proper breathing techniques, such as pursed lip breathing or other method taught during program session;Compliance with respiratory medication;Demonstrates proper use of MDI's Exhibits compliance with exercise, home  and travel O2 prescription;Verbalizes importance of monitoring SPO2 with pulse oximeter and return demonstration;Maintenance of O2 saturations>88%;Exhibits proper breathing techniques, such as pursed lip breathing or other method taught during program session;Compliance with respiratory medication;Demonstrates proper use of MDI's Exhibits compliance with exercise, home  and travel O2 prescription;Verbalizes importance of monitoring SPO2 with pulse oximeter and return demonstration;Maintenance of O2 saturations>88%;Exhibits proper breathing techniques, such as pursed lip breathing or other method taught during program session;Compliance with respiratory medication;Demonstrates proper use of MDI's     Comments -- no O2 change no O2 change     Goals/Expected Outcomes Compliance and unerstanding of oxygen saturation monitoring and breathing techniques to decrease shortness of breath. Compliance and unerstanding of  oxygen saturation monitoring and breathing techniques to decrease shortness of breath. Compliance and unerstanding of oxygen saturation monitoring and breathing techniques to decrease shortness of breath.              Oxygen Discharge (Final Oxygen Re-Evaluation):  Oxygen Re-Evaluation - 03/31/22 1553       Program Oxygen Prescription   Program Oxygen Prescription Continuous    Liters per minute 2      Home Oxygen   Home Oxygen Device E-Tanks;Home Concentrator    Sleep Oxygen Prescription Continuous    Liters per minute 2    Home Exercise Oxygen Prescription Continuous    Liters per minute 2    Home Resting Oxygen Prescription Continuous    Liters per minute 2    Compliance with Home Oxygen Use Yes      Goals/Expected Outcomes   Short Term Goals To learn and exhibit compliance with exercise, home and travel O2 prescription;To learn and understand importance of monitoring SPO2 with pulse oximeter and demonstrate accurate use of the pulse oximeter.;To learn and understand importance of maintaining oxygen saturations>88%;To learn and demonstrate proper pursed lip breathing techniques or other breathing techniques. ;To learn and demonstrate proper use of respiratory medications    Long  Term Goals Exhibits compliance with exercise, home  and travel O2 prescription;Verbalizes importance of monitoring SPO2 with pulse oximeter and return demonstration;Maintenance of O2 saturations>88%;Exhibits proper breathing techniques, such as pursed lip breathing or other method taught during program session;Compliance with respiratory medication;Demonstrates proper use of MDI's    Comments no O2 change    Goals/Expected Outcomes Compliance and unerstanding of oxygen saturation monitoring and breathing techniques to decrease shortness of breath.             Initial Exercise Prescription:  Initial Exercise Prescription - 01/24/22 1200       Date of Initial Exercise RX and Referring Provider    Date 01/24/22    Referring Provider [name]    Expected Discharge Date 03/31/22      Oxygen   Oxygen Continuous    Liters 2    Maintain Oxygen Saturation 88% or higher      Recumbant Bike   Level --    Watts --    Minutes --  NuStep   Level 1    SPM 60    Minutes 15      Arm Ergometer   Level 1    Watts 5    Minutes 15      Prescription Details   Frequency (times per week) 2    Duration Progress to 30 minutes of continuous aerobic without signs/symptoms of physical distress      Intensity   THRR 40-80% of Max Heartrate 61-122    Ratings of Perceived Exertion 11-13    Perceived Dyspnea 0-4      Progression   Progression Continue to progress workloads to maintain intensity without signs/symptoms of physical distress.      Resistance Training   Training Prescription Yes    Weight red bands    Reps 10-15             Perform Capillary Blood Glucose checks as needed.  Exercise Prescription Changes:   Exercise Prescription Changes     Row Name 02/01/22 1500 02/15/22 1500 02/22/22 1500 02/22/22 1509 03/15/22 1500     Response to Exercise   Blood Pressure (Admit) 132/76 160/82 -- 150/84 130/60   Blood Pressure (Exercise) 126/70 180/90 -- -- 142/70   Blood Pressure (Exit) 130/76 164/72 -- 128/84 132/66   Heart Rate (Admit) 70 bpm 89 bpm -- 86 bpm 71 bpm   Heart Rate (Exercise) 96 bpm 106 bpm -- 105 bpm 97 bpm   Heart Rate (Exit) 87 bpm 97 bpm -- 82 bpm 83 bpm   Oxygen Saturation (Admit) 97 % 96 % -- 97 % 99 %   Oxygen Saturation (Exercise) 96 % 93 % -- 92 % 96 %   Oxygen Saturation (Exit) 93 % 96 % -- 96 % 96 %   Rating of Perceived Exertion (Exercise) 13 13 -- 13 10.5   Perceived Dyspnea (Exercise) 1 1 -- 2 1   Duration Progress to 30 minutes of  aerobic without signs/symptoms of physical distress Continue with 30 min of aerobic exercise without signs/symptoms of physical distress. -- Continue with 30 min of aerobic exercise without signs/symptoms of  physical distress. Continue with 30 min of aerobic exercise without signs/symptoms of physical distress.   Intensity THRR unchanged THRR unchanged -- THRR unchanged THRR unchanged     Progression   Progression -- -- -- Continue to progress workloads to maintain intensity without signs/symptoms of physical distress. Continue to progress workloads to maintain intensity without signs/symptoms of physical distress.     Resistance Training   Training Prescription Yes Yes -- Yes Yes   Weight red bands red bands -- blue bands blue bands   Reps 10-15 10-15 -- 10-15 10-15   Time 10 Minutes 10 Minutes -- 10 Minutes 10 Minutes     Oxygen   Oxygen Continuous Continuous -- Continuous Continuous   Liters 2 2 -- 2 2     NuStep   Level 1 4 -- 5 5   SPM 60 80 -- 80 80   Minutes 15 15 -- 15 15   METs 1.6 2 -- 2.3 2.4     Arm Ergometer   Level 1 4 -- 4 4   Minutes 15 15 -- 15 15   METs 1.6 2.8 -- 3.9 2.9     Home Exercise Plan   Plans to continue exercise at -- -- Home (comment)  walking -- --   Frequency -- -- Add 1 additional day to program exercise sessions. -- --   Initial  Home Exercises Provided -- -- 02/22/22 -- --     Oxygen   Maintain Oxygen Saturation 88% or higher -- -- 88% or higher 88% or higher    Row Name 03/29/22 1500             Response to Exercise   Blood Pressure (Admit) 120/80       Blood Pressure (Exercise) 164/86       Blood Pressure (Exit) 114/70       Heart Rate (Admit) 73 bpm       Heart Rate (Exercise) 95 bpm       Heart Rate (Exit) 82 bpm       Oxygen Saturation (Admit) 98 %       Oxygen Saturation (Exercise) 93 %       Oxygen Saturation (Exit) 96 %       Rating of Perceived Exertion (Exercise) 11       Perceived Dyspnea (Exercise) 1       Duration Continue with 30 min of aerobic exercise without signs/symptoms of physical distress.       Intensity THRR unchanged         Progression   Progression Continue to progress workloads to maintain intensity  without signs/symptoms of physical distress.         Resistance Training   Training Prescription Yes       Weight blue bands       Reps 10-15       Time 10 Minutes         Oxygen   Oxygen Continuous       Liters 2         NuStep   Level 6       SPM 80       Minutes 15       METs 2.6         Arm Ergometer   Level 4       Minutes 15       METs 2.9         Oxygen   Maintain Oxygen Saturation 88% or higher                Exercise Comments:   Exercise Comments     Row Name 02/22/22 1522           Exercise Comments Completed home exercise plan today. Pt is currently not exercising at home. Discussed pt starting to walk outside or on her treadmill, building up to 30 minutes. Pt has a track near her house. Encouraged adding 1-2 nonrehab days. Pt agreeable.                Exercise Goals and Review:   Exercise Goals     Row Name 01/24/22 1049 02/09/22 1008 03/07/22 0958 03/31/22 1549       Exercise Goals   Increase Physical Activity Yes Yes Yes Yes    Intervention Provide advice, education, support and counseling about physical activity/exercise needs.;Develop an individualized exercise prescription for aerobic and resistive training based on initial evaluation findings, risk stratification, comorbidities and participant's personal goals. Provide advice, education, support and counseling about physical activity/exercise needs.;Develop an individualized exercise prescription for aerobic and resistive training based on initial evaluation findings, risk stratification, comorbidities and participant's personal goals. Provide advice, education, support and counseling about physical activity/exercise needs.;Develop an individualized exercise prescription for aerobic and resistive training based on initial evaluation findings, risk stratification, comorbidities and participant's personal goals. Provide advice, education,  support and counseling about physical activity/exercise  needs.;Develop an individualized exercise prescription for aerobic and resistive training based on initial evaluation findings, risk stratification, comorbidities and participant's personal goals.    Expected Outcomes Short Term: Attend rehab on a regular basis to increase amount of physical activity.;Long Term: Add in home exercise to make exercise part of routine and to increase amount of physical activity.;Long Term: Exercising regularly at least 3-5 days a week. Short Term: Attend rehab on a regular basis to increase amount of physical activity.;Long Term: Add in home exercise to make exercise part of routine and to increase amount of physical activity.;Long Term: Exercising regularly at least 3-5 days a week. Short Term: Attend rehab on a regular basis to increase amount of physical activity.;Long Term: Add in home exercise to make exercise part of routine and to increase amount of physical activity.;Long Term: Exercising regularly at least 3-5 days a week. Short Term: Attend rehab on a regular basis to increase amount of physical activity.;Long Term: Add in home exercise to make exercise part of routine and to increase amount of physical activity.;Long Term: Exercising regularly at least 3-5 days a week.    Increase Strength and Stamina Yes Yes Yes Yes    Intervention Provide advice, education, support and counseling about physical activity/exercise needs.;Develop an individualized exercise prescription for aerobic and resistive training based on initial evaluation findings, risk stratification, comorbidities and participant's personal goals. Provide advice, education, support and counseling about physical activity/exercise needs.;Develop an individualized exercise prescription for aerobic and resistive training based on initial evaluation findings, risk stratification, comorbidities and participant's personal goals. Provide advice, education, support and counseling about physical activity/exercise  needs.;Develop an individualized exercise prescription for aerobic and resistive training based on initial evaluation findings, risk stratification, comorbidities and participant's personal goals. Provide advice, education, support and counseling about physical activity/exercise needs.;Develop an individualized exercise prescription for aerobic and resistive training based on initial evaluation findings, risk stratification, comorbidities and participant's personal goals.    Expected Outcomes Short Term: Perform resistance training exercises routinely during rehab and add in resistance training at home;Short Term: Increase workloads from initial exercise prescription for resistance, speed, and METs.;Long Term: Improve cardiorespiratory fitness, muscular endurance and strength as measured by increased METs and functional capacity (6MWT) Short Term: Perform resistance training exercises routinely during rehab and add in resistance training at home;Short Term: Increase workloads from initial exercise prescription for resistance, speed, and METs.;Long Term: Improve cardiorespiratory fitness, muscular endurance and strength as measured by increased METs and functional capacity (6MWT) Short Term: Perform resistance training exercises routinely during rehab and add in resistance training at home;Short Term: Increase workloads from initial exercise prescription for resistance, speed, and METs.;Long Term: Improve cardiorespiratory fitness, muscular endurance and strength as measured by increased METs and functional capacity (6MWT) Short Term: Perform resistance training exercises routinely during rehab and add in resistance training at home;Short Term: Increase workloads from initial exercise prescription for resistance, speed, and METs.;Long Term: Improve cardiorespiratory fitness, muscular endurance and strength as measured by increased METs and functional capacity (6MWT)    Able to understand and use rate of perceived  exertion (RPE) scale Yes Yes Yes Yes    Intervention Provide education and explanation on how to use RPE scale Provide education and explanation on how to use RPE scale Provide education and explanation on how to use RPE scale Provide education and explanation on how to use RPE scale    Expected Outcomes Short Term: Able to use RPE daily in rehab  to express subjective intensity level;Long Term:  Able to use RPE to guide intensity level when exercising independently Short Term: Able to use RPE daily in rehab to express subjective intensity level;Long Term:  Able to use RPE to guide intensity level when exercising independently Short Term: Able to use RPE daily in rehab to express subjective intensity level;Long Term:  Able to use RPE to guide intensity level when exercising independently Short Term: Able to use RPE daily in rehab to express subjective intensity level;Long Term:  Able to use RPE to guide intensity level when exercising independently    Able to understand and use Dyspnea scale Yes Yes Yes Yes    Intervention Provide education and explanation on how to use Dyspnea scale Provide education and explanation on how to use Dyspnea scale Provide education and explanation on how to use Dyspnea scale Provide education and explanation on how to use Dyspnea scale    Expected Outcomes Short Term: Able to use Dyspnea scale daily in rehab to express subjective sense of shortness of breath during exertion;Long Term: Able to use Dyspnea scale to guide intensity level when exercising independently Short Term: Able to use Dyspnea scale daily in rehab to express subjective sense of shortness of breath during exertion;Long Term: Able to use Dyspnea scale to guide intensity level when exercising independently Short Term: Able to use Dyspnea scale daily in rehab to express subjective sense of shortness of breath during exertion;Long Term: Able to use Dyspnea scale to guide intensity level when exercising independently  Short Term: Able to use Dyspnea scale daily in rehab to express subjective sense of shortness of breath during exertion;Long Term: Able to use Dyspnea scale to guide intensity level when exercising independently    Knowledge and understanding of Target Heart Rate Range (THRR) Yes Yes Yes Yes    Intervention Provide education and explanation of THRR including how the numbers were predicted and where they are located for reference Provide education and explanation of THRR including how the numbers were predicted and where they are located for reference Provide education and explanation of THRR including how the numbers were predicted and where they are located for reference Provide education and explanation of THRR including how the numbers were predicted and where they are located for reference    Expected Outcomes Short Term: Able to state/look up THRR;Long Term: Able to use THRR to govern intensity when exercising independently;Short Term: Able to use daily as guideline for intensity in rehab Short Term: Able to state/look up THRR;Long Term: Able to use THRR to govern intensity when exercising independently;Short Term: Able to use daily as guideline for intensity in rehab Short Term: Able to state/look up THRR;Long Term: Able to use THRR to govern intensity when exercising independently;Short Term: Able to use daily as guideline for intensity in rehab Short Term: Able to state/look up THRR;Long Term: Able to use THRR to govern intensity when exercising independently;Short Term: Able to use daily as guideline for intensity in rehab    Understanding of Exercise Prescription Yes Yes Yes Yes    Intervention Provide education, explanation, and written materials on patient's individual exercise prescription Provide education, explanation, and written materials on patient's individual exercise prescription Provide education, explanation, and written materials on patient's individual exercise prescription Provide  education, explanation, and written materials on patient's individual exercise prescription    Expected Outcomes Short Term: Able to explain program exercise prescription;Long Term: Able to explain home exercise prescription to exercise independently Short Term: Able to explain  program exercise prescription;Long Term: Able to explain home exercise prescription to exercise independently Short Term: Able to explain program exercise prescription;Long Term: Able to explain home exercise prescription to exercise independently Short Term: Able to explain program exercise prescription;Long Term: Able to explain home exercise prescription to exercise independently             Exercise Goals Re-Evaluation :  Exercise Goals Re-Evaluation     [place] Name 02/09/22 1013 03/07/22 0958 03/31/22 1549         Exercise Goal Re-Evaluation   Exercise Goals Review Increase Physical Activity;Increase Strength and Stamina;Able to understand and use rate of perceived exertion (RPE) scale;Able to understand and use Dyspnea scale;Knowledge and understanding of Target Heart Rate Range (THRR);Understanding of Exercise Prescription Increase Physical Activity;Increase Strength and Stamina;Able to understand and use rate of perceived exertion (RPE) scale;Able to understand and use Dyspnea scale;Knowledge and understanding of Target Heart Rate Range (THRR);Understanding of Exercise Prescription Increase Physical Activity;Increase Strength and Stamina;Able to understand and use rate of perceived exertion (RPE) scale;Able to understand and use Dyspnea scale;Knowledge and understanding of Target Heart Rate Range (THRR);Understanding of Exercise Prescription     Comments Isabel has completed 3 exercise sessions Lisaanne exercises for 15 min on arm ergometer at level 2, 2.2 METs. She then exercises on the nustep at level 4 for 15 min, 1.8 METs. Bari performed the warmup and cooldown standing without limitations. She is motivated to  exercise. It is too soon to note and discernable progressions. Will continue to monitor and progress as able. Lilah has completed 7 exercise sessions. Vana has missed the last 2 exercise sessions due to covid infection. Narely exercises for 15 min on arm ergometer at level 4, 3.9 METs. She then exercises on the nustep at level 5 for 15 min, 2.3 METs. She has been progressing well with exercise until she got sick.  Cris performed the warmup and cooldown standing without limitations. She is motivated to exercise. Her home exercise plan was discussed recently and she was planning to begin. Will continue to monitor and progress as able. Kasiah has completed 15 exercise sessions. Lasasha is very motivated for rehab and has succeeded. She is currently exercising  for 15 min on arm ergometer at level 4, 2.9 METs. She then exercises on the nustep at level 6 for 15 min, 2.7 METs.  Riely performed the warmup and cooldown standing without limitations. She is motivated to exercise.  Will continue to monitor and progress as able. She is set to graduate next week.     Expected Outcomes Through exercise at rehab and home, the patient will decrease shortness of breath with daily activities and feel confident in carrying out an exercise regimen at home. Through exercise at rehab and home, the patient will decrease shortness of breath with daily activities and feel confident in carrying out an exercise regimen at home. Through exercise at rehab and home, the patient will decrease shortness of breath with daily activities and feel confident in carrying out an exercise regimen at home.              Discharge Exercise Prescription (Final Exercise Prescription Changes):  Exercise Prescription Changes - 03/29/22 1500       Response to Exercise   Blood Pressure (Admit) 120/80    Blood Pressure (Exercise) 164/86    Blood Pressure (Exit) 114/70    Heart Rate (Admit) 73 bpm    Heart Rate (Exercise) 95 bpm    Heart Rate  (Exit)  82 bpm    Oxygen Saturation (Admit) 98 %    Oxygen Saturation (Exercise) 93 %    Oxygen Saturation (Exit) 96 %    Rating of Perceived Exertion (Exercise) 11    Perceived Dyspnea (Exercise) 1    Duration Continue with 30 min of aerobic exercise without signs/symptoms of physical distress.    Intensity THRR unchanged      Progression   Progression Continue to progress workloads to maintain intensity without signs/symptoms of physical distress.      Resistance Training   Training Prescription Yes    Weight blue bands    Reps 10-15    Time 10 Minutes      Oxygen   Oxygen Continuous    Liters 2      NuStep   Level 6    SPM 80    Minutes 15    METs 2.6      Arm Ergometer   Level 4    Minutes 15    METs 2.9      Oxygen   Maintain Oxygen Saturation 88% or higher             Nutrition:  Target Goals: Understanding of nutrition guidelines, daily intake of sodium <1558m, cholesterol <2062m calories 30% from fat and 7% or less from saturated fats, daily to have 5 or more servings of fruits and vegetables.  Biometrics:    Nutrition Therapy Plan and Nutrition Goals:  Nutrition Therapy & Goals - 03/31/22 1551       Nutrition Therapy   Diet Heart healthy/carbohydrate consistent    Drug/Food Interactions Statins/Certain Fruits      Personal Nutrition Goals   Nutrition Goal Patient to choose a daily variety of fruits, vegetables, whole grains, lean protein/plant protein, nonfat dairy as part of heart healthy lifestyle    Personal Goal #2 Patient to limit to <150041mf sodium daily    Personal Goal #3 Patient to identify strategies for blood sugar control.    Comments Goals in progress. Blood pressure control has improved since starting Losartan on 02/17/22. She continues full work-up with cardiology regarding palpatations and occasional chest tightness with exertion; she has labs 10/20, cardiac CT 11/1, and echo 11/3. She continues to be mindful of high sodium foods,  reducing sodium intake to <1500m34mr day, and regular intake of high fiber foods including fruits/vegetables to support heart health and blood sugar control.      Intervention Plan   Intervention Prescribe, educate and counsel regarding individualized specific dietary modifications aiming towards targeted core components such as weight, hypertension, lipid management, diabetes, heart failure and other comorbidities.;Nutrition handout(s) given to patient.    Expected Outcomes Short Term Goal: Understand basic principles of dietary content, such as calories, fat, sodium, cholesterol and nutrients.;Long Term Goal: Adherence to prescribed nutrition plan.             Nutrition Assessments:  Nutrition Assessments - 03/17/22 0856       Rate Your Plate Scores   Post Score 55            MEDIFICTS Score Key: ?70 Need to make dietary changes  40-70 Heart Healthy Diet ? 40 Therapeutic Level Cholesterol Diet  Flowsheet Row PULMONARY REHAB CHRONIC OBSTRUCTIVE PULMONARY DISEASE from [facility]15/2022 [facility] Total Score on Discharge 55      Picture Your Plate Scores: <40 <35ealthy dietary pattern with much room for improvement. 41-50 Dietary pattern unlikely to  meet recommendations for good health and room for improvement. 51-60 More healthful dietary pattern, with some room for improvement.  >60 Healthy dietary pattern, although there may be some specific behaviors that could be improved.    Nutrition Goals Re-Evaluation:  Nutrition Goals Re-Evaluation     [place] Name 02/01/22 1545 03/01/22 1535 03/31/22 1551         Goals   Current Weight 197 lb 12 oz (89.7 kg) 195 lb 8.8 oz (88.7 kg)  Weight from 9/12, patient out r/t to covid 9/19, 9/21. 195 lb 1.7 oz (88.5 kg)     Comment A1c 5.8 No new labs at this time. Patient has maintained weight since starting with our program. --     Expected Outcome Discussed high protein/high fiber  snacks and the plate method as a guide for meal planning to support blood sugar control. Stressed the importance of eating prior to exercise to support blood sugar. Goals in progress. Patient recently started on Losartan (02/17/2022) r/t hypertension. Will continue to encourage reduction of high sodium foods, added salt, etc. Patient verbally reported not added salt to her foods. Goals in progress. Blood pressure control has improved since starting Losartan on 02/17/22. She continues full work-up with cardiology regarding palpatations and occasional chest tightness with exertion; she has labs 10/20, cardiac CT 11/1, and echo 11/3. She continues to be mindful of high sodium foods, reducing sodium intake to <1569m per day, and regular intake of high fiber foods including fruits/vegetables to support heart health and blood sugar control. She has not lost weight since starting with our program.              Nutrition Goals Discharge (Final Nutrition Goals Re-Evaluation):  Nutrition Goals Re-Evaluation - 03/31/22 1551       Goals   Current Weight 195 lb 1.7 oz (88.5 kg)    Expected Outcome Goals in progress. Blood pressure control has improved since starting Losartan on 02/17/22. She continues full work-up with cardiology regarding palpatations and occasional chest tightness with exertion; she has labs 10/20, cardiac CT 11/1, and echo 11/3. She continues to be mindful of high sodium foods, reducing sodium intake to <15040mper day, and regular intake of high fiber foods including fruits/vegetables to support heart health and blood sugar control. She has not lost weight since starting with our program.             Psychosocial: Target Goals: Acknowledge presence or absence of significant depression and/or stress, maximize coping skills, provide positive support system. Participant is able to verbalize types and ability to use techniques and skills needed for reducing stress and depression.  Initial  Review & Psychosocial Screening:  Initial Psych Review & Screening - 01/24/22 1046       Initial Review   Current issues with None Identified      Family Dynamics   Good Support System? Yes    Comments family      Barriers   Psychosocial barriers to participate in program There are no identifiable barriers or psychosocial needs.      Screening Interventions   Interventions Encouraged to exercise             Quality of Life Scores:  Scores of 19 and below usually indicate a poorer quality of life in these areas.  A difference of  2-3 points is a clinically meaningful difference.  A difference of 2-3 points in the total score of the Quality of Life Index has been associated with  significant improvement in overall quality of life, self-image, physical symptoms, and general health in studies assessing change in quality of life.  PHQ-9: Review Flowsheet       01/24/2022 07/23/2021  Depression screen PHQ 2/9  Decreased Interest 0 1  Down, Depressed, Hopeless 0 1  PHQ - 2 Score 0 2  Altered sleeping 0 1  Tired, decreased energy 0 1  Change in appetite 0 0  Feeling bad or failure about yourself  0 0  Trouble concentrating 0 0  Moving slowly or fidgety/restless 0 0  Suicidal thoughts 0 0  PHQ-9 Score 0 4  Difficult doing work/chores - Somewhat difficult   Interpretation of Total Score  Total Score Depression Severity:  1-4 = Minimal depression, 5-9 = Mild depression, 10-14 = Moderate depression, 15-19 = Moderately severe depression, 20-27 = Severe depression   Psychosocial Evaluation and Intervention:  Psychosocial Evaluation - 01/24/22 1046       Psychosocial Evaluation & Interventions   Interventions Encouraged to exercise with the program and follow exercise prescription    Expected Outcomes For Zenab to participate in Pulmonary Rehab    Continue Psychosocial Services  Follow up required by staff             Psychosocial Re-Evaluation:  Psychosocial  Re-Evaluation     [place] Name 02/08/22 [phone] 03/09/22 0947 04/05/22 0902         Psychosocial Re-Evaluation   Current issues with None Identified None Identified None Identified     Comments No change in psychosocial status since orientation to pulmonary rehab 01/24/2022.  Her husband drives her to class and is very supportive, she has no signs of depression and seems to enjoy exercising in the program.  No barriers or psychosocial concerns are identified at this time. Zaylah has been participating in the PR program without any psychosocial barriers. She seems to be enjoying the program and having social interaction with her class members. Her husband is very supportive driving her to class. No psychosocial needs identified at this time. Tyla has continued to participate in the PR program without any psychosocial barriers.She is very motivated to exercise and push herself to improve.Her husband has been very supportive to drive her to the program and support her in her exercise sessions.     Expected Outcomes For Kathyann to continue to be without barriers or psychosocial concerns while participating in pulmonary rehab. For her to continue to attend the program without any psychosocail conerns or issues. For her to conitue to attend the program without any psychosocial conerns or issues     Interventions Encouraged to attend Pulmonary Rehabilitation for the exercise Encouraged to attend Pulmonary Rehabilitation for the exercise Encouraged to attend Pulmonary Rehabilitation for the exercise     Continue Psychosocial Services  No Follow up required No Follow up required No Follow up required              Psychosocial Discharge (Final Psychosocial Re-Evaluation):  Psychosocial Re-Evaluation - 04/05/22 0902       Psychosocial Re-Evaluation   Current issues with None Identified    Comments Omayra has continued to participate in the PR program without any psychosocial barriers.She is very motivated to  exercise and push herself to improve.Her husband has been very supportive to drive her to the program and support her in her exercise sessions.    Expected Outcomes For her to conitue to attend the program without any psychosocial conerns or issues    Interventions Encouraged to  attend Pulmonary Rehabilitation for the exercise    Continue Psychosocial Services  No Follow up required             Education: Education Goals: Education classes will be provided on a weekly basis, covering required topics. Participant will state understanding/return demonstration of topics presented.  Learning Barriers/Preferences:  Learning Barriers/Preferences - 01/24/22 1046       Learning Barriers/Preferences   Learning Barriers None    Learning Preferences Individual Instruction;Computer/Internet;Pictoral;Written Material;Video;Verbal Instruction             Education Topics: Introduction to Pulmonary Rehab Group instruction provided by PowerPoint, verbal discussion, and written material to support subject matter. Instructor reviews what Pulmonary Rehab is, the purpose of the program, and how patients are referred.     Know Your Numbers Group instruction that is supported by a PowerPoint presentation. Instructor discusses importance of knowing and understanding resting, exercise, and post-exercise oxygen saturation, heart rate, and blood pressure. Oxygen saturation, heart rate, blood pressure, rating of perceived exertion, and dyspnea are reviewed along with a normal range for these values.    Exercise for the Pulmonary Patient Group instruction that is supported by a PowerPoint presentation. Instructor discusses benefits of exercise, core components of exercise, frequency, duration, and intensity of an exercise routine, importance of utilizing pulse oximetry during exercise, safety while exercising, and options of places to exercise outside of rehab.       MET Level  Group instruction  provided by PowerPoint, verbal discussion, and written material to support subject matter. Instructor reviews what METs are and how to increase METs.    Pulmonary Medications Verbally interactive group education provided by instructor with focus on inhaled medications and proper administration. Flowsheet Row PULMONARY REHAB CHRONIC OBSTRUCTIVE PULMONARY DISEASE from 03/24/2022 in [place]  Date 02/03/22  Educator Donnetta Simpers  Instruction Review Code 2- Demonstrated Understanding       Anatomy and Physiology of the Respiratory System Group instruction provided by PowerPoint, verbal discussion, and written material to support subject matter. Instructor reviews respiratory cycle and anatomical components of the respiratory system and their functions. Instructor also reviews differences in obstructive and restrictive respiratory diseases with examples of each.  Flowsheet Row PULMONARY REHAB CHRONIC OBSTRUCTIVE PULMONARY DISEASE from 03/24/2022 in [place]  Date 03/24/22  Educator EP  [Handout]  Instruction Review Code 1- Verbalizes Understanding       Oxygen Safety Group instruction provided by PowerPoint, verbal discussion, and written material to support subject matter. There is an overview of "What is Oxygen" and "Why do we need it".  Instructor also reviews how to create a safe environment for oxygen use, the importance of using oxygen as prescribed, and the risks of noncompliance. There is a brief discussion on traveling with oxygen and resources the patient may utilize. Flowsheet Row PULMONARY REHAB CHRONIC OBSTRUCTIVE PULMONARY DISEASE from 03/24/2022 in [place]  Date 02/17/22  Educator Donnetta Simpers  Instruction Review Code 1- Verbalizes Understanding       Oxygen Use Group instruction provided by PowerPoint, verbal discussion, and written material to discuss how supplemental oxygen is  prescribed and different types of oxygen supply systems. Resources for more information are provided.    Breathing Techniques Group instruction that is supported by demonstration and informational handouts. Instructor discusses the benefits of pursed lip and diaphragmatic breathing and detailed demonstration on how to perform both.     Risk Factor Reduction Group instruction that  is supported by a PowerPoint presentation. Instructor discusses the definition of a risk factor, different risk factors for pulmonary disease, and how the heart and lungs work together. Flowsheet Row PULMONARY REHAB CHRONIC OBSTRUCTIVE PULMONARY DISEASE from 03/24/2022 in [place]  Date 03/10/22  Educator EP  Instruction Review Code 1- Verbalizes Understanding       MD Day A group question and answer session with a medical doctor that allows participants to ask questions that relate to their pulmonary disease state.   Nutrition for the Pulmonary Patient Group instruction provided by PowerPoint slides, verbal discussion, and written materials to support subject matter. The instructor gives an explanation and review of healthy diet recommendations, which includes a discussion on weight management, recommendations for fruit and vegetable consumption, as well as protein, fluid, caffeine, fiber, sodium, sugar, and alcohol. Tips for eating when patients are short of breath are discussed.    Other Education Group or individual verbal, written, or video instructions that support the educational goals of the pulmonary rehab program. Flowsheet Row PULMONARY REHAB CHRONIC OBSTRUCTIVE PULMONARY DISEASE from 03/24/2022 in [place]  Date 07/29/21  Educator [employer] H/O  Instruction Review Code 1- Verbalizes Understanding        Knowledge Questionnaire Score:  Knowledge Questionnaire Score - 03/15/22 1531       Knowledge Questionnaire Score   Post  Score 17/18             Core Components/Risk Factors/Patient Goals at Admission:  Personal Goals and Risk Factors at Admission - 01/24/22 1047       Core Components/Risk Factors/Patient Goals on Admission    Weight Management --    Intervention --    Expected Outcomes --    Improve shortness of breath with ADL's Yes    Intervention Provide education, individualized exercise plan and daily activity instruction to help decrease symptoms of SOB with activities of daily living.    Expected Outcomes Short Term: Improve cardiorespiratory fitness to achieve a reduction of symptoms when performing ADLs;Long Term: Be able to perform more ADLs without symptoms or delay the onset of symptoms    Increase knowledge of respiratory medications and ability to use respiratory devices properly  Yes    Intervention Provide education and demonstration as needed of appropriate use of medications, inhalers, and oxygen therapy.    Expected Outcomes Short Term: Achieves understanding of medications use. Understands that oxygen is a medication prescribed by physician. Demonstrates appropriate use of inhaler and oxygen therapy.;Long Term: Maintain appropriate use of medications, inhalers, and oxygen therapy.             Core Components/Risk Factors/Patient Goals Review:   Goals and Risk Factor Review     Row Name 02/08/22 0947 04/05/22 0907           Core Components/Risk Factors/Patient Goals Review   Personal Goals Review Weight Management/Obesity;Increase knowledge of respiratory medications and ability to use respiratory devices properly.;Improve shortness of breath with ADL's;Develop more efficient breathing techniques such as purse lipped breathing and diaphragmatic breathing and practicing self-pacing with activity. Weight Management/Obesity;Improve shortness of breath with ADL's;Develop more efficient breathing techniques such as purse lipped breathing and diaphragmatic breathing and practicing  self-pacing with activity.;Increase knowledge of respiratory medications and ability to use respiratory devices properly.      Review Ailine has finally started pulmonary rehab and has attended 2 exercise sessions.  It is too early to see progression of weight loss and program  goals.  Her CBG's has been WNL with exercise as she takes metformin.  Should see improvement in goals in the next full 30 days. Vernona weight has been stable throughout the program. She has had consulation with the dietician. Her workloads and METS have have improved well on the nustep and arm ergometer. She continues to be motivated to exercise and push herself to improve. She states that she is enjoying the classes and her interaction with others in her class.Kember has recieved education information on oxygen saftey, pulmonary medictions and risk factors for pulmonary diseases.She reports mild SOB and her saturations on 2 liters of oxygen have been 93-96% with exercise.      Expected Outcomes See admission goals. See admission goals.               Core Components/Risk Factors/Patient Goals at Discharge (Final Review):   Goals and Risk Factor Review - 04/05/22 0907       Core Components/Risk Factors/Patient Goals Review   Personal Goals Review Weight Management/Obesity;Improve shortness of breath with ADL's;Develop more efficient breathing techniques such as purse lipped breathing and diaphragmatic breathing and practicing self-pacing with activity.;Increase knowledge of respiratory medications and ability to use respiratory devices properly.    Review Cherry weight has been stable throughout the program. She has had consulation with the dietician. Her workloads and METS have have improved well on the nustep and arm ergometer. She continues to be motivated to exercise and push herself to improve. She states that she is enjoying the classes and her interaction with others in her class.Ocia has recieved education information  on oxygen saftey, pulmonary medictions and risk factors for pulmonary diseases.She reports mild SOB and her saturations on 2 liters of oxygen have been 93-96% with exercise.    Expected Outcomes See admission goals.             ITP Comments: ITP REVIEW Pt is making expected progress toward pulmonary rehab goals after completing 16 sessions. Recommend continued exercise, life style modification, education, and utilization of breathing techniques to increase stamina and strength and decrease shortness of breath with exertion.   Comments: Dr. Rodman Pickle is Medical Director for Pulmonary Rehab at [facility].

## 2022-04-07 ENCOUNTER — Encounter (HOSPITAL_COMMUNITY)
Admission: RE | Admit: 2022-04-07 | Discharge: 2022-04-07 | Disposition: A | Discharge status: Home / Self Care | Payer: Medicare Other | Source: Ambulatory Visit | Attending: Internal Medicine | Admitting: Internal Medicine

## 2022-04-07 VITALS — Wt 194.7 lb

## 2022-04-07 DIAGNOSIS — J449 Chronic obstructive pulmonary disease, unspecified: Secondary | ICD-10-CM

## 2022-04-07 DIAGNOSIS — R002 Palpitations: Secondary | ICD-10-CM

## 2022-04-07 NOTE — Progress Notes (Signed)
Daily Session Note  Patient Details  Name: Amanda Garrett MRN: [id number] Date of Birth: 1955/07/23 Referring Provider:   [name] Pulmonary Rehab Walk Test from 01/24/2022 in [place]  Referring Provider [name]       Encounter Date: 04/07/2022  Check In:  Session Check In - 04/07/22 1344       Check-In   Supervising physician immediately available to respond to emergencies [facility] - Physician supervision    Physician(s) Gala Murdoch    Location [facility]    Staff Present Rodney Langton, RN;Randi Olen Cordial BS, ACSM-CEP, Exercise Physiologist;Sunshyne Horvath Rosana Hoes, MS, ACSM-CEP, Exercise Physiologist;David Lilyan Punt, MS, ACSM-CEP, CCRP, Exercise Physiologist;Jetta Walker BS, ACSM-CEP, Exercise Physiologist    Virtual Visit No    Medication changes reported     No    Fall or balance concerns reported    No    Tobacco Cessation No Change    Warm-up and Cool-down Performed as group-led instruction    Resistance Training Performed Yes    VAD Patient? No    PAD/SET Patient? No      Pain Assessment   Currently in Pain? No/denies    Multiple Pain Sites No             Capillary Blood Glucose: No results found for this or any previous visit (from the past 24 hour(s)).   Exercise Prescription Changes - 04/07/22 1500       Response to Exercise   Blood Pressure (Admit) 130/64    Blood Pressure (Exercise) 128/70    Blood Pressure (Exit) 132/78    Heart Rate (Admit) 74 bpm    Heart Rate (Exercise) 108 bpm    Heart Rate (Exit) 91 bpm    Oxygen Saturation (Admit) 98 %    Oxygen Saturation (Exercise) 94 %    Oxygen Saturation (Exit) 96 %    Rating of Perceived Exertion (Exercise) 10.5    Perceived Dyspnea (Exercise) 7    Duration Continue with 30 min of aerobic exercise without signs/symptoms of physical distress.    Intensity THRR unchanged      Progression   Progression Continue to progress workloads to maintain intensity without  signs/symptoms of physical distress.      Resistance Training   Training Prescription Yes    Weight blue bands    Reps 10-15    Time 10 Minutes      Oxygen   Oxygen Continuous    Liters 2      NuStep   Level 6    SPM 80    Minutes 15    METs 2.5      Arm Ergometer   Level 4    Minutes 15    METs 2.4      Oxygen   Maintain Oxygen Saturation 88% or higher             Social History   Tobacco Use  Smoking Status Former   Packs/day: 2.00   Years: 34.00   Total pack years: 68.00   Types: Cigarettes   Start date: 72   Quit date: 06/1996   Years since quitting: 25.8  Smokeless Tobacco Never    Goals Met:  Proper associated with RPD/PD & O2 Sat Independence with exercise equipment Exercise tolerated well No report of concerns or symptoms today Strength training completed today  Goals Unmet:  Not Applicable  Comments: Service time is from 1326 to 1447.    Dr. Rodman Pickle is Medical Director  for Pulmonary Rehab at [facility].

## 2022-04-07 NOTE — Progress Notes (Signed)
Discharge Progress Report  Patient Details  Name: Amanda Garrett MRN: [id number] Date of Birth: 1955/08/22 Referring Provider:   [name] [facility] Walk Test from 01/24/2022 in [place]  Referring Provider [name]        Number of Visits: 17  Reason for Discharge:  Patient has met program and personal goals.  Smoking History:  Social History   Tobacco Use  Smoking Status Former   Packs/day: 2.00   Years: 34.00   Total pack years: 68.00   Types: Cigarettes   Start date: 1965   Quit date: 06/1996   Years since quitting: 25.8  Smokeless Tobacco Never    Diagnosis:  Stage 3 severe COPD by GOLD classification ([place])  ADL [facility]:  Pulmonary Assessment Scores     Row Name 01/24/22 1106 03/15/22 1529       ADL [facility]   ADL Phase Entry Exit    SOB Score total 33 58      CAT Score   CAT Score 13 14      mMRC Score   mMRC Score 1 --             Initial Exercise Prescription:  Initial Exercise Prescription - 01/24/22 1200       Date of Initial Exercise RX and Referring Provider   Date 01/24/22    Referring Provider [name]    Expected Discharge Date 03/31/22      Oxygen   Oxygen Continuous    Liters 2    Maintain Oxygen Saturation 88% or higher      Recumbant Bike   Level --    Watts --    Minutes --      NuStep   Level 1    SPM 60    Minutes 15      Arm Ergometer   Level 1    Watts 5    Minutes 15      Prescription Details   Frequency (times per week) 2    Duration Progress to 30 minutes of continuous aerobic without signs/symptoms of physical distress      Intensity   THRR 40-80% of Max Heartrate 61-122    Ratings of Perceived Exertion 11-13    Perceived Dyspnea 0-4      Progression   Progression Continue to progress workloads to maintain intensity without signs/symptoms of physical distress.      Resistance Training   Training Prescription Yes    Weight red bands    Reps 10-15              Discharge Exercise Prescription (Final Exercise Prescription Changes):  Exercise Prescription Changes - 04/07/22 1500       Response to Exercise   Blood Pressure (Admit) 130/64    Blood Pressure (Exercise) 128/70    Blood Pressure (Exit) 132/78    Heart Rate (Admit) 74 bpm    Heart Rate (Exercise) 108 bpm    Heart Rate (Exit) 91 bpm    Oxygen Saturation (Admit) 98 %    Oxygen Saturation (Exercise) 94 %    Oxygen Saturation (Exit) 96 %    Rating of Perceived Exertion (Exercise) 10.5    Perceived Dyspnea (Exercise) 7    Duration Continue with 30 min of aerobic exercise without signs/symptoms of physical distress.    Intensity THRR unchanged      Progression   Progression Continue to progress workloads to maintain intensity without signs/symptoms of physical distress.  Resistance Training   Training Prescription Yes    Weight blue bands    Reps 10-15    Time 10 Minutes      Oxygen   Oxygen Continuous    Liters 2      NuStep   Level 6    SPM 80    Minutes 15    METs 2.5      Arm Ergometer   Level 4    Minutes 15    METs 2.4      Oxygen   Maintain Oxygen Saturation 88% or higher             Functional Capacity:  6 Minute Walk     Row Name 01/24/22 1217 04/05/22 1545       6 Minute Walk   Phase Initial Discharge    Distance 1032 feet 1010 feet    Distance % Change -- -2.13 %    Distance Feet Change -- -22 ft    Walk Time 6 minutes 6 minutes    # of Rest Breaks 0 1    MPH 1.95 1.91    METS 2.28 2.65    RPE 13 11    Perceived Dyspnea  3 1    VO2 Peak 7.96 9.26    Symptoms No Yes (comment)    Comments -- weak, had to rest 4:00-5:30    Resting HR 85 bpm 70 bpm    Resting BP 130/72 124/72    Resting Oxygen Saturation  96 % 99 %    Exercise Oxygen Saturation  during 6 min walk 89 % 89 %    Max Ex. HR 114 bpm 123 bpm    Max Ex. BP 150/86 184/80    2 Minute Post BP 134/70 168/80      Interval HR   1 Minute HR 101 104    2 Minute HR 109 115     3 Minute HR 112 120    4 Minute HR 114 123    5 Minute HR 112 105    6 Minute HR 112 108    2 Minute Post HR 84 88    Interval Heart Rate? Yes Yes      Interval Oxygen   Interval Oxygen? Yes Yes    Baseline Oxygen Saturation % 96 % 99 %    1 Minute Oxygen Saturation % 96 % 96 %    1 Minute Liters of Oxygen 2 L 2 L    2 Minute Oxygen Saturation % 92 % 90 %    2 Minute Liters of Oxygen 2 L 2 L    3 Minute Oxygen Saturation % 91 % 89 %    3 Minute Liters of Oxygen 2 L 2 L    4 Minute Oxygen Saturation % 90 % 89 %    4 Minute Liters of Oxygen 2 L 2 L    5 Minute Oxygen Saturation % 90 % 93 %    5 Minute Liters of Oxygen 2 L 2 L    6 Minute Oxygen Saturation % 89 % 96 %    6 Minute Liters of Oxygen 2 L 2 L    2 Minute Post Oxygen Saturation % 97 % 96 %    2 Minute Post Liters of Oxygen 2 L 2 L             Psychological, QOL, Others - Outcomes: PHQ 2/9:    04/07/2022    8:45 AM 01/24/2022  10:45 AM 07/23/2021    1:31 PM  Depression screen PHQ 2/9  Decreased Interest 0 0 1  Down, Depressed, Hopeless 0 0 1  PHQ - 2 Score 0 0 2  Altered sleeping  0 1  Tired, decreased energy  0 1  Change in appetite  0 0  Feeling bad or failure about yourself   0 0  Trouble concentrating  0 0  Moving slowly or fidgety/restless  0 0  Suicidal thoughts  0 0  PHQ-9 Score  0 4  Difficult doing work/chores   Somewhat difficult    Quality of Life:   Personal Goals: Goals established at orientation with interventions provided to work toward goal.  Personal Goals and Risk Factors at Admission - 01/24/22 1047       Core Components/Risk Factors/Patient Goals on Admission    Weight Management --    Intervention --    Expected Outcomes --    Improve shortness of breath with ADL's Yes    Intervention Provide education, individualized exercise plan and daily activity instruction to help decrease symptoms of SOB with activities of daily living.    Expected Outcomes Short Term: Improve  cardiorespiratory fitness to achieve a reduction of symptoms when performing ADLs;Long Term: Be able to perform more ADLs without symptoms or delay the onset of symptoms    Increase knowledge of respiratory medications and ability to use respiratory devices properly  Yes    Intervention Provide education and demonstration as needed of appropriate use of medications, inhalers, and oxygen therapy.    Expected Outcomes Short Term: Achieves understanding of medications use. Understands that oxygen is a medication prescribed by physician. Demonstrates appropriate use of inhaler and oxygen therapy.;Long Term: Maintain appropriate use of medications, inhalers, and oxygen therapy.              Personal Goals Discharge:  Goals and Risk Factor Review     Row Name 02/08/22 0947 04/05/22 0907           Core Components/Risk Factors/Patient Goals Review   Personal Goals Review Weight Management/Obesity;Increase knowledge of respiratory medications and ability to use respiratory devices properly.;Improve shortness of breath with ADL's;Develop more efficient breathing techniques such as purse lipped breathing and diaphragmatic breathing and practicing self-pacing with activity. Weight Management/Obesity;Improve shortness of breath with ADL's;Develop more efficient breathing techniques such as purse lipped breathing and diaphragmatic breathing and practicing self-pacing with activity.;Increase knowledge of respiratory medications and ability to use respiratory devices properly.      Review Tacha has finally started pulmonary rehab and has attended 2 exercise sessions.  It is too early to see progression of weight loss and program goals.  Her CBG's has been WNL with exercise as she takes metformin.  Should see improvement in goals in the next full 30 days. Kerigan weight has been stable throughout the program. She has had consulation with the dietician. Her workloads and METS have have improved well on the nustep  and arm ergometer. She continues to be motivated to exercise and push herself to improve. She states that she is enjoying the classes and her interaction with others in her class.Felipa has recieved education information on oxygen saftey, pulmonary medictions and risk factors for pulmonary diseases.She reports mild SOB and her saturations on 2 liters of oxygen have been 93-96% with exercise.      Expected Outcomes See admission goals. See admission goals.               Exercise Goals  and Review:  Exercise Goals     Row Name 01/24/22 1049 02/09/22 1008 03/07/22 0958 03/31/22 1549       Exercise Goals   Increase Physical Activity Yes Yes Yes Yes    Intervention Provide advice, education, support and counseling about physical activity/exercise needs.;Develop an individualized exercise prescription for aerobic and resistive training based on initial evaluation findings, risk stratification, comorbidities and participant's personal goals. Provide advice, education, support and counseling about physical activity/exercise needs.;Develop an individualized exercise prescription for aerobic and resistive training based on initial evaluation findings, risk stratification, comorbidities and participant's personal goals. Provide advice, education, support and counseling about physical activity/exercise needs.;Develop an individualized exercise prescription for aerobic and resistive training based on initial evaluation findings, risk stratification, comorbidities and participant's personal goals. Provide advice, education, support and counseling about physical activity/exercise needs.;Develop an individualized exercise prescription for aerobic and resistive training based on initial evaluation findings, risk stratification, comorbidities and participant's personal goals.    Expected Outcomes Short Term: Attend rehab on a regular basis to increase amount of physical activity.;Long Term: Add in home exercise to  make exercise part of routine and to increase amount of physical activity.;Long Term: Exercising regularly at least 3-5 days a week. Short Term: Attend rehab on a regular basis to increase amount of physical activity.;Long Term: Add in home exercise to make exercise part of routine and to increase amount of physical activity.;Long Term: Exercising regularly at least 3-5 days a week. Short Term: Attend rehab on a regular basis to increase amount of physical activity.;Long Term: Add in home exercise to make exercise part of routine and to increase amount of physical activity.;Long Term: Exercising regularly at least 3-5 days a week. Short Term: Attend rehab on a regular basis to increase amount of physical activity.;Long Term: Add in home exercise to make exercise part of routine and to increase amount of physical activity.;Long Term: Exercising regularly at least 3-5 days a week.    Increase Strength and Stamina Yes Yes Yes Yes    Intervention Provide advice, education, support and counseling about physical activity/exercise needs.;Develop an individualized exercise prescription for aerobic and resistive training based on initial evaluation findings, risk stratification, comorbidities and participant's personal goals. Provide advice, education, support and counseling about physical activity/exercise needs.;Develop an individualized exercise prescription for aerobic and resistive training based on initial evaluation findings, risk stratification, comorbidities and participant's personal goals. Provide advice, education, support and counseling about physical activity/exercise needs.;Develop an individualized exercise prescription for aerobic and resistive training based on initial evaluation findings, risk stratification, comorbidities and participant's personal goals. Provide advice, education, support and counseling about physical activity/exercise needs.;Develop an individualized exercise prescription for aerobic  and resistive training based on initial evaluation findings, risk stratification, comorbidities and participant's personal goals.    Expected Outcomes Short Term: Perform resistance training exercises routinely during rehab and add in resistance training at home;Short Term: Increase workloads from initial exercise prescription for resistance, speed, and METs.;Long Term: Improve cardiorespiratory fitness, muscular endurance and strength as measured by increased METs and functional capacity (6MWT) Short Term: Perform resistance training exercises routinely during rehab and add in resistance training at home;Short Term: Increase workloads from initial exercise prescription for resistance, speed, and METs.;Long Term: Improve cardiorespiratory fitness, muscular endurance and strength as measured by increased METs and functional capacity (6MWT) Short Term: Perform resistance training exercises routinely during rehab and add in resistance training at home;Short Term: Increase workloads from initial exercise prescription for resistance, speed, and METs.;Long Term: Improve cardiorespiratory  fitness, muscular endurance and strength as measured by increased METs and functional capacity (6MWT) Short Term: Perform resistance training exercises routinely during rehab and add in resistance training at home;Short Term: Increase workloads from initial exercise prescription for resistance, speed, and METs.;Long Term: Improve cardiorespiratory fitness, muscular endurance and strength as measured by increased METs and functional capacity (6MWT)    Able to understand and use rate of perceived exertion (RPE) scale Yes Yes Yes Yes    Intervention Provide education and explanation on how to use RPE scale Provide education and explanation on how to use RPE scale Provide education and explanation on how to use RPE scale Provide education and explanation on how to use RPE scale    Expected Outcomes Short Term: Able to use RPE daily in  rehab to express subjective intensity level;Long Term:  Able to use RPE to guide intensity level when exercising independently Short Term: Able to use RPE daily in rehab to express subjective intensity level;Long Term:  Able to use RPE to guide intensity level when exercising independently Short Term: Able to use RPE daily in rehab to express subjective intensity level;Long Term:  Able to use RPE to guide intensity level when exercising independently Short Term: Able to use RPE daily in rehab to express subjective intensity level;Long Term:  Able to use RPE to guide intensity level when exercising independently    Able to understand and use Dyspnea scale Yes Yes Yes Yes    Intervention Provide education and explanation on how to use Dyspnea scale Provide education and explanation on how to use Dyspnea scale Provide education and explanation on how to use Dyspnea scale Provide education and explanation on how to use Dyspnea scale    Expected Outcomes Short Term: Able to use Dyspnea scale daily in rehab to express subjective sense of shortness of breath during exertion;Long Term: Able to use Dyspnea scale to guide intensity level when exercising independently Short Term: Able to use Dyspnea scale daily in rehab to express subjective sense of shortness of breath during exertion;Long Term: Able to use Dyspnea scale to guide intensity level when exercising independently Short Term: Able to use Dyspnea scale daily in rehab to express subjective sense of shortness of breath during exertion;Long Term: Able to use Dyspnea scale to guide intensity level when exercising independently Short Term: Able to use Dyspnea scale daily in rehab to express subjective sense of shortness of breath during exertion;Long Term: Able to use Dyspnea scale to guide intensity level when exercising independently    Knowledge and understanding of Target Heart Rate Range (THRR) Yes Yes Yes Yes    Intervention Provide education and explanation  of THRR including how the numbers were predicted and where they are located for reference Provide education and explanation of THRR including how the numbers were predicted and where they are located for reference Provide education and explanation of THRR including how the numbers were predicted and where they are located for reference Provide education and explanation of THRR including how the numbers were predicted and where they are located for reference    Expected Outcomes Short Term: Able to state/look up THRR;Long Term: Able to use THRR to govern intensity when exercising independently;Short Term: Able to use daily as guideline for intensity in rehab Short Term: Able to state/look up THRR;Long Term: Able to use THRR to govern intensity when exercising independently;Short Term: Able to use daily as guideline for intensity in rehab Short Term: Able to state/look up THRR;Long Term: Able to  use THRR to govern intensity when exercising independently;Short Term: Able to use daily as guideline for intensity in rehab Short Term: Able to state/look up THRR;Long Term: Able to use THRR to govern intensity when exercising independently;Short Term: Able to use daily as guideline for intensity in rehab    Understanding of Exercise Prescription Yes Yes Yes Yes    Intervention Provide education, explanation, and written materials on patient's individual exercise prescription Provide education, explanation, and written materials on patient's individual exercise prescription Provide education, explanation, and written materials on patient's individual exercise prescription Provide education, explanation, and written materials on patient's individual exercise prescription    Expected Outcomes Short Term: Able to explain program exercise prescription;Long Term: Able to explain home exercise prescription to exercise independently Short Term: Able to explain program exercise prescription;Long Term: Able to explain home  exercise prescription to exercise independently Short Term: Able to explain program exercise prescription;Long Term: Able to explain home exercise prescription to exercise independently Short Term: Able to explain program exercise prescription;Long Term: Able to explain home exercise prescription to exercise independently             Exercise Goals Re-Evaluation:  Exercise Goals Re-Evaluation     Row Name 02/09/22 1013 03/07/22 0958 03/31/22 1549         Exercise Goal Re-Evaluation   Exercise Goals Review Increase Physical Activity;Increase Strength and Stamina;Able to understand and use rate of perceived exertion (RPE) scale;Able to understand and use Dyspnea scale;Knowledge and understanding of Target Heart Rate Range (THRR);Understanding of Exercise Prescription Increase Physical Activity;Increase Strength and Stamina;Able to understand and use rate of perceived exertion (RPE) scale;Able to understand and use Dyspnea scale;Knowledge and understanding of Target Heart Rate Range (THRR);Understanding of Exercise Prescription Increase Physical Activity;Increase Strength and Stamina;Able to understand and use rate of perceived exertion (RPE) scale;Able to understand and use Dyspnea scale;Knowledge and understanding of Target Heart Rate Range (THRR);Understanding of Exercise Prescription     Comments Kristeen has completed 3 exercise sessions Giannamarie exercises for 15 min on arm ergometer at level 2, 2.2 METs. She then exercises on the nustep at level 4 for 15 min, 1.8 METs. Chontel performed the warmup and cooldown standing without limitations. She is motivated to exercise. It is too soon to note and discernable progressions. Will continue to monitor and progress as able. Kaycie has completed 7 exercise sessions. Quaniya has missed the last 2 exercise sessions due to covid infection. Oliver exercises for 15 min on arm ergometer at level 4, 3.9 METs. She then exercises on the nustep at level 5 for 15 min,  2.3 METs. She has been progressing well with exercise until she got sick.  Antonina performed the warmup and cooldown standing without limitations. She is motivated to exercise. Her home exercise plan was discussed recently and she was planning to begin. Will continue to monitor and progress as able. Shloka has completed 15 exercise sessions. Jalayla is very motivated for rehab and has succeeded. She is currently exercising  for 15 min on arm ergometer at level 4, 2.9 METs. She then exercises on the nustep at level 6 for 15 min, 2.7 METs.  Palmer performed the warmup and cooldown standing without limitations. She is motivated to exercise.  Will continue to monitor and progress as able. She is set to graduate next week.     Expected Outcomes Through exercise at rehab and home, the patient will decrease shortness of breath with daily activities and feel confident in carrying out an exercise regimen  at home. Through exercise at rehab and home, the patient will decrease shortness of breath with daily activities and feel confident in carrying out an exercise regimen at home. Through exercise at rehab and home, the patient will decrease shortness of breath with daily activities and feel confident in carrying out an exercise regimen at home.              Nutrition & Weight - Outcomes:    Nutrition:  Nutrition Therapy & Goals - 03/31/22 1551       Nutrition Therapy   Diet Heart healthy/carbohydrate consistent    Drug/Food Interactions Statins/Certain Fruits      Personal Nutrition Goals   Nutrition Goal Patient to choose a daily variety of fruits, vegetables, whole grains, lean protein/plant protein, nonfat dairy as part of heart healthy lifestyle    Personal Goal #2 Patient to limit to '1500mg'$  of sodium daily    Personal Goal #3 Patient to identify strategies for blood sugar control.    Comments Goals in progress. Blood pressure control has improved since starting Losartan on 02/17/22. She continues  full work-up with cardiology regarding palpatations and occasional chest tightness with exertion; she has labs 10/20, cardiac CT 11/1, and echo 11/3. She continues to be mindful of high sodium foods, reducing sodium intake to '1500mg'$  per day, and regular intake of high fiber foods including fruits/vegetables to support heart health and blood sugar control.      Intervention Plan   Intervention Prescribe, educate and counsel regarding individualized specific dietary modifications aiming towards targeted core components such as weight, hypertension, lipid management, diabetes, heart failure and other comorbidities.;Nutrition handout(s) given to patient.    Expected Outcomes Short Term Goal: Understand basic principles of dietary content, such as calories, fat, sodium, cholesterol and nutrients.;Long Term Goal: Adherence to prescribed nutrition plan.             Nutrition Discharge:  Nutrition Assessments - 03/17/22 0856       Rate Your Plate Scores   Post Score 55             Education Questionnaire Score:  Knowledge Questionnaire Score - 03/15/22 1531       Knowledge Questionnaire Score   Post Score 17/18             Goals reviewed with patient; copy given to patient.

## 2022-04-12 ENCOUNTER — Telehealth (HOSPITAL_COMMUNITY): Payer: Self-pay | Admitting: *Deleted

## 2022-04-12 ENCOUNTER — Telehealth: Payer: Self-pay | Admitting: Cardiology

## 2022-04-12 NOTE — Telephone Encounter (Signed)
Pt calling in regards to a medication that she is supposed to increase due to lab work. Pt can not remember which medication it is. Please advise

## 2022-04-12 NOTE — Telephone Encounter (Signed)
Reaching out to patient to offer assistance regarding upcoming cardiac imaging study; pt verbalizes understanding of appt date/time, parking situation and where to check in, medications ordered, and verified current allergies; name and call back number provided for further questions should they arise  Gordy Clement RN Navigator Cardiac Imaging [facility] [phone] office [phone] cell  Patient to take '15mg'$  ivabradine two hours prior to her cardiac CT scan.  She is aware to arrive at 2pm.

## 2022-04-12 NOTE — Telephone Encounter (Signed)
Attempted to call patient regarding upcoming cardiac CT appointment. °Left message on voicemail with name and callback number ° °Dalayza Zambrana RN Navigator Cardiac Imaging °[facility] °[phone] Office °[phone] Cell ° °

## 2022-04-12 NOTE — Telephone Encounter (Signed)
The patient has been notified of the result and verbalized understanding.  All questions (if any) were answered.   Pt agreed to increase her atorvastatin to 20 mg po daily. She will come back into the office for repeat labs to recheck lipids in 8 weeks, on 05/30/22.  She is aware to come fasting to this lab appt.    Confirmed the pharmacy of choice with the pt.    Pt verbalized understanding and agrees with this plan.   Freada Bergeron, MD  04/04/2022  9:07 AM EDT     Her LDL is above goal at 82. Is she willing to bump her lipitor to '20mg'$  daily? If so, we can repeat cholesterol in 6-8 weeks to ensure at goal.    Pt aware that Dr. Johney Frame increased her atorvastatin to 20 mg po daily on 10/23. Pt aware this was sent to her pharmacy [facility] on [address].   Pt verbalized understanding and agrees with this plan.

## 2022-04-13 ENCOUNTER — Ambulatory Visit (HOSPITAL_COMMUNITY)
Admission: RE | Admit: 2022-04-13 | Discharge: 2022-04-13 | Disposition: A | Discharge status: Home / Self Care | Payer: Medicare Other | Source: Ambulatory Visit | Attending: Cardiology | Admitting: Cardiology

## 2022-04-13 ENCOUNTER — Other Ambulatory Visit: Payer: Self-pay | Admitting: Nurse Practitioner

## 2022-04-13 DIAGNOSIS — R072 Precordial pain: Secondary | ICD-10-CM | POA: Insufficient documentation

## 2022-04-13 DIAGNOSIS — J302 Other seasonal allergic rhinitis: Secondary | ICD-10-CM

## 2022-04-13 DIAGNOSIS — E7800 Pure hypercholesterolemia, unspecified: Secondary | ICD-10-CM | POA: Diagnosis present

## 2022-04-13 MED ORDER — METOPROLOL TARTRATE 5 MG/5ML IV SOLN: 5.0000 mg | INTRAVENOUS | Status: DC | PRN

## 2022-04-13 MED ORDER — IOHEXOL 350 MG/ML SOLN
100.0000 mL | Freq: Once | INTRAVENOUS | Status: AC | PRN
  Administered 2022-04-13: 100 mL via INTRAVENOUS

## 2022-04-13 MED ORDER — NITROGLYCERIN 0.4 MG SL SUBL
SUBLINGUAL_TABLET | SUBLINGUAL | Status: AC
  Filled 2022-04-13: qty 2

## 2022-04-13 MED ORDER — METOPROLOL TARTRATE 5 MG/5ML IV SOLN
INTRAVENOUS | Status: AC
  Administered 2022-04-13: 10 mg via INTRAVENOUS
  Filled 2022-04-13: qty 10

## 2022-04-13 MED ORDER — DILTIAZEM HCL 25 MG/5ML IV SOLN: 5.0000 mg | INTRAVENOUS | Status: DC | PRN

## 2022-04-13 MED ORDER — NITROGLYCERIN 0.4 MG SL SUBL
0.8000 mg | SUBLINGUAL_TABLET | Freq: Once | SUBLINGUAL | Status: AC
  Administered 2022-04-13: 0.8 mg via SUBLINGUAL

## 2022-04-13 MED ORDER — DILTIAZEM HCL 25 MG/5ML IV SOLN
INTRAVENOUS | Status: AC
  Filled 2022-04-13: qty 5

## 2022-04-14 ENCOUNTER — Encounter: Payer: Self-pay | Admitting: Internal Medicine

## 2022-04-14 ENCOUNTER — Ambulatory Visit (INDEPENDENT_AMBULATORY_CARE_PROVIDER_SITE_OTHER): Payer: Medicare Other | Admitting: Internal Medicine

## 2022-04-14 VITALS — BP 140/76 | HR 82 | Temp 98.0°F | Ht 61.0 in | Wt 193.6 lb

## 2022-04-14 DIAGNOSIS — G4733 Obstructive sleep apnea (adult) (pediatric): Secondary | ICD-10-CM | POA: Diagnosis not present

## 2022-04-14 DIAGNOSIS — J9611 Chronic respiratory failure with hypoxia: Secondary | ICD-10-CM

## 2022-04-14 DIAGNOSIS — J4489 Other specified chronic obstructive pulmonary disease: Secondary | ICD-10-CM

## 2022-04-14 DIAGNOSIS — J439 Emphysema, unspecified: Secondary | ICD-10-CM

## 2022-04-14 DIAGNOSIS — R911 Solitary pulmonary nodule: Secondary | ICD-10-CM

## 2022-04-14 MED ORDER — TRELEGY ELLIPTA 200-62.5-25 MCG/ACT IN AEPB: 1.0000 | INHALATION_SPRAY | Freq: Every day | RESPIRATORY_TRACT | 0 refills | Status: DC

## 2022-04-14 NOTE — Progress Notes (Signed)
Amanda Garrett    [id number]    Mar 30, 1956  Primary Care Physician:Elkins, Curt Jews, MD Date of Appointment: 04/14/2022 Established Patient Visit  Chief complaint:   Chief Complaint  Patient presents with   Follow-up    Doing well.  No sx noted.  Pulmonary Rehab helped     HPI: Amanda Garrett is a 66 y.o. woman with COPD and chronic bronchitis and chronic respiratory failure on 2LNC with nocturnal bipap.  Interval Updates: Here for COPD follow up.  No interval hospitalizations or ED visits.  Went to pulmonary rehab and noticed a dramatic improvement in her dyspnea. She really enjoyed this.  Still on once daily trelegy. Inquiring about financial assistance.  Minimal albuterol use.   Seasonal allergic rhinitis improved with medications.   Taking trelegy once a day. Minimal albuterol use.   Here with her daughter.    Past Medical History:  Diagnosis Date   Asthma    COPD (chronic obstructive pulmonary disease) ([place])    Diabetes mellitus without complication (HCC)    Emphysema of lung ([place])    GERD (gastroesophageal reflux disease)    Hypercholesteremia 2011   Hypertension    Lichen sclerosus et atrophicus    vulva   Oxygen deficiency    Palpitation    on Inderal    Past Surgical History:  Procedure Laterality Date   BIOPSY  03/01/2021   Procedure: BIOPSY;  Surgeon: Yetta Flock, MD;  Location: [facility];  Service: Gastroenterology;;   [place]     COLONOSCOPY WITH PROPOFOL N/A 03/01/2021   Procedure: COLONOSCOPY WITH PROPOFOL;  Surgeon: Yetta Flock, MD;  Location: [facility];  Service: Gastroenterology;  Laterality: N/A;   DILATION AND CURETTAGE, DIAGNOSTIC / THERAPEUTIC  05/2010   hysteroscopy, polypectomy   ESOPHAGOGASTRODUODENOSCOPY (EGD) WITH PROPOFOL N/A 03/01/2021   Procedure: ESOPHAGOGASTRODUODENOSCOPY (EGD) WITH PROPOFOL;  Surgeon: Yetta Flock, MD;  Location: [facility];  Service: Gastroenterology;  Laterality: N/A;   HEMOSTASIS CLIP PLACEMENT  03/01/2021   Procedure: HEMOSTASIS CLIP PLACEMENT;  Surgeon: Yetta Flock, MD;  Location: [facility];  Service: Gastroenterology;;   KNEE ARTHROSCOPY Right 2013   POLYPECTOMY  03/01/2021   Procedure: POLYPECTOMY;  Surgeon: Yetta Flock, MD;  Location: [facility];  Service: Gastroenterology;;   [place]   dr Pearline Cables    Family History  Problem Relation Age of Onset   Colon polyps Mother    Diabetes Mother    Diverticulitis Mother    Diabetes Father    Diabetes Maternal Grandfather    Hypertension Maternal Grandfather    Breast cancer Paternal Grandmother    Ulcerative colitis Daughter    COPD Neg Hx    Lung cancer Neg Hx    Colon cancer Neg Hx    Esophageal cancer Neg Hx    Pancreatic cancer Neg Hx    Stomach cancer Neg Hx    Rectal cancer Neg Hx     Social History   Occupational History   Not on file  Tobacco Use   Smoking status: Former    Packs/day: 2.00    Years: 34.00    Total pack years: 68.00    Types: Cigarettes    Start date: 49    Quit date: 06/1996    Years since quitting: 25.8   Smokeless tobacco: Never  Vaping Use  Vaping Use: Never used  Substance and Sexual Activity   Alcohol use: Yes    Comment: rare   Drug use: Never   Sexual activity: Not on file     Physical Exam: Blood pressure (!) 140/76, pulse 82, temperature 98 F (36.7 C), temperature source Oral, height '5\' 1"'$  (1.549 m), weight 193 lb 9.6 oz (87.8 kg), SpO2 99 %.  Gen:     On oxygen, nad Lungs:   diminished, no wheezes or crackles CV:         RRR   Data Reviewed: Imaging: I have personally reviewed the CT Angio from May 2022 which shows   PFTs:     Latest Ref Rng & Units 12/02/2020    3:50 PM  PFT Results  FVC-Pre L 1.29   FVC-Predicted Pre % 47   FVC-Post L 1.28   FVC-Predicted Post % 46   Pre FEV1/FVC % % 55   Post FEV1/FCV % % 58    FEV1-Pre L 0.70   FEV1-Predicted Pre % 33   FEV1-Post L 0.75   DLCO uncorrected ml/min/mmHg 13.56   DLCO UNC% % 76   DLCO corrected ml/min/mmHg 13.65   DLCO COR %Predicted % 76   DLVA Predicted % 113   TLC L 4.19   TLC % Predicted % 90   RV % Predicted % 139    I have personally reviewed the patient's PFTs and they show very severe airflow limitation.   Labs: ABG shows hypercapnia - PCO2 65 mm Hg  Immunization status: Immunization History  Administered Date(s) Administered   Influenza-Unspecified 04/06/2022   PFIZER(Purple Top)SARS-COV-2 Vaccination 08/15/2019, 09/11/2019, 03/02/2020    Assessment:  Very Severe COPD FEV1 33% of predicted Chronic hypoxemic and hypercapnic respiratory failure Solitary pulmonary nodule  OSA on BIPAP  Plan/Recommendations:  continue trelegy, prn albuterol continue nocturnal NIV copntinue home oxygen. Will re-refer to pulmonary rehab for gold stage 4 copd.  Repeat CT Chest in March 2024 for follow up.   Return to Care: Return in about 4 months (around 08/13/2022).   Lenice Llamas, MD Pulmonary and [place]

## 2022-04-14 NOTE — Patient Instructions (Addendum)
Please schedule follow up scheduled with myself in 4 months - needs to be after CT scan  If my schedule is not open yet, we will contact you with a reminder closer to that time. Please call [phone] if you haven't heard from [place] a month before.   Before your next visit I would like you to have: CT Chest in 4 months - we will call you to schedule this an and appointment with me afterwards.  Continue trelegy inhaler once a day. Continue albuterol as needed.  Call us sooner if any issues or concerns about your breathing.   We will look into patient assistance with [employer] for your trelegy inhaler.

## 2022-04-14 NOTE — Addendum Note (Signed)
Addended byOralia Rud M on: 04/14/2022 01:28 PM   Modules accepted: Orders

## 2022-04-15 ENCOUNTER — Telehealth: Payer: Self-pay | Admitting: Internal Medicine

## 2022-04-15 ENCOUNTER — Ambulatory Visit (HOSPITAL_COMMUNITY): Payer: Medicare Other | Attending: Cardiology

## 2022-04-15 DIAGNOSIS — R002 Palpitations: Secondary | ICD-10-CM | POA: Diagnosis present

## 2022-04-15 DIAGNOSIS — Z79899 Other long term (current) drug therapy: Secondary | ICD-10-CM | POA: Diagnosis present

## 2022-04-15 DIAGNOSIS — R072 Precordial pain: Secondary | ICD-10-CM | POA: Diagnosis present

## 2022-04-15 DIAGNOSIS — E7800 Pure hypercholesterolemia, unspecified: Secondary | ICD-10-CM | POA: Insufficient documentation

## 2022-04-15 LAB — ECHOCARDIOGRAM COMPLETE
Area-P 1/2: 3.76 cm2
S' Lateral: 3 cm

## 2022-04-15 MED ORDER — PERFLUTREN LIPID MICROSPHERE
2.0000 mL | INTRAVENOUS | Status: AC | PRN
  Administered 2022-04-15: 2 mL via INTRAVENOUS

## 2022-04-15 NOTE — Telephone Encounter (Signed)
Noted from clinical staff. Nothing further needed

## 2022-04-20 ENCOUNTER — Other Ambulatory Visit: Payer: Self-pay

## 2022-04-20 MED ORDER — TRELEGY ELLIPTA 200-62.5-25 MCG/ACT IN AEPB: 1.0000 | INHALATION_SPRAY | Freq: Every day | RESPIRATORY_TRACT | 11 refills | Status: DC

## 2022-05-25 ENCOUNTER — Other Ambulatory Visit: Payer: Self-pay | Admitting: Internal Medicine

## 2022-05-25 DIAGNOSIS — J302 Other seasonal allergic rhinitis: Secondary | ICD-10-CM

## 2022-05-30 ENCOUNTER — Other Ambulatory Visit: Payer: Medicare Other

## 2022-05-31 ENCOUNTER — Encounter: Payer: Medicare Other | Attending: Internal Medicine

## 2022-05-31 DIAGNOSIS — E7800 Pure hypercholesterolemia, unspecified: Secondary | ICD-10-CM

## 2022-05-31 DIAGNOSIS — Z79899 Other long term (current) drug therapy: Secondary | ICD-10-CM

## 2022-05-31 DIAGNOSIS — I2510 Atherosclerotic heart disease of native coronary artery without angina pectoris: Secondary | ICD-10-CM

## 2022-05-31 DIAGNOSIS — I2584 Coronary atherosclerosis due to calcified coronary lesion: Secondary | ICD-10-CM | POA: Insufficient documentation

## 2022-05-31 LAB — LIPID PANEL
Chol/HDL Ratio: 3.9 ratio (ref 0.0–4.4)
Cholesterol, Total: 152 mg/dL (ref 100–199)
HDL: 39 mg/dL — ABNORMAL LOW (ref >39)
LDL Chol Calc (NIH): 87 mg/dL (ref 0–99)
Triglycerides: 150 mg/dL — ABNORMAL HIGH (ref 0–149)
VLDL Cholesterol Cal: 26 mg/dL (ref 5–40)

## 2022-06-01 ENCOUNTER — Telehealth: Payer: Self-pay | Admitting: *Deleted

## 2022-06-01 ENCOUNTER — Encounter: Payer: Self-pay | Admitting: *Deleted

## 2022-06-01 DIAGNOSIS — Z79899 Other long term (current) drug therapy: Secondary | ICD-10-CM

## 2022-06-01 DIAGNOSIS — I700 Atherosclerosis of aorta: Secondary | ICD-10-CM

## 2022-06-01 DIAGNOSIS — E7800 Pure hypercholesterolemia, unspecified: Secondary | ICD-10-CM

## 2022-06-01 DIAGNOSIS — E782 Mixed hyperlipidemia: Secondary | ICD-10-CM

## 2022-06-01 DIAGNOSIS — I2510 Atherosclerotic heart disease of native coronary artery without angina pectoris: Secondary | ICD-10-CM

## 2022-06-01 MED ORDER — ROSUVASTATIN CALCIUM 20 MG PO TABS: 20.0000 mg | ORAL_TABLET | Freq: Every day | ORAL | 1 refills | Status: DC

## 2022-06-01 NOTE — Telephone Encounter (Signed)
-----   Message from Freada Bergeron, MD sent at 05/31/2022  8:33 PM EST ----- Her LDL cholesterol increased. Has she been taking the lipitor '20mg'$  daily? If so, we should probably trial changing her to crestor '20mg'$  daily and repeat her cholesterol in 8 weeks to see if it improves.

## 2022-06-01 NOTE — Telephone Encounter (Signed)
The patient has been notified of the result and verbalized understanding.  All questions (if any) were answered.  Pt states she is taking her atorvastatin everyday with no skipped doses.   Pt aware that we will now stop her atorvastatin and switch her to crestor 20 mg po daily and have her come back in for repeat lipids in 8 weeks, on this regimen.  Confirmed the pharmacy of choice with the pt.  Scheduled the pt for repeat lipids in 8 weeks on 07/27/22.  She is aware to come fasting to this lab appt.   Pt verbalized understanding and agrees with this plan.

## 2022-06-30 ENCOUNTER — Telehealth (HOSPITAL_COMMUNITY): Payer: Self-pay

## 2022-06-30 NOTE — Telephone Encounter (Signed)
Returned call from [place]. She was seeing if I could send her past records to the [facility] near her. I told her that her chart has been sent down to medical records and that I could not retrieve her chart. She understood. I told her to call me back if they needed me to fax anything specifically.

## 2022-07-27 ENCOUNTER — Encounter: Payer: Medicare Other | Attending: Internal Medicine

## 2022-07-27 DIAGNOSIS — Z79899 Other long term (current) drug therapy: Secondary | ICD-10-CM | POA: Insufficient documentation

## 2022-07-27 DIAGNOSIS — I2584 Coronary atherosclerosis due to calcified coronary lesion: Secondary | ICD-10-CM | POA: Insufficient documentation

## 2022-07-27 DIAGNOSIS — E782 Mixed hyperlipidemia: Secondary | ICD-10-CM | POA: Insufficient documentation

## 2022-07-27 DIAGNOSIS — E7800 Pure hypercholesterolemia, unspecified: Secondary | ICD-10-CM | POA: Insufficient documentation

## 2022-07-27 DIAGNOSIS — I700 Atherosclerosis of aorta: Secondary | ICD-10-CM | POA: Insufficient documentation

## 2022-07-27 DIAGNOSIS — I2510 Atherosclerotic heart disease of native coronary artery without angina pectoris: Secondary | ICD-10-CM | POA: Insufficient documentation

## 2022-07-27 LAB — LIPID PANEL
Chol/HDL Ratio: 3 ratio (ref 0.0–4.4)
Cholesterol, Total: 118 mg/dL (ref 100–199)
HDL: 40 mg/dL (ref >39)
LDL Chol Calc (NIH): 51 mg/dL (ref 0–99)
Triglycerides: 158 mg/dL — ABNORMAL HIGH (ref 0–149)
VLDL Cholesterol Cal: 27 mg/dL (ref 5–40)

## 2022-08-15 ENCOUNTER — Ambulatory Visit (HOSPITAL_COMMUNITY)
Admission: RE | Admit: 2022-08-15 | Discharge: 2022-08-15 | Disposition: A | Discharge status: Home / Self Care | Payer: Medicare Other | Source: Ambulatory Visit | Attending: Internal Medicine | Admitting: Internal Medicine

## 2022-08-15 ENCOUNTER — Encounter (HOSPITAL_COMMUNITY): Payer: Self-pay

## 2022-08-15 DIAGNOSIS — R911 Solitary pulmonary nodule: Secondary | ICD-10-CM | POA: Insufficient documentation

## 2022-08-18 ENCOUNTER — Telehealth: Payer: Self-pay

## 2022-08-18 ENCOUNTER — Encounter: Payer: Self-pay | Admitting: Internal Medicine

## 2022-08-18 ENCOUNTER — Ambulatory Visit (INDEPENDENT_AMBULATORY_CARE_PROVIDER_SITE_OTHER): Payer: Medicare Other | Admitting: Internal Medicine

## 2022-08-18 VITALS — BP 130/76 | HR 68 | Temp 98.1°F | Ht 61.0 in | Wt 189.6 lb

## 2022-08-18 DIAGNOSIS — J9611 Chronic respiratory failure with hypoxia: Secondary | ICD-10-CM

## 2022-08-18 DIAGNOSIS — R918 Other nonspecific abnormal finding of lung field: Secondary | ICD-10-CM | POA: Diagnosis not present

## 2022-08-18 DIAGNOSIS — J4489 Other specified chronic obstructive pulmonary disease: Secondary | ICD-10-CM

## 2022-08-18 DIAGNOSIS — J439 Emphysema, unspecified: Secondary | ICD-10-CM

## 2022-08-18 DIAGNOSIS — J9612 Chronic respiratory failure with hypercapnia: Secondary | ICD-10-CM

## 2022-08-18 DIAGNOSIS — G4733 Obstructive sleep apnea (adult) (pediatric): Secondary | ICD-10-CM | POA: Diagnosis not present

## 2022-08-18 NOTE — Progress Notes (Signed)
Amanda Garrett    [id number]    [birth year]  Primary Care Physician:Elkins, Curt Jews, MD Date of Appointment: 08/18/2022 Established Patient Visit  Chief complaint:   Chief Complaint  Patient presents with   Follow-up    No c/o      HPI: Amanda Garrett is a 68 y.o. woman with COPD and chronic bronchitis and chronic respiratory failure on [facility] with nocturnal bipap.  Interval Updates: Here for COPD follow up.  No interval hospitalizations or ED visits.  Went to pulmonary rehab and noticed a dramatic improvement in her dyspnea. She really enjoyed this.  Still on once daily trelegy. Inquiring about financial assistance.  Minimal albuterol use.   Seasonal allergic rhinitis improved with medications.   Taking trelegy once a day. Minimal albuterol use.   Here with her daughter.    Past Medical History:  Diagnosis Date   Asthma    COPD (chronic obstructive pulmonary disease) ([place])    Diabetes mellitus without complication (HCC)    Emphysema of lung ([place])    GERD (gastroesophageal reflux disease)    Hypercholesteremia 2011   Hypertension    Lichen sclerosus et atrophicus    vulva   Oxygen deficiency    Palpitation    on Inderal    Past Surgical History:  Procedure Laterality Date   BIOPSY  03/01/2021   Procedure: BIOPSY;  Surgeon: Yetta Flock, MD;  [facility];  Service: Gastroenterology;;   [place]     COLONOSCOPY WITH PROPOFOL N/A 03/01/2021   Procedure: COLONOSCOPY WITH PROPOFOL;  Surgeon: Yetta Flock, MD;  [facility];  Service: Gastroenterology;  Laterality: N/A;   DILATION AND CURETTAGE, DIAGNOSTIC / THERAPEUTIC  05/2010   hysteroscopy, polypectomy   ESOPHAGOGASTRODUODENOSCOPY (EGD) WITH PROPOFOL N/A 03/01/2021   Procedure: ESOPHAGOGASTRODUODENOSCOPY (EGD) WITH PROPOFOL;  Surgeon: Yetta Flock, MD;  [facility];  Service: Gastroenterology;   Laterality: N/A;   HEMOSTASIS CLIP PLACEMENT  03/01/2021   Procedure: HEMOSTASIS CLIP PLACEMENT;  Surgeon: Yetta Flock, MD;  [facility];  Service: Gastroenterology;;   KNEE ARTHROSCOPY Right 2013   POLYPECTOMY  03/01/2021   Procedure: POLYPECTOMY;  Surgeon: Yetta Flock, MD;  [facility];  Service: Gastroenterology;;   [place]   dr Pearline Cables    Family History  Problem Relation Age of Onset   Colon polyps Mother    Diabetes Mother    Diverticulitis Mother    Diabetes Father    Diabetes Maternal Grandfather    Hypertension Maternal Grandfather    Breast cancer Paternal Grandmother    Ulcerative colitis Daughter    COPD Neg Hx    Lung cancer Neg Hx    Colon cancer Neg Hx    Esophageal cancer Neg Hx    Pancreatic cancer Neg Hx    Stomach cancer Neg Hx    Rectal cancer Neg Hx     Social History   Occupational History   Not on file  Tobacco Use   Smoking status: Former    Packs/day: 2.00    Years: 34.00    Total pack years: 68.00    Types: Cigarettes    Start date: 3    Quit date: 06/1996    Years since quitting: 26.1   Smokeless tobacco: Never  Vaping Use   Vaping Use: Never used  Substance  and Sexual Activity   Alcohol use: Yes    Comment: rare   Drug use: Never   Sexual activity: Not on file     Physical Exam: Blood pressure 130/76, pulse 68, temperature 98.1 F (36.7 C), temperature source Oral, height '5\' 1"'$  (1.549 m), weight 189 lb 9.6 oz (86 kg), SpO2 98 %.  Gen:    On nasal cannula Lungs:   no increased work of breathing, no wheezes or crackles.  CV:         RRR no mrg   Data Reviewed: Imaging: I have personally reviewed the CT Chest March 2024 which shows resolution of RUL nodules and 55m nodule in LUL.   PFTs:     Latest Ref Rng & Units 12/02/2020    3:50 PM  PFT Results  FVC-Pre L 1.29   FVC-Predicted Pre % 47   FVC-Post L 1.28   FVC-Predicted Post % 46   Pre  FEV1/FVC % % 55   Post FEV1/FCV % % 58   FEV1-Pre L 0.70   FEV1-Predicted Pre % 33   FEV1-Post L 0.75   DLCO uncorrected ml/min/mmHg 13.56   DLCO UNC% % 76   DLCO corrected ml/min/mmHg 13.65   DLCO COR %Predicted % 76   DLVA Predicted % 113   TLC L 4.19   TLC % Predicted % 90   RV % Predicted % 139    I have personally reviewed the patient's PFTs and they show very severe airflow limitation.   Labs: ABG shows hypercapnia - PCO2 65 mm Hg  Immunization status: Immunization History  Administered Date(s) Administered   Influenza-Unspecified 04/06/2022   PFIZER(Purple Top)SARS-COV-2 Vaccination 08/15/2019, 09/11/2019, 03/02/2020    Assessment:  Very Severe COPD FEV1 33% of predicted Chronic hypoxemic and hypercapnic respiratory failure multiple pulmonary nodules - resolved, no further follow up needed OSA on BIPAP  Plan/Recommendations:  Your oxygen levels look great during the day. You probably don't need oxygen during the day for simple activities. Keep an eye on your oxygen levels at home to make sure you are staying   Continue nighttime bipap with oxygen as you are doing.   Continue trelegy, gargle after use and albuterol.   Your CT scan looks improved from previous - the small cluster of nodules was probably infection that has resolved.   She is outside the window for lung cancer screening, quit in 1998.   Return to Care: Return in about 6 months (around 02/18/2023).   NLenice Llamas MD Pulmonary and [place]

## 2022-08-18 NOTE — Telephone Encounter (Signed)
Dr. [name] pt was wanting to know if there was anything to talk about r/t to the new nodule found on must recent CT scan. Pt apologized for forgetting to ask you about it in OV. Please advise and return to [place]. Thank you

## 2022-08-18 NOTE — Patient Instructions (Addendum)
Please schedule follow up scheduled with myself in 6 months.  If my schedule is not open yet, we will contact you with a reminder closer to that time. Please call [phone] if you haven't heard from [place] a month before.   Your oxygen levels look great during the day. You probably don't need oxygen during the day for simple activities. Keep an eye on your oxygen levels at home to make sure you are staying   Continue nighttime bipap with oxygen as you are doing.   Continue trelegy, gargle after use and albuterol.   Your CT scan looks improved from previous - the small cluster of nodules was probably infection that has resolved.

## 2022-08-19 NOTE — Telephone Encounter (Signed)
Called and left detailed message for patient about Dr [name] recommendations. Nothing further needed

## 2022-08-19 NOTE — Telephone Encounter (Signed)
No follow up needed for the 81m pulmonary nodule. This is very small. Her previous nodules resolved on this scan so probably infection. She has several other stable nodules that are not likely to grow or be cancer. No further scans for now.

## 2022-09-21 ENCOUNTER — Telehealth: Payer: Self-pay | Admitting: Internal Medicine

## 2022-09-21 NOTE — Telephone Encounter (Signed)
Pt dropped off paperwork for Dr. Celine Mans

## 2022-09-22 MED ORDER — TRELEGY ELLIPTA 200-62.5-25 MCG/ACT IN AEPB: 1.0000 | INHALATION_SPRAY | Freq: Every day | RESPIRATORY_TRACT | 3 refills | Status: DC

## 2022-09-22 NOTE — Telephone Encounter (Signed)
Dr. Celine Mans, please advise if nurse gave you the paperwork today that pt dropped off.

## 2022-09-22 NOTE — Telephone Encounter (Signed)
Everything already filled out Just need signature on 90 day rx for trelegy to go with it   Dr Celine Mans- I printed the rx and placed back in your cabinet in A pod. Let me know when you sign and I will take care of faxing, thanks!

## 2022-09-26 NOTE — Telephone Encounter (Signed)
Thanks, its signed. Marchelle Folks will fax it.

## 2022-09-26 NOTE — Telephone Encounter (Signed)
Pt assistance faxed to [employer] and confirmation received. Nothing further needed at this time.

## 2022-09-28 ENCOUNTER — Other Ambulatory Visit: Payer: Self-pay | Admitting: Family Medicine

## 2022-09-28 DIAGNOSIS — Z1231 Encounter for screening mammogram for malignant neoplasm of breast: Secondary | ICD-10-CM

## 2022-10-28 ENCOUNTER — Telehealth: Payer: Self-pay | Admitting: Internal Medicine

## 2022-10-28 NOTE — Telephone Encounter (Signed)
[facility], Cherylann Ratel calling stating they have fax'd "Annual order" over by fax with no reply.   Please complete and return. I will ask her to refax  Cherylann Ratel is @ [phone] Fax [phone]

## 2022-11-01 NOTE — Telephone Encounter (Signed)
I have this cmn I had to hunt it down I will give to Dr Celine Mans tomorrow to sign

## 2022-11-09 ENCOUNTER — Ambulatory Visit
Admission: RE | Admit: 2022-11-09 | Discharge: 2022-11-09 | Disposition: A | Discharge status: Home / Self Care | Payer: Medicare Other | Source: Ambulatory Visit | Attending: Family Medicine | Admitting: Family Medicine

## 2022-11-09 DIAGNOSIS — Z1231 Encounter for screening mammogram for malignant neoplasm of breast: Secondary | ICD-10-CM

## 2022-11-22 ENCOUNTER — Other Ambulatory Visit: Payer: Self-pay

## 2022-11-22 DIAGNOSIS — I700 Atherosclerosis of aorta: Secondary | ICD-10-CM

## 2022-11-22 DIAGNOSIS — I2510 Atherosclerotic heart disease of native coronary artery without angina pectoris: Secondary | ICD-10-CM

## 2022-11-22 DIAGNOSIS — Z79899 Other long term (current) drug therapy: Secondary | ICD-10-CM

## 2022-11-22 DIAGNOSIS — E782 Mixed hyperlipidemia: Secondary | ICD-10-CM

## 2022-11-22 DIAGNOSIS — E7800 Pure hypercholesterolemia, unspecified: Secondary | ICD-10-CM

## 2022-11-22 MED ORDER — ROSUVASTATIN CALCIUM 20 MG PO TABS: 20.0000 mg | ORAL_TABLET | Freq: Every day | ORAL | 0 refills | Status: DC

## 2022-11-29 ENCOUNTER — Ambulatory Visit: Payer: Medicare Other | Attending: Physician Assistant | Admitting: Physician Assistant

## 2022-11-29 ENCOUNTER — Encounter: Payer: Self-pay | Admitting: Physician Assistant

## 2022-11-29 VITALS — BP 138/86 | HR 76 | Ht 61.0 in | Wt 189.6 lb

## 2022-11-29 DIAGNOSIS — Z79899 Other long term (current) drug therapy: Secondary | ICD-10-CM | POA: Insufficient documentation

## 2022-11-29 DIAGNOSIS — I10 Essential (primary) hypertension: Secondary | ICD-10-CM | POA: Insufficient documentation

## 2022-11-29 DIAGNOSIS — J449 Chronic obstructive pulmonary disease, unspecified: Secondary | ICD-10-CM | POA: Insufficient documentation

## 2022-11-29 DIAGNOSIS — I2510 Atherosclerotic heart disease of native coronary artery without angina pectoris: Secondary | ICD-10-CM | POA: Diagnosis present

## 2022-11-29 DIAGNOSIS — I2584 Coronary atherosclerosis due to calcified coronary lesion: Secondary | ICD-10-CM | POA: Diagnosis present

## 2022-11-29 DIAGNOSIS — R002 Palpitations: Secondary | ICD-10-CM | POA: Insufficient documentation

## 2022-11-29 DIAGNOSIS — I700 Atherosclerosis of aorta: Secondary | ICD-10-CM | POA: Diagnosis present

## 2022-11-29 DIAGNOSIS — R072 Precordial pain: Secondary | ICD-10-CM | POA: Diagnosis present

## 2022-11-29 DIAGNOSIS — E785 Hyperlipidemia, unspecified: Secondary | ICD-10-CM | POA: Insufficient documentation

## 2022-11-29 NOTE — Patient Instructions (Signed)
Medication Instructions:  Your physician recommends that you continue on your current medications as directed. Please refer to the Current Medication list given to you today.  *If you need a refill on your cardiac medications before your next appointment, please call your pharmacy*  Lab Work: BMET-TODAY If you have labs (blood work) drawn today and your tests are completely normal, you will receive your results only by: MyChart Message (if you have MyChart) OR A paper copy in the mail If you have any lab test that is abnormal or we need to change your treatment, we will call you to review the results.  Follow-Up: At [facility], you and your health needs are our priority.  As part of our continuing mission to provide you with exceptional heart care, we have created designated Provider Care Teams.  These Care Teams include your primary Cardiologist (physician) and Advanced Practice Providers (APPs -  Physician Assistants and Nurse Practitioners) who all work together to provide you with the care you need, when you need it.  Your next appointment:   6 month(s)  Provider:   Dr Jacques Navy  Other Instructions Check your blood pressure daily, 1 hr after morning medications for 2 weeks, keep a log and send [place] the readings through mychart at the end of the 2 weeks.  Low-Sodium Eating Plan Salt (sodium) helps you keep a healthy balance of fluids in your body. Too much sodium can raise your blood pressure. It can also cause fluid and waste to be held in your body. Your health care provider or dietitian may recommend a low-sodium eating plan if you have high blood pressure (hypertension), kidney disease, liver disease, or heart failure. Eating less sodium can help lower your blood pressure and reduce swelling. It can also protect your heart, liver, and kidneys. What are tips for following this plan? Reading food labels  Check food labels for the amount of sodium per serving. If you eat more  than one serving, you must multiply the listed amount by the number of servings. Choose foods with less than 140 milligrams (mg) of sodium per serving. Avoid foods with 300 mg of sodium or more per serving. Always check how much sodium is in a product, even if the label says "unsalted" or "no salt added." Shopping  Buy products labeled as "low-sodium" or "no salt added." Buy fresh foods. Avoid canned foods and pre-made or frozen meals. Avoid canned, cured, or processed meats. Buy breads that have less than 80 mg of sodium per slice. Cooking  Eat more home-cooked food. Try to eat less restaurant, buffet, and fast food. Try not to add salt when you cook. Use salt-free seasonings or herbs instead of table salt or sea salt. Check with your provider or pharmacist before using salt substitutes. Cook with plant-based oils, such as canola, sunflower, or olive oil. Meal planning When eating at a restaurant, ask if your food can be made with less salt or no salt. Avoid dishes labeled as brined, pickled, cured, or smoked. Avoid dishes made with soy sauce, miso, or teriyaki sauce. Avoid foods that have monosodium glutamate (MSG) in them. MSG may be added to some restaurant food, sauces, soups, bouillon, and canned foods. Make meals that can be grilled, baked, poached, roasted, or steamed. These are often made with less sodium. General information Try to limit your sodium intake to 1,500-2,300 mg each day, or the amount told by your provider. What foods should I eat? Fruits Fresh, frozen, or canned fruit. Fruit  juice. Vegetables Fresh or frozen vegetables. "No salt added" canned vegetables. "No salt added" tomato sauce and paste. Low-sodium or reduced-sodium tomato and vegetable juice. Grains Low-sodium cereals, such as oats, puffed wheat and rice, and shredded wheat. Low-sodium crackers. Unsalted rice. Unsalted pasta. Low-sodium bread. Whole grain breads and whole grain pasta. Meats and other  proteins Fresh or frozen meat, poultry, seafood, and fish. These should have no added salt. Low-sodium canned tuna and salmon. Unsalted nuts. Dried peas, beans, and lentils without added salt. Unsalted canned beans. Eggs. Unsalted nut butters. Dairy Milk. Soy milk. Cheese that is naturally low in sodium, such as ricotta cheese, fresh mozzarella, or Swiss cheese. Low-sodium or reduced-sodium cheese. Cream cheese. Yogurt. Seasonings and condiments Fresh and dried herbs and spices. Salt-free seasonings. Low-sodium mustard and ketchup. Sodium-free salad dressing. Sodium-free light mayonnaise. Fresh or refrigerated horseradish. Lemon juice. Vinegar. Other foods Homemade, reduced-sodium, or low-sodium soups. Unsalted popcorn and pretzels. Low-salt or salt-free chips. The items listed above may not be all the foods and drinks you can have. Talk to a dietitian to learn more. What foods should I avoid? Vegetables Sauerkraut, pickled vegetables, and relishes. Olives. [place] fries. Onion rings. Regular canned vegetables, except low-sodium or reduced-sodium items. Regular canned tomato sauce and paste. Regular tomato and vegetable juice. Frozen vegetables in sauces. Grains Instant hot cereals. Bread stuffing, pancake, and biscuit mixes. Croutons. Seasoned rice or pasta mixes. Noodle soup cups. Boxed or frozen macaroni and cheese. Regular salted crackers. Self-rising flour. Meats and other proteins Meat or fish that is salted, canned, smoked, spiced, or pickled. Precooked or cured meat, such as sausages or meat loaves. Tomasa Blase. Ham. Pepperoni. Hot dogs. Corned beef. Chipped beef. Salt pork. Jerky. Pickled herring, anchovies, and sardines. Regular canned tuna. Salted nuts. Dairy Processed cheese and cheese spreads. Hard cheeses. Cheese curds. Blue cheese. Feta cheese. String cheese. Regular cottage cheese. Buttermilk. Canned milk. Fats and oils Salted butter. Regular margarine. Ghee. Bacon fat. Seasonings and  condiments Onion salt, garlic salt, seasoned salt, table salt, and sea salt. Canned and packaged gravies. Worcestershire sauce. Tartar sauce. Barbecue sauce. Teriyaki sauce. Soy sauce, including reduced-sodium soy sauce. Steak sauce. Fish sauce. Oyster sauce. Cocktail sauce. Horseradish that you find on the shelf. Regular ketchup and mustard. Meat flavorings and tenderizers. Bouillon cubes. Hot sauce. Pre-made or packaged marinades. Pre-made or packaged taco seasonings. Relishes. Regular salad dressings. Salsa. Other foods Salted popcorn and pretzels. Corn chips and puffs. Potato and tortilla chips. Canned or dried soups. Pizza. Frozen entrees and pot pies. The items listed above may not be all the foods and drinks you should avoid. Talk to a dietitian to learn more. This information is not intended to replace advice given to you by your health care provider. Make sure you discuss any questions you have with your health care provider. Document Revised: 06/16/2022 Document Reviewed: 06/16/2022 Elsevier Patient Education  2024 [employer]   Heart-Healthy Eating Plan Many factors influence your heart health, including eating and exercise habits. Heart health is also called coronary health. Coronary risk increases with abnormal blood fat (lipid) levels. A heart-healthy eating plan includes limiting unhealthy fats, increasing healthy fats, limiting salt (sodium) intake, and making other diet and lifestyle changes. What is my plan? Your health care provider may recommend that: You limit your fat intake to _________% or less of your total calories each day. You limit your saturated fat intake to _________% or less of your total calories each day. You limit the amount of cholesterol in your  diet to less than _________ mg per day. You limit the amount of sodium in your diet to less than _________ mg per day. What are tips for following this plan? Cooking Cook foods using methods other than frying.  Baking, boiling, grilling, and broiling are all good options. Other ways to reduce fat include: Removing the skin from poultry. Removing all visible fats from meats. Steaming vegetables in water or broth. Meal planning  At meals, imagine dividing your plate into fourths: Fill one-half of your plate with vegetables and green salads. Fill one-fourth of your plate with whole grains. Fill one-fourth of your plate with lean protein foods. Eat 2-4 cups of vegetables per day. One cup of vegetables equals 1 cup (91 g) broccoli or cauliflower florets, 2 medium carrots, 1 large bell pepper, 1 large sweet potato, 1 large tomato, 1 medium white potato, 2 cups (150 g) raw leafy greens. Eat 1-2 cups of fruit per day. One cup of fruit equals 1 small apple, 1 large banana, 1 cup (237 g) mixed fruit, 1 large orange,  cup (82 g) dried fruit, 1 cup (240 mL) 100% fruit juice. Eat more foods that contain soluble fiber. Examples include apples, broccoli, carrots, beans, peas, and barley. Aim to get 25-30 g of fiber per day. Increase your consumption of legumes, nuts, and seeds to 4-5 servings per week. One serving of dried beans or legumes equals  cup (90 g) cooked, 1 serving of nuts is  oz (12 almonds, 24 pistachios, or 7 walnut halves), and 1 serving of seeds equals  oz (8 g). Fats Choose healthy fats more often. Choose monounsaturated and polyunsaturated fats, such as olive and canola oils, avocado oil, flaxseeds, walnuts, almonds, and seeds. Eat more omega-3 fats. Choose salmon, mackerel, sardines, tuna, flaxseed oil, and ground flaxseeds. Aim to eat fish at least 2 times each week. Check food labels carefully to identify foods with trans fats or high amounts of saturated fat. Limit saturated fats. These are found in animal products, such as meats, butter, and cream. Plant sources of saturated fats include palm oil, palm kernel oil, and coconut oil. Avoid foods with partially hydrogenated oils in them. These  contain trans fats. Examples are stick margarine, some tub margarines, cookies, crackers, and other baked goods. Avoid fried foods. General information Eat more home-cooked food and less restaurant, buffet, and fast food. Limit or avoid alcohol. Limit foods that are high in added sugar and simple starches such as foods made using white refined flour (white breads, pastries, sweets). Lose weight if you are overweight. Losing just 5-10% of your body weight can help your overall health and prevent diseases such as diabetes and heart disease. Monitor your sodium intake, especially if you have high blood pressure. Talk with your health care provider about your sodium intake. Try to incorporate more vegetarian meals weekly. What foods should I eat? Fruits All fresh, canned (in natural juice), or frozen fruits. Vegetables Fresh or frozen vegetables (raw, steamed, roasted, or grilled). Green salads. Grains Most grains. Choose whole wheat and whole grains most of the time. Rice and pasta, including brown rice and pastas made with whole wheat. Meats and other proteins Lean, well-trimmed beef, veal, pork, and lamb. Chicken and [place] without skin. All fish and shellfish. Wild duck, rabbit, pheasant, and venison. Egg whites or low-cholesterol egg substitutes. Dried beans, peas, lentils, and tofu. Seeds and most nuts. Dairy Low-fat or nonfat cheeses, including ricotta and mozzarella. Skim or 1% milk (liquid, powdered, or evaporated). Buttermilk  made with low-fat milk. Nonfat or low-fat yogurt. Fats and oils Non-hydrogenated (trans-free) margarines. Vegetable oils, including soybean, sesame, sunflower, olive, avocado, peanut, safflower, corn, canola, and cottonseed. Salad dressings or mayonnaise made with a vegetable oil. Beverages Water (mineral or sparkling). Coffee and tea. Unsweetened ice tea. Diet beverages. Sweets and desserts Sherbet, gelatin, and fruit ice. Small amounts of dark chocolate. Limit  all sweets and desserts. Seasonings and condiments All seasonings and condiments. The items listed above may not be a complete list of foods and beverages you can eat. Contact a dietitian for more options. What foods should I avoid? Fruits Canned fruit in heavy syrup. Fruit in cream or butter sauce. Fried fruit. Limit coconut. Vegetables Vegetables cooked in cheese, cream, or butter sauce. Fried vegetables. Grains Breads made with saturated or trans fats, oils, or whole milk. Croissants. Sweet rolls. Donuts. High-fat crackers, such as cheese crackers and chips. Meats and other proteins Fatty meats, such as hot dogs, ribs, sausage, bacon, rib-eye roast or steak. High-fat deli meats, such as salami and bologna. Caviar. Domestic duck and goose. Organ meats, such as liver. Dairy Cream, sour cream, cream cheese, and creamed cottage cheese. Whole-milk cheeses. Whole or 2% milk (liquid, evaporated, or condensed). Whole buttermilk. Cream sauce or high-fat cheese sauce. Whole-milk yogurt. Fats and oils Meat fat, or shortening. Cocoa butter, hydrogenated oils, palm oil, coconut oil, palm kernel oil. Solid fats and shortenings, including bacon fat, salt pork, lard, and butter. Nondairy cream substitutes. Salad dressings with cheese or sour cream. Beverages Regular sodas and any drinks with added sugar. Sweets and desserts Frosting. Pudding. Cookies. Cakes. Pies. Milk chocolate or white chocolate. Buttered syrups. Full-fat ice cream or ice cream drinks. The items listed above may not be a complete list of foods and beverages to avoid. Contact a dietitian for more information. Summary Heart-healthy meal planning includes limiting unhealthy fats, increasing healthy fats, limiting salt (sodium) intake and making other diet and lifestyle changes. Lose weight if you are overweight. Losing just 5-10% of your body weight can help your overall health and prevent diseases such as diabetes and heart  disease. Focus on eating a balance of foods, including fruits and vegetables, low-fat or nonfat dairy, lean protein, nuts and legumes, whole grains, and heart-healthy oils and fats. This information is not intended to replace advice given to you by your health care provider. Make sure you discuss any questions you have with your health care provider. Document Revised: 07/05/2021 Document Reviewed: 07/05/2021 Elsevier Patient Education  2024 [employer].

## 2022-11-29 NOTE — Progress Notes (Signed)
Cardiology Office Note:  .   Date:  11/29/2022  ID:  Amanda Garrett, DOB 12/25/1955, MRN [id number] PCP: Kaleen Mask, MD  [facility] Providers Cardiologist:  None {   History of Present Illness: .   Amanda Garrett is a 67 y.o. female with a past medical history of diabetes mellitus type 2, COPD, hypertension, and hyperlipidemia who was seen by Dr. Shari Prows in October 2023 for evaluation of elevated heart rates and blood pressure.  Was seen by Lance Bosch, NP 02/17/2022.  Was having both elevated blood pressure and heart rates at pulmonary rehab.  Was started on losartan and referred to cardiology for further workup.  When she saw Dr. Shari Prows back in the fall she was continuing to recover from multiple hospitalizations with bronchitis, chronic 2 L of oxygen nasal cannula.  She had been read only enrolled at pulmonary rehab where she was noted to have elevated blood pressures with exertion as detailed above.  Losartan was started.  Blood pressures were averaging 114-130 systolic.  Patient also endorsed frequent palpitations.  Was currently taking propranolol but had breakthrough symptoms.  No associated lightheadedness to have a dizziness, chest pain, or shortness of breath.  States she could have several episodes in a day.  Also endorsed occasional chest tightness with exertion as well as chronic dyspnea on exertion.  No recent cardiac testing.  Notably had LAD calcium on CT of the chest.  No syncope, orthopnea, PND, peripheral edema, or headaches.  Today, she states that sometimes she can feel the palpitations and they last a few minutes and then they are gone. She has a BP cuff. BMP ordered today. She is doing 2L of oxygen at home but weaning as able. She needs it at night. LDL 51, feb 2025 she will be due for repeat lipid panel. Otherwise doing well from a CV standpoint.  Reports no shortness of breath nor dyspnea on exertion. Reports no chest pain, pressure, or  tightness. No edema, orthopnea, PND. Reports no palpitations.    ROS: Please see pertinent ROS in [facility]  Studies Reviewed: Marland Kitchen        Monitor 03/30/2022   Patch wear time was 3 days and 15 hours   Predominant rhythm was NSR with average HR 73bpm   3 runs of nonsustained VT with longest lasting 4 beats   21 runs of SVT with longest lasting 16 beats   Occasional SVE (2.3%), occasional VE (2.4%)   No sustained arrhythmias or pauses   Patient triggered event correlated with a PVC     Patch Wear Time:  3 days and 15 hours (2023-10-26T17:53:44-0400 to 2023-10-30T09:09:48-0400)   Patient had a min HR of 54 bpm, max HR of 203 bpm, and avg HR of 73 bpm. Predominant underlying rhythm was Sinus Rhythm. 3 Ventricular Tachycardia runs occurred, the run with the fastest interval lasting 4 beats with a max rate of 182 bpm, the longest  lasting 4 beats with an avg rate of 147 bpm. 21 Supraventricular Tachycardia runs occurred, the run with the fastest interval lasting 5 beats with a max rate of 203 bpm, the longest lasting 16 beats with an avg rate of 142 bpm. Isolated SVEs were  occasional (2.3%, 8751), SVE Couplets were rare (<1.0%, 121), and SVE Triplets were rare (<1.0%, 29). Isolated VEs were occasional (2.4%, 9300), VE Couplets were rare (<1.0%, 137), and VE Triplets were rare (<1.0%, 3). Ventricular Bigeminy and Trigeminy  were present.    Physical Exam:  VS:  There were no vitals taken for this visit.   Wt Readings from Last 3 Encounters:  08/18/22 189 lb 9.6 oz (86 kg)  04/14/22 193 lb 9.6 oz (87.8 kg)  04/07/22 194 lb 10.7 oz (88.3 kg)    GEN: Well nourished, well developed in no acute distress NECK: No JVD; No carotid bruits CARDIAC: RRR, no murmurs, rubs, gallops RESPIRATORY:  Clear to auscultation without rales, wheezing or rhonchi  ABDOMEN: Soft, non-tender, non-distended EXTREMITIES:  No edema; No deformity   ASSESSMENT AND PLAN: .   1.  Precordial pain/CAD/aortic  atherosclerosis -None today, continue current medications which include aspirin 81 mg daily, losartan 50 mg daily, propranolol 10 mg daily Crestor 20 mg daily, potassium supplementation -continue heart healthy, low-sodium diet  2.  Palpitations -Still occurring occasionally but overall well-controlled -Continue propranolol 10 mg daily -Heart rate is 76 and normal sinus rhythm today -Continue current medication regimen  3.  Hyperlipidemia -Her LDL was 51 back in February, will be due for repeat lipid panel February 2025 -Triglycerides slightly elevated 158 -Continue current medications which include Crestor 20 mg daily  4.  Chronic obstructive pulmonary disease -she is on a biPAP -sometimes she uses the bathroom in the middle of the night, doesn't sleep well due to this, discussed limiting fluid intake after dinner      Dispo: Follow-up with Dr. Jacques Navy in 6 months  Signed, Sharlene Dory, PA-C

## 2022-11-30 LAB — BASIC METABOLIC PANEL
BUN/Creatinine Ratio: 16 (ref 12–28)
BUN: 12 mg/dL (ref 8–27)
CO2: 23 mmol/L (ref 20–29)
Calcium: 10.1 mg/dL (ref 8.7–10.3)
Chloride: 101 mmol/L (ref 96–106)
Creatinine, Ser: 0.76 mg/dL (ref 0.57–1.00)
Glucose: 93 mg/dL (ref 70–99)
Potassium: 4.7 mmol/L (ref 3.5–5.2)
Sodium: 142 mmol/L (ref 134–144)
eGFR: 85 mL/min/{1.73_m2} (ref >59)

## 2022-12-13 ENCOUNTER — Other Ambulatory Visit: Payer: Self-pay | Admitting: Internal Medicine

## 2022-12-13 DIAGNOSIS — J302 Other seasonal allergic rhinitis: Secondary | ICD-10-CM

## 2022-12-20 ENCOUNTER — Telehealth: Payer: Self-pay | Admitting: Physician Assistant

## 2022-12-20 NOTE — Telephone Encounter (Signed)
  STAT if HR is under 50 or over 120 (normal HR is 60-100 beats per minute)  What is your heart rate?   06/20 146/69 72 06/21 139/78 66 06/22 139/79 67 06/24 134/67 68 06/25 145/70 73 - didn't sleep good 06/26 136/66 71 06/27 138/68 63 06/28 129/64 67 06/29 129/63 65 06/30 123/59 65 07/02 139/68 64 07/03 132/67 64 07/04 132/67 68 07/05 139/67 62 07/06 138/65 66  Do you have a log of your heart rate readings (document readings)?   Do you have any other symptoms? Pt called to give BP readings

## 2022-12-21 MED ORDER — LOSARTAN POTASSIUM 50 MG PO TABS: 75.0000 mg | ORAL_TABLET | Freq: Every day | ORAL | 3 refills | Status: DC

## 2022-12-21 NOTE — Telephone Encounter (Signed)
  I would prefer her BP be a little more controlled. I would like to increase Losartan to 75mg  daily and have her continue to keep track. Please remind her about a low sodium diet.  Thanks! Sharlene Dory, PA-C   Left a message for the pt to call back.

## 2022-12-21 NOTE — Telephone Encounter (Signed)
Pt advised Tessa's recommendations and will let us know how she is doing.

## 2022-12-22 NOTE — Telephone Encounter (Signed)
Spoke to the pt, she called seeking clarification if we sent a new prescription for Losartan 75 mg. Informed the pt  we did sent a new prescription to the pharmacy on file. Advised the patient to contact the pharmacy, pt voiced understanding.

## 2023-01-24 ENCOUNTER — Other Ambulatory Visit: Payer: Self-pay | Admitting: Internal Medicine

## 2023-02-16 ENCOUNTER — Ambulatory Visit (INDEPENDENT_AMBULATORY_CARE_PROVIDER_SITE_OTHER): Payer: Medicare Other | Admitting: Internal Medicine

## 2023-02-16 ENCOUNTER — Encounter: Payer: Self-pay | Admitting: Internal Medicine

## 2023-02-16 VITALS — BP 140/80 | HR 87 | Ht 61.0 in | Wt 185.8 lb

## 2023-02-16 DIAGNOSIS — J9610 Chronic respiratory failure, unspecified whether with hypoxia or hypercapnia: Secondary | ICD-10-CM | POA: Diagnosis not present

## 2023-02-16 DIAGNOSIS — J439 Emphysema, unspecified: Secondary | ICD-10-CM | POA: Diagnosis not present

## 2023-02-16 DIAGNOSIS — G4733 Obstructive sleep apnea (adult) (pediatric): Secondary | ICD-10-CM

## 2023-02-16 DIAGNOSIS — J4489 Other specified chronic obstructive pulmonary disease: Secondary | ICD-10-CM

## 2023-02-16 NOTE — Patient Instructions (Addendum)
It was a pleasure to see you today!  Please schedule follow up scheduled with myself in 6 months.  If my schedule is not open yet, we will contact you with a reminder closer to that time. Please call [phone] if you haven't heard from [place] a month before, and always call us sooner if issues or concerns arise. You can also send [place] a message through MyChart, but but aware that this is not to be used for urgent issues and it may take up to 5-7 days to receive a reply. Please be aware that you will likely be able to view your results before I have a chance to respond to them. Please give [place] 5 business days to respond to any non-urgent results.   Continue the trelegy inhaler 1 puff once daily.  Continue albuterol inhaler as needed. I will prescribe a POC machine to adapt to make you a little more mobile.  Continue wearing bipap at night with 2LNC bleed in.  I gave your flu shot today.  Keep oxygen levels over 88%.

## 2023-02-16 NOTE — Progress Notes (Signed)
Amanda Garrett    [id number]    [birth year]  Primary Care Physician:Elkins, Curly Rim, MD Date of Appointment: 02/16/2023 Established Patient Visit  Chief complaint:   Chief Complaint  Patient presents with   Follow-up    No concerns      HPI: Amanda Garrett is a 67 y.o. woman with COPD and chronic bronchitis and chronic respiratory failure on [facility] with nocturnal bipap. Did pulmonary rehab in 2023.   Interval Updates: Here for COPD follow up.  No interval hospitalizations or ED visits.   Some days doesn't take trelegy and doesn't seem to make a difference. Albuterol helps. Use less than 1-2 times/day.   Went to [employer] with family. Trying to stay active.   Here with her daughter.    Past Medical History:  Diagnosis Date   Asthma    COPD (chronic obstructive pulmonary disease) (HCC)    Diabetes mellitus without complication (HCC)    Emphysema of lung (HCC)    GERD (gastroesophageal reflux disease)    Hypercholesteremia 2011   Hypertension    Lichen sclerosus et atrophicus    vulva   Oxygen deficiency    Palpitation    on Inderal    Past Surgical History:  Procedure Laterality Date   BIOPSY  03/01/2021   Procedure: BIOPSY;  Surgeon: Benancio Deeds, MD;  Location: [facility];  Service: Gastroenterology;;   CESAREAN SECTION     1988, 1990   CHOLECYSTECTOMY     COLONOSCOPY WITH PROPOFOL N/A 03/01/2021   Procedure: COLONOSCOPY WITH PROPOFOL;  Surgeon: Benancio Deeds, MD;  Location: [facility];  Service: Gastroenterology;  Laterality: N/A;   DILATION AND CURETTAGE, DIAGNOSTIC / THERAPEUTIC  05/2010   hysteroscopy, polypectomy   ESOPHAGOGASTRODUODENOSCOPY (EGD) WITH PROPOFOL N/A 03/01/2021   Procedure: ESOPHAGOGASTRODUODENOSCOPY (EGD) WITH PROPOFOL;  Surgeon: Benancio Deeds, MD;  Location: [facility];  Service: Gastroenterology;  Laterality: N/A;   HEMOSTASIS CLIP PLACEMENT  03/01/2021   Procedure: HEMOSTASIS CLIP PLACEMENT;   Surgeon: Benancio Deeds, MD;  Location: [facility];  Service: Gastroenterology;;   KNEE ARTHROSCOPY Right 2013   POLYPECTOMY  03/01/2021   Procedure: POLYPECTOMY;  Surgeon: Benancio Deeds, MD;  Location: [facility];  Service: Gastroenterology;;   REFRACTIVE SURGERY     TUBAL LIGATION  1990   dr Wallace Cullens    Family History  Problem Relation Age of Onset   Colon polyps Mother    Diabetes Mother    Diverticulitis Mother    Diabetes Father    Diabetes Maternal Grandfather    Hypertension Maternal Grandfather    Breast cancer Paternal Grandmother    Ulcerative colitis Daughter    COPD Neg Hx    Lung cancer Neg Hx    Colon cancer Neg Hx    Esophageal cancer Neg Hx    Pancreatic cancer Neg Hx    Stomach cancer Neg Hx    Rectal cancer Neg Hx     Social History   Occupational History   Not on file  Tobacco Use   Smoking status: Former    Current packs/day: 0.00    Average packs/day: 2.0 packs/day for 34.0 years (68.0 ttl pk-yrs)    Types: Cigarettes    Start date: 62    Quit date: 06/1996    Years since quitting: 26.6   Smokeless tobacco: Never  Vaping Use   Vaping status: Never Used  Substance and Sexual Activity   Alcohol use: Yes  Comment: rare   Drug use: Never   Sexual activity: Not on file     Physical Exam: Blood pressure (!) 140/80, pulse 87, height 5\' 1"  (1.549 m), weight 185 lb 12.8 oz (84.3 kg), SpO2 96%.  Gen:    On nasal cannula Lungs:   diminished, clear CV:         RRR no mrg, no pedal edema   Data Reviewed: Imaging: I have personally reviewed the CT Chest March 2024 which shows resolution of RUL nodules and 3mm nodule in LUL.   PFTs:     Latest Ref Rng & Units 12/02/2020    3:50 PM  PFT Results  FVC-Pre L 1.29   FVC-Predicted Pre % 47   FVC-Post L 1.28   FVC-Predicted Post % 46   Pre FEV1/FVC % % 55   Post FEV1/FCV % % 58   FEV1-Pre L 0.70   FEV1-Predicted Pre % 33   FEV1-Post L 0.75   DLCO uncorrected ml/min/mmHg 13.56    DLCO UNC% % 76   DLCO corrected ml/min/mmHg 13.65   DLCO COR %Predicted % 76   DLVA Predicted % 113   TLC L 4.19   TLC % Predicted % 90   RV % Predicted % 139    I have personally reviewed the patient's PFTs and they show very severe airflow limitation.   Labs: ABG shows hypercapnia - PCO2 65 mm Hg  Immunization status: Immunization History  Administered Date(s) Administered   Influenza-Unspecified 04/06/2022   PFIZER(Purple Top)SARS-COV-2 Vaccination 08/15/2019, 09/11/2019, 03/02/2020    Assessment:  Very Severe COPD FEV1 33% of predicted Chronic hypoxemic and hypercapnic respiratory failure Multiple pulmonary nodules - resolved, no further follow up needed OSA on BIPAP  Plan/Recommendations:  Continue the trelegy inhaler 1 puff once daily.  Continue albuterol inhaler as needed. I will prescribe a POC machine to adapt to make you a little more mobile.  Continue wearing bipap at night with 2LNC bleed in.  I gave your flu shot today.  Keep oxygen levels over 88%.   She is outside the window for lung cancer screening, quit in 1998.   Return to Care: Return in about 6 months (around 08/16/2023).   Durel Salts, MD Pulmonary and Critical Care Medicine [facility] Office:[phone]

## 2023-03-03 ENCOUNTER — Telehealth: Payer: Self-pay | Admitting: Internal Medicine

## 2023-03-03 DIAGNOSIS — J9611 Chronic respiratory failure with hypoxia: Secondary | ICD-10-CM

## 2023-03-03 DIAGNOSIS — J441 Chronic obstructive pulmonary disease with (acute) exacerbation: Secondary | ICD-10-CM

## 2023-03-03 NOTE — Telephone Encounter (Signed)
Patient is calling because she needs an order for her portable oxygen. It has not been received by [employer] [facility] yet.

## 2023-03-10 ENCOUNTER — Other Ambulatory Visit: Payer: Self-pay

## 2023-03-10 DIAGNOSIS — E7800 Pure hypercholesterolemia, unspecified: Secondary | ICD-10-CM

## 2023-03-10 DIAGNOSIS — E782 Mixed hyperlipidemia: Secondary | ICD-10-CM

## 2023-03-10 DIAGNOSIS — I2510 Atherosclerotic heart disease of native coronary artery without angina pectoris: Secondary | ICD-10-CM

## 2023-03-10 DIAGNOSIS — Z79899 Other long term (current) drug therapy: Secondary | ICD-10-CM

## 2023-03-10 DIAGNOSIS — I700 Atherosclerosis of aorta: Secondary | ICD-10-CM

## 2023-03-10 MED ORDER — ROSUVASTATIN CALCIUM 20 MG PO TABS
20.0000 mg | ORAL_TABLET | Freq: Every day | ORAL | 3 refills | Status: DC
Start: 2023-03-10 — End: 2024-05-06

## 2023-03-15 NOTE — Telephone Encounter (Signed)
Order has been placed.

## 2023-03-23 ENCOUNTER — Other Ambulatory Visit: Payer: Self-pay | Admitting: Internal Medicine

## 2023-05-10 ENCOUNTER — Ambulatory Visit: Payer: Medicare Other | Attending: Internal Medicine | Admitting: Internal Medicine

## 2023-05-10 ENCOUNTER — Encounter: Payer: Self-pay | Admitting: Internal Medicine

## 2023-05-10 VITALS — BP 144/68 | HR 65 | Ht 61.0 in | Wt 188.0 lb

## 2023-05-10 DIAGNOSIS — I10 Essential (primary) hypertension: Secondary | ICD-10-CM | POA: Diagnosis not present

## 2023-05-10 DIAGNOSIS — E785 Hyperlipidemia, unspecified: Secondary | ICD-10-CM | POA: Diagnosis not present

## 2023-05-10 DIAGNOSIS — I2510 Atherosclerotic heart disease of native coronary artery without angina pectoris: Secondary | ICD-10-CM | POA: Diagnosis not present

## 2023-05-10 DIAGNOSIS — J449 Chronic obstructive pulmonary disease, unspecified: Secondary | ICD-10-CM | POA: Insufficient documentation

## 2023-05-10 DIAGNOSIS — Z79899 Other long term (current) drug therapy: Secondary | ICD-10-CM | POA: Diagnosis present

## 2023-05-10 DIAGNOSIS — R002 Palpitations: Secondary | ICD-10-CM | POA: Diagnosis present

## 2023-05-10 DIAGNOSIS — I700 Atherosclerosis of aorta: Secondary | ICD-10-CM | POA: Diagnosis present

## 2023-05-10 MED ORDER — AMLODIPINE BESYLATE 5 MG PO TABS: 5.0000 mg | ORAL_TABLET | Freq: Every day | ORAL | 3 refills | Status: AC

## 2023-05-10 NOTE — Patient Instructions (Signed)
Medication Instructions:  Your physician has recommended you make the following change in your medication:   -Start amlodipine (norvasc) 5mg  once daily.  *If you need a refill on your cardiac medications before your next appointment, please call your pharmacy*    Follow-Up: At [facility], you and your health needs are our priority.  As part of our continuing mission to provide you with exceptional heart care, we have created designated Provider Care Teams.  These Care Teams include your primary Cardiologist (physician) and Advanced Practice Providers (APPs -  Physician Assistants and Nurse Practitioners) who all work together to provide you with the care you need, when you need it.  We recommend signing up for the patient portal called "MyChart".  Sign up information is provided on this After Visit Summary.  MyChart is used to connect with patients for Virtual Visits (Telemedicine).  Patients are able to view lab/test results, encounter notes, upcoming appointments, etc.  Non-urgent messages can be sent to your provider as well.   To learn more about what you can do with MyChart, go to [email or web address].    Your next appointment:   4 week(s)  Provider:   Marjie Skiff, PA-C or Azalee Course, PA-C        Then, Weston Brass, MD will plan to see you again in 12 month(s).    Other Instructions Please keep a blood pressure log for the next 4 weeks to review at next office visit.   If you monitor your blood pressure (BP) at home, please bring your BP cuff and your BP readings with you to this appointment  HOW TO TAKE YOUR BLOOD PRESSURE: Rest 5 minutes before taking your blood pressure. Don't smoke or drink caffeinated beverages for at least 30 minutes before. Take your blood pressure before (not after) you eat. Sit comfortably with your back supported and both feet on the floor (don't cross your legs). Elevate your arm to heart level on a table or a desk. Use the  proper sized cuff. It should fit smoothly and snugly around your bare upper arm. There should be enough room to slip a fingertip under the cuff. The bottom edge of the cuff should be 1 inch above the crease of the elbow. Ideally, take 3 measurements at one sitting and record the average.

## 2023-05-10 NOTE — Progress Notes (Addendum)
Cardiology Office Note:  .   Date:  05/10/2023  ID:  Amanda Garrett, DOB May 06, 1956, MRN [id number] PCP: Kaleen Mask, MD  [facility] Providers Cardiologist:  None    History of Present Illness: .   Amanda Garrett is a 67 y.o. female.  Discussed the use of AI scribe software for clinical note transcription with the patient, who gave verbal consent to proceed.  History of Present Illness   The patient, with a history of hypertension, palpitations, and lung disease, presents for a follow-up visit. She reports that her primary care physician recently noted a slightly elevated blood pressure reading of 138 mmHg sys, which was confirmed on a subsequent visit. Today, her blood pressure was 144/68 mmHg. She has been monitoring her blood pressure at home, but did not provide specific readings.  The patient also has a history of palpitations, which she has been managing with propranolol for a long time. She reports that the medication helps to keep her palpitations even, although she still experiences episodes when she gets excited or nervous.  In addition, the patient has a history of lung disease, participated in pulm rehab exercise program. During this program, her blood pressure was consistently high, leading to the up titration of losartan.  The patient also mentions a possible ventral hernia, which was identified during an echocardiogram. She reports discomfort when the area is pressed, but no other symptoms. She has not sought treatment for this issue. Prior pregnancies.        ROS: negative except per HPI above.  Studies Reviewed: Marland Kitchen   EKG Interpretation Date/Time:  Wednesday May 10 2023 15:19:01 EST Ventricular Rate:  65 PR Interval:  122 QRS Duration:  72 QT Interval:  386 QTC Calculation: 401 R Axis:   56  Text Interpretation: Normal sinus rhythm Normal ECG Confirmed by Weston Brass ([id number]) on 05/14/2023 3:11:31 PM    Results   LABS Cholesterol:  Normal (07/2022)   DIAGNOSTIC Echocardiogram: Normal pumping function, normal relaxation, no right heart pressure abnormalities EKG: Normal (05/10/2023)     Risk Assessment/Calculations:    Physical Exam:   VS:  BP (!) 144/68   Pulse 65   Ht 5\' 1"  (1.549 m)   Wt 188 lb (85.3 kg)   SpO2 97%   BMI 35.52 kg/m    Wt Readings from Last 3 Encounters:  05/10/23 188 lb (85.3 kg)  02/16/23 185 lb 12.8 oz (84.3 kg)  11/29/22 189 lb 9.6 oz (86 kg)     Physical Exam   VITALS: BP- 144/68 CHEST: Lungs clear, no wheezing. CARDIOVASCULAR: Heart sounds normal.     GEN: Well nourished, well developed in no acute distress NECK: No JVD; No carotid bruits CARDIAC: RRR, no murmurs, rubs, gallops RESPIRATORY:  Clear to auscultation without rales, wheezing or rhonchi  ABDOMEN: Soft, non-tender, non-distended EXTREMITIES:  No edema; No deformity   ASSESSMENT AND PLAN: .    1. Essential hypertension   2. Hyperlipidemia LDL goal <70   3. Medication management   4. Coronary artery calcification   5. Aortic atherosclerosis (HCC)   6. Palpitations   7. Chronic obstructive pulmonary disease, unspecified COPD type (HCC)     Assessment and Plan    Hypertension Elevated office blood pressure (144/68). Patient has home blood pressure cuff and has reported previous high readings. Currently on Losartan 75mg  daily and Propranolol for palpitations. -Start Amlodipine 5mg  daily. -Advise patient to monitor blood pressure at home and report readings. -Schedule follow-up  appointment in 4 weeks to assess blood pressure control and tolerance of Amlodipine.  Palpitations Chronic issue, currently managed with Propranolol. Patient reports palpitations are mostly controlled, but can increase with emotional stress. -Continue Propranolol as prescribed. -Encourage patient to continue monitoring and report any significant changes in frequency or severity of palpitations.  Hyperlipidemia Well controlled on  Rosuvastatin 20mg  daily. -Continue Rosuvastatin 20mg  daily.  Possible Ventral Hernia vs diastasis recti Patient reports a ventral hernia, likely secondary to previous pregnancies and surgeries. No current pain or discomfort. -No intervention required at this time. Advise patient to report to PCP any changes in size, shape, or if pain develops.  General Health Maintenance / Followup Plans -Schedule follow-up appointment in 1 year with the physician. -Encourage patient to continue regular follow-ups with primary care provider and pulmonologist.             Weston Brass, MD, [facility] [place]  [facility]

## 2023-05-24 NOTE — Progress Notes (Signed)
Cardiology Office Note:    Date:  06/05/2023   ID:  Honest[place], Coppler 1955-09-09, MRN [id number]  PCP:  Kaleen Mask, MD  Cardiologist:  Parke Poisson, MD     Referring MD: Kaleen Mask, *   Chief Complaint: follow-up of hypertension  History of Present Illness:    Amanda Garrett is a 67 y.o. female with a history of mild non-obstructive CAD noted on coronary CTA in 04/2022, palpitations with short runs of NSVT and SVT as well as PVCs noted on monitor in 03/2022, COPD/ asthma on 2L of O2 at home, hypertension, hyperlipidemia, type 2 diabetes mellitus, and GERD who is followed by Dr. Jacques Navy and presents today for follow-up of hypertension.  Patient has a history of COPD and asthma and has had multiple hospitalization over the years for bronchitis. She is now on chronic 2L of O2 via nasal cannula. She was was initially referred to Dr. Shari Prows in 03/2022 for further evaluation of elevated heart rates and BP. She reported frequent palpitations at that time and occasional chest tightness with exertion as well as chronic dyspnea on exertion. Coronary CTA, Echo, and Zio monitor were ordered for further evaluation. Coronary CTA showed a coronary calcium score of 19.1 (57th percentile for age and sex) and mild non-obstructive CAD. Echo showed LVEF of 60-65% with normal wall motion and diastolic parameters and no significant valvular disease.  Zio monitor showed underlying normal rhythm with 3 runs of NSVT (longest run 4 beats) and 21 short runs of SVT (longest run 16 beats) as well as occasional PACs/ PVCs.   He was last seen by Dr. Jacques Navy on 05/10/2023 at which time she continued to report some palpitations, especially when she is excited or nervous, but overall symptoms. BP was mildly elevated and she was started on Amlodipine.   Patient presents today for follow-up. Here alone. Her BP looks much better today.  She brings in a log of her BP and heart rate.  Since  who  started Amlodipine, BP has improved.  Systolic BPs mostly in the 120s to 130s but occasionally in the 140s.  Diastolic BP in the 50s to 60s.  Heart rates in the 60s to 70s.  She is otherwise doing well from a cardiac standpoint.  She denies any chest pain.  She has chronic shortness of breath due to her COPD but this is stable. She wears 2L of O2 at night and as needed during the day.  No orthopnea or PND.  No edema. She does reports some very brief episodes of lightheadedness/ dizziness since adding starting Amlodipine but these do not last long. She denies any palpitations or syncope.  EKGs/Labs/Other Studies Reviewed:    The following studies were reviewed:  Coronary CTA 04/14/2023: Impression: 1. Coronary calcium score of 19.1. This was 57th percentile for age-, sex, and race-matched controls. 2.  Normal coronary origin with right dominance. 3.  Mild atherosclerosis.  CAD RADS 2. 4.  Recommend preventive therapy and risk factor modification. 5.  Consider non atherosclerotic causes of chest pain. _______________  Echocardiogram 04/15/2022: Impression: 1. Left ventricular ejection fraction, by estimation, is 60 to 65%. The  left ventricle has normal function. The left ventricle has no regional  wall motion abnormalities. Left ventricular diastolic parameters were  normal.   2. Right ventricular systolic function is normal. The right ventricular  size is normal. There is normal pulmonary artery systolic pressure.   3. No evidence of mitral valve regurgitation.  4. The aortic valve was not well visualized. Aortic valve regurgitation  is not visualized.   5. The inferior vena cava is normal in size with greater than 50%  respiratory variability, suggesting right atrial pressure of 3 mmHg.   Comparison(s): No prior Echocardiogram.  _______________  Monitor 04/07/2022 to 04/11/2022:   Patch wear time was 3 days and 15 hours   Predominant rhythm was NSR with average HR 73bpm   3 runs  of nonsustained VT with longest lasting 4 beats   21 runs of SVT with longest lasting 16 beats   Occasional SVE (2.3%), occasional VE (2.4%)   No sustained arrhythmias or pauses   Patient triggered event correlated with a PVC - EKG:  EKG not ordered today.   Recent Labs: 11/29/2022: BUN 12; Creatinine, Ser 0.76; Potassium 4.7; Sodium 142  Recent Lipid Panel    Component Value Date/Time   CHOL 118 07/27/2022 0730   TRIG 158 (H) 07/27/2022 0730   HDL 40 07/27/2022 0730   CHOLHDL 3.0 07/27/2022 0730   LDLCALC 51 07/27/2022 0730    Physical Exam:    Vital Signs: BP 126/62   Pulse 74   Ht 5' 1.5" (1.562 m)   Wt 186 lb (84.4 kg)   SpO2 95%   BMI 34.58 kg/m     Wt Readings from Last 3 Encounters:  06/05/23 186 lb (84.4 kg)  05/10/23 188 lb (85.3 kg)  02/16/23 185 lb 12.8 oz (84.3 kg)     General: 67 y.o. Caucasian female in no acute distress. HEENT: Normocephalic and atraumatic. Sclera clear.  Neck: Supple. No No JVD. Heart: RRR. Distinct S1 and S2. No murmurs, gallops, or rubs.  Lungs: No increased work of breathing. Clear to ausculation bilaterally. No wheezes, rhonchi, or rales.  Extremities: No lower extremity edema.   Skin: Warm and dry. Neuro: No focal deficits. Psych: Normal affect. Responds appropriately.  Assessment:    1. Hypertension, unspecified type   2. Coronary artery disease involving native coronary artery of native heart without angina pectoris   3. Palpitations   4. Hyperlipidemia, unspecified hyperlipidemia type   5. Type 2 diabetes mellitus with complication, without long-term current use of insulin (HCC)     Plan:    Hypertension BP well controlled in the office. BP has improved since addition of Amlodipine. Systolic BP mostly in the 120s to 130s at home. - Continue current medications: Amlodipine 5mg  daily, Losartan 50mg  daily, and Propranolol 10mg  twice daily. - She describes some very brief lightheadedness/ dizziness since adding Amlodipine  so will hold off on increasing medications further for now given BP ist mostly at goal. - Advised patient to continue to monitor BP at home and let us know if consistently >130/80.  Non-Obstructive CAD Coronary CTA in 04/2022 showed a coronary calcium score of 19.1 (57th percentile for age and sex) and mild non-obstructive CAD. - No chest pain. - Continue aspirin and statin.  Palpitations Monitor in 03/2022 showed underlying normal rhythm with 3 runs of NSVT (longest run 4 beats) and 21 short runs of SVT (longest run 16 beats) as well as occasional PACs/ PVCs. - Stable. No significant palpitations.  - Continue Propranolol 10mg  twice daily.  Hyperlipidemia Lipid panel in 07/2022: Total Cholesterol 118, Triglycerides 158, HD 40, LDL 51. LDL goal <70 given CAD. - Continue Crestor 20mg  daily.   Type 2 Diabetes Mellitus Hemoglobin A1c 5.8% in 07/2021.  - On Metformin.  - Management per PCP.  Disposition: Follow up in 6  months.    Leanne Lovely, PA-C  06/05/2023 4:47 PM    [facility]

## 2023-06-05 ENCOUNTER — Ambulatory Visit: Payer: Medicare Other | Attending: Student | Admitting: Student

## 2023-06-05 ENCOUNTER — Encounter: Payer: Self-pay | Admitting: Student

## 2023-06-05 VITALS — BP 126/62 | HR 74 | Ht 61.5 in | Wt 186.0 lb

## 2023-06-05 DIAGNOSIS — E785 Hyperlipidemia, unspecified: Secondary | ICD-10-CM | POA: Diagnosis present

## 2023-06-05 DIAGNOSIS — I10 Essential (primary) hypertension: Secondary | ICD-10-CM | POA: Diagnosis not present

## 2023-06-05 DIAGNOSIS — E118 Type 2 diabetes mellitus with unspecified complications: Secondary | ICD-10-CM | POA: Insufficient documentation

## 2023-06-05 DIAGNOSIS — R002 Palpitations: Secondary | ICD-10-CM | POA: Insufficient documentation

## 2023-06-05 DIAGNOSIS — I2510 Atherosclerotic heart disease of native coronary artery without angina pectoris: Secondary | ICD-10-CM | POA: Insufficient documentation

## 2023-06-05 NOTE — Patient Instructions (Signed)
Medication Instructions:  NO CHANGES    Lab Work: NONE   Testing/Procedures: NONE   Follow-Up: At [facility], you and your health needs are our priority.  As part of our continuing mission to provide you with exceptional heart care, we have created designated Provider Care Teams.  These Care Teams include your primary Cardiologist (physician) and Advanced Practice Providers (APPs -  Physician Assistants and Nurse Practitioners) who all work together to provide you with the care you need, when you need it.  We recommend signing up for the patient portal called "MyChart".  Sign up information is provided on this After Visit Summary.  MyChart is used to connect with patients for Virtual Visits (Telemedicine).  Patients are able to view lab/test results, encounter notes, upcoming appointments, etc.  Non-urgent messages can be sent to your provider as well.   To learn more about what you can do with MyChart, go to [email or web address].    Your next appointment:   6 month(s)  Provider:   Marjie Skiff, PA-C OR Parke Poisson, MD will plan to see you again in 6 month(s).

## 2023-11-04 IMAGING — CT CT CHEST W/O CM
2 of 4 series · 15 of 36 positions shown, 18 images · non-contrast
Comparison: Chest CT dated November 09, 2020

CLINICAL DATA: Lung nodule follow-up



[Series 2: thorax · axial · 0.63mm/px · z∈[-398,-112]mm · 12 of 169 slices shown, 15 images]
[im 13/169  mediastinal]
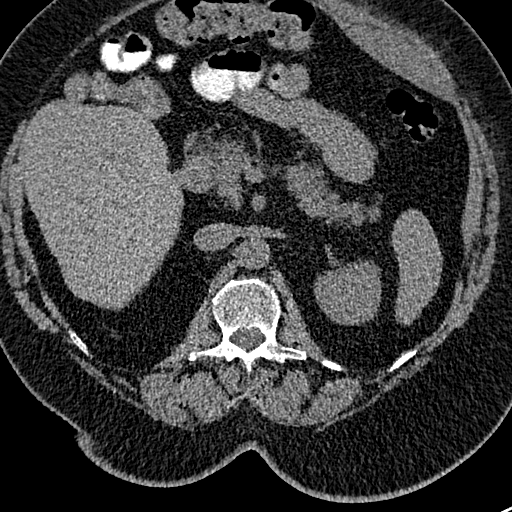
[im 13/169  lung]
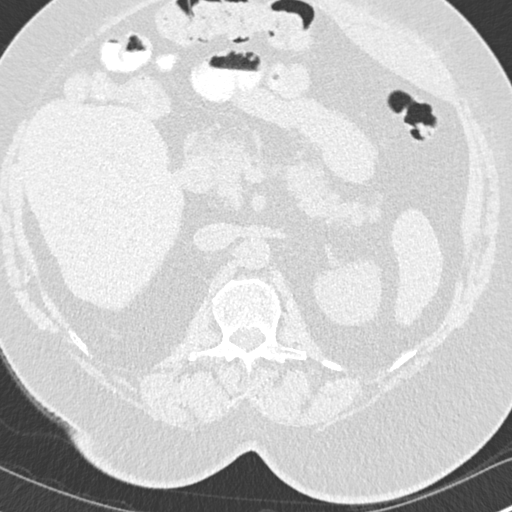
[im 26/169  lung]
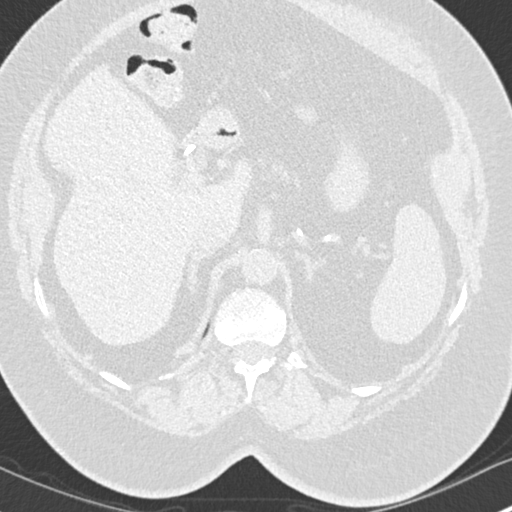
[im 39/169  lung]
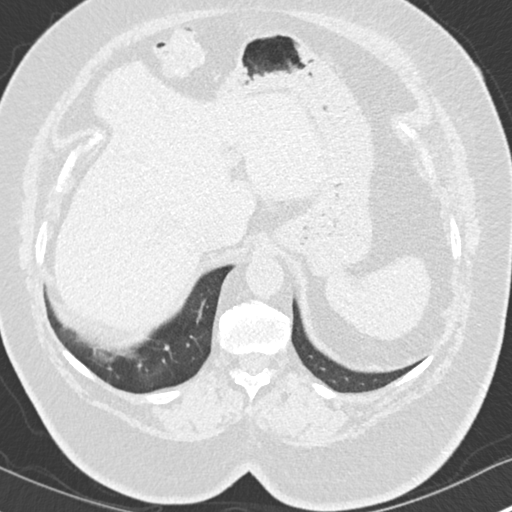
[im 52/169  lung]
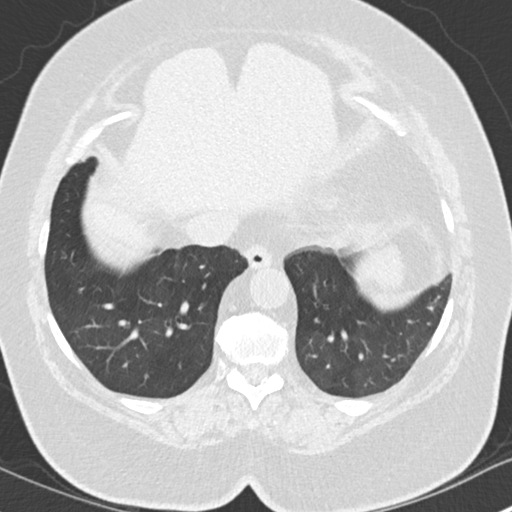
[im 65/169  mediastinal]
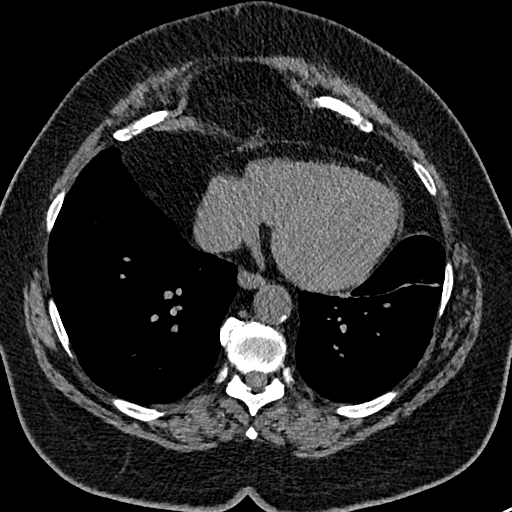
[im 65/169  lung]
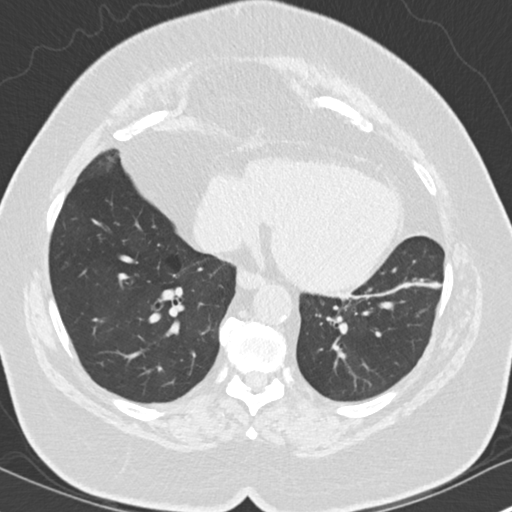
[im 78/169  lung]
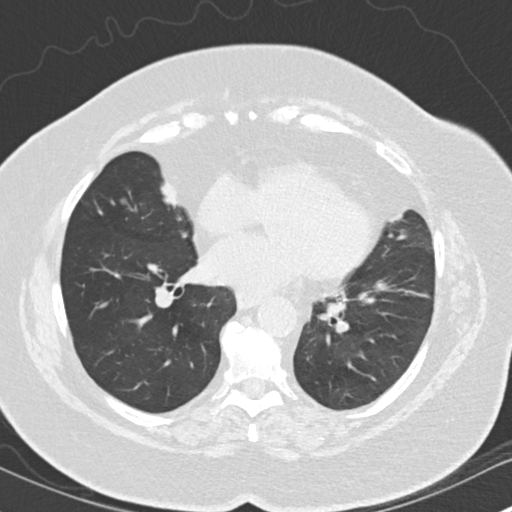
[im 91/169  lung]
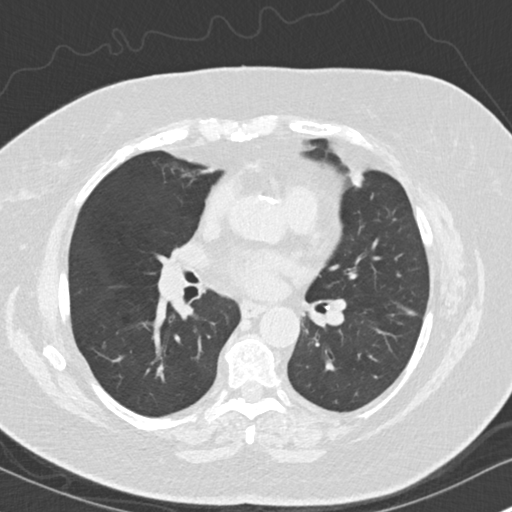
[im 104/169  lung]
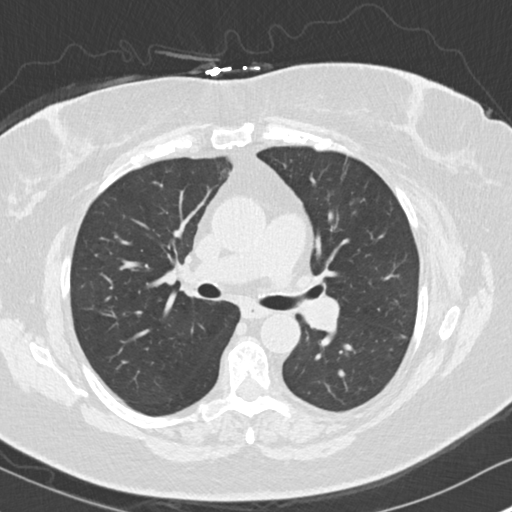
[im 117/169  mediastinal]
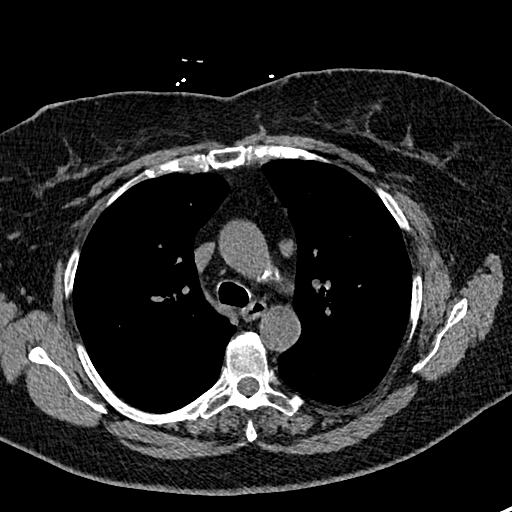
[im 117/169  lung]
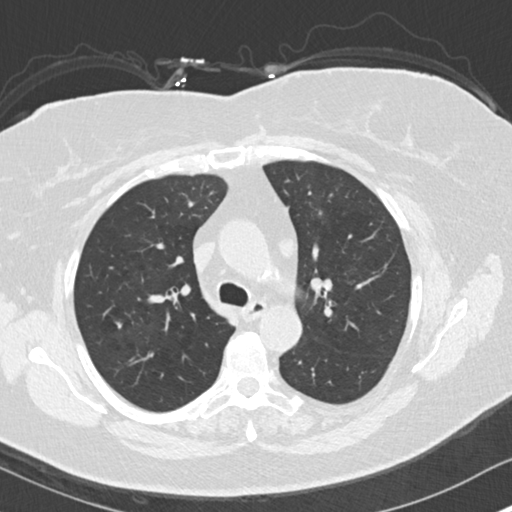
[im 130/169  lung]
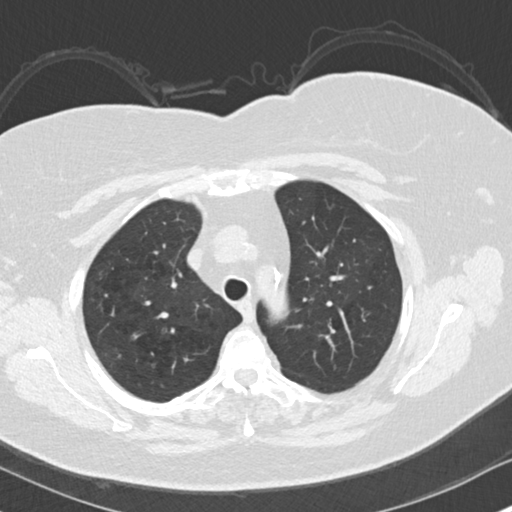
[im 143/169  lung]
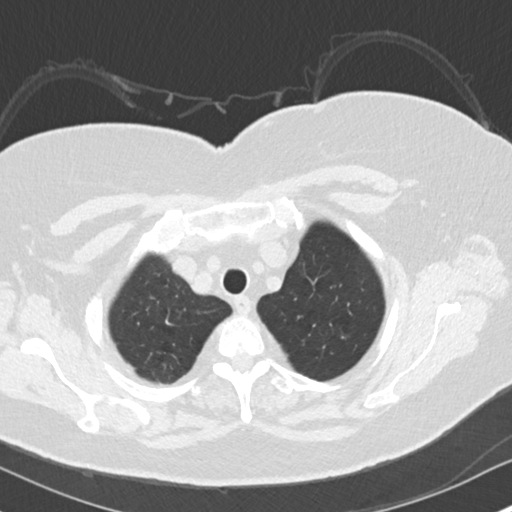
[im 156/169  lung]
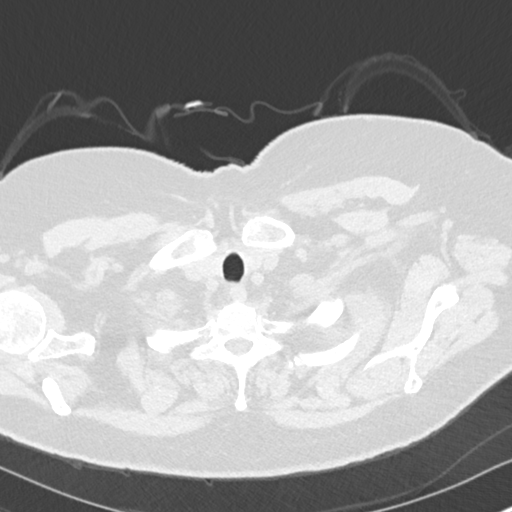

[Series 6: coronal · coronal · 0.67mm/px · 3 of 151 slices shown]
[im 31/151  lung]
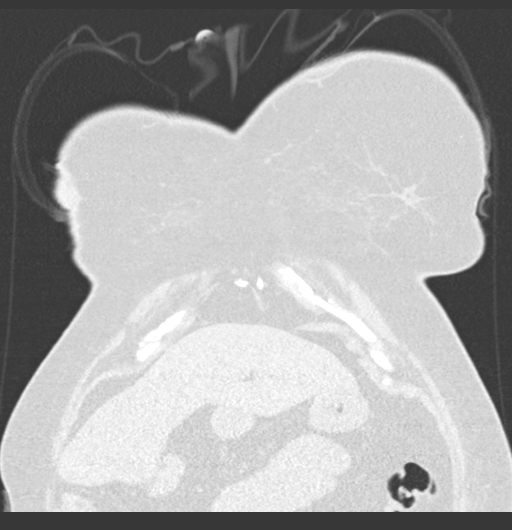
[im 61/151  lung]
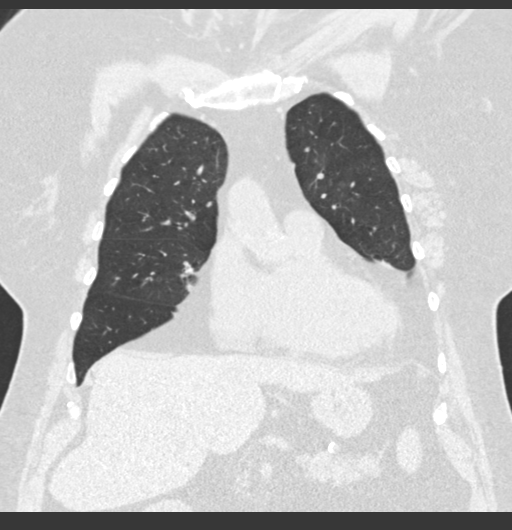
[im 91/151  lung]
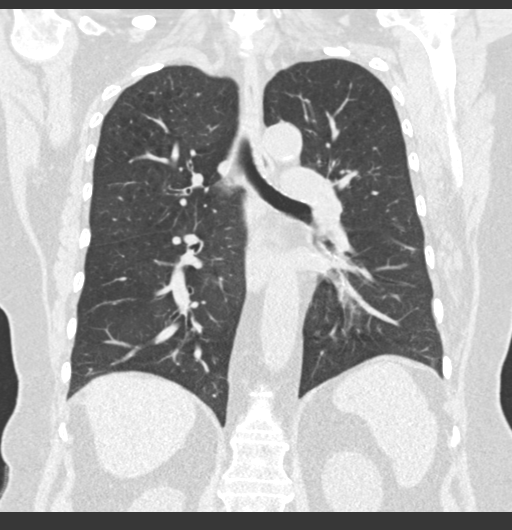

[15 of 36 positions shown; findings below may reference images not displayed]

FINDINGS: Cardiovascular: Normal heart size. No pericardial effusion.
Atherosclerotic disease of the thoracic aorta. Coronary artery
calcifications of the circumflex and LAD.

Mediastinum/Nodes: Prominent pre-vascular lymph node, unchanged
compared to prior exam and likely reactive. No pathologically
enlarged lymph nodes seen in the chest. Esophagus and thyroid are
unremarkable.

Lungs/Pleura: Central airways are patent. Centrilobular emphysema.
Linear opacities of the lower lungs, likely due to scarring or
atelectasis. Solid nodule of the right middle lobe measuring 6 mm on
series 5, image 93, unchanged in size when compared with prior exam.

Upper Abdomen: See separately dictated same day CT of the abdomen
for discussion of findings below the diaphragm.

Musculoskeletal: No chest wall mass or suspicious bone lesions
identified.
IMPRESSION: 1. Stable solid nodule of the right middle lobe. Consider additional
follow-up CT in 6-12 months to establish greater than 1 year
stability. Alternatively, given findings of emphysema, patient may
qualify for annual lung cancer lung cancer screening CT.
2. Coronary artery calcifications of the circumflex and LAD.
3. Aortic Atherosclerosis (11IC7-WBP.P) and Emphysema (11IC7-R69.E).
4. See separately dictated same day CT of the abdomen for discussion
of findings below the diaphragm.

## 2023-11-04 IMAGING — CT CT ABDOMEN W/ CM
3 of 5 series · 15 of 46 positions shown, 17 images · IV contrast (APPLIED)
Comparison: None.

CLINICAL DATA: Left upper quadrant abdominal pain

EXAM:
CT ABDOMEN WITH CONTRAST
TECHNIQUE: Multidetector CT imaging of the abdomen was performed using the
standard protocol following bolus administration of intravenous
contrast.

[Series 2: axial st · axial · 0.81mm/px · z∈[-501,-311]mm · 10 of 48 slices shown, 12 images]
[im 5/48  soft-tissue]
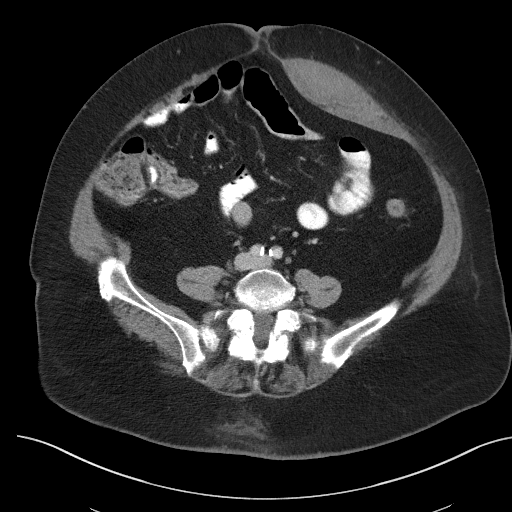
[im 5/48  bone]
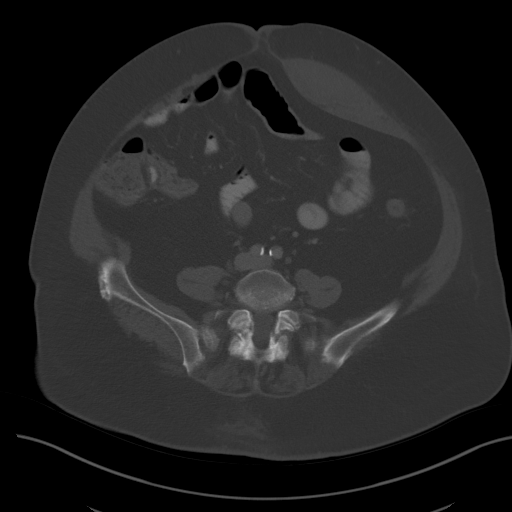
[im 9/48  soft-tissue]
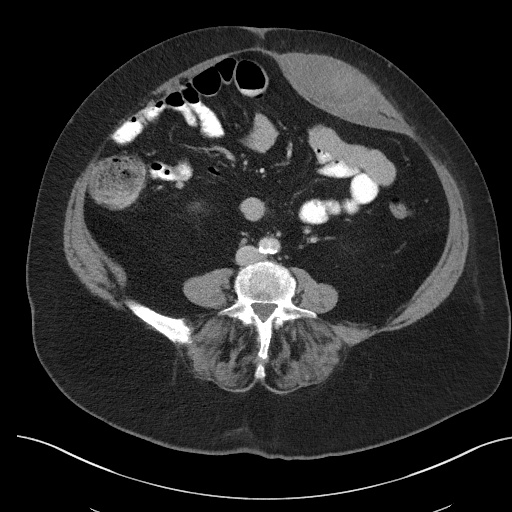
[im 13/48  soft-tissue]
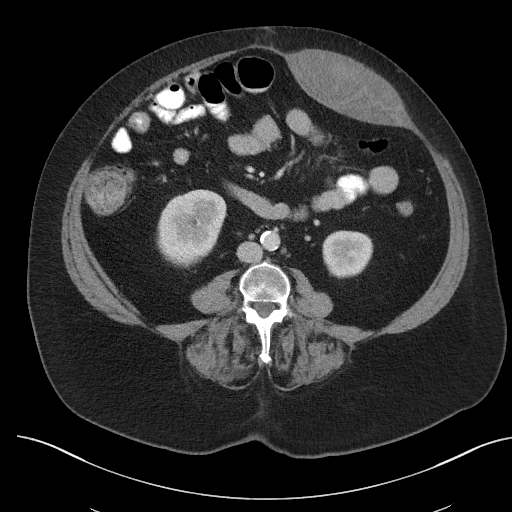
[im 18/48  soft-tissue]
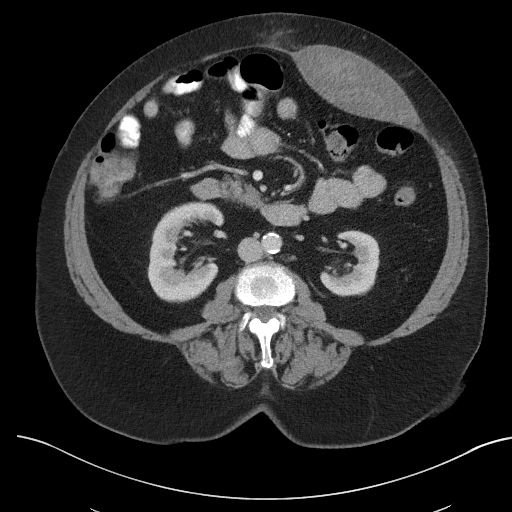
[im 22/48  soft-tissue]
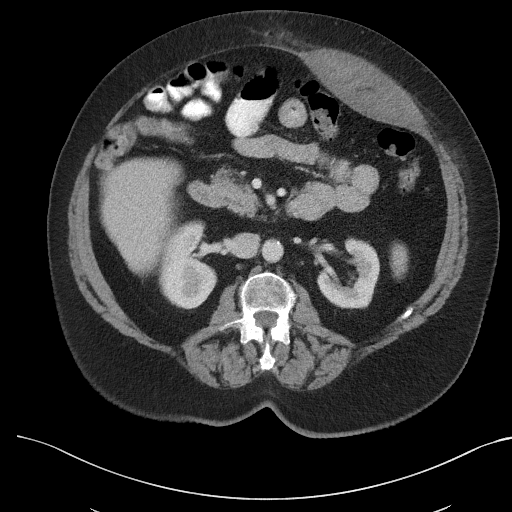
[im 26/48  soft-tissue]
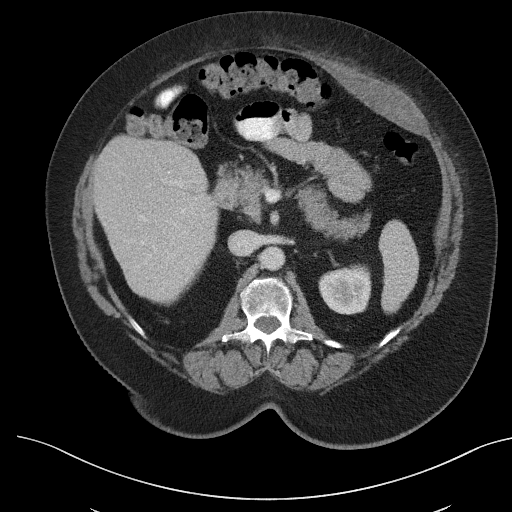
[im 30/48  soft-tissue]
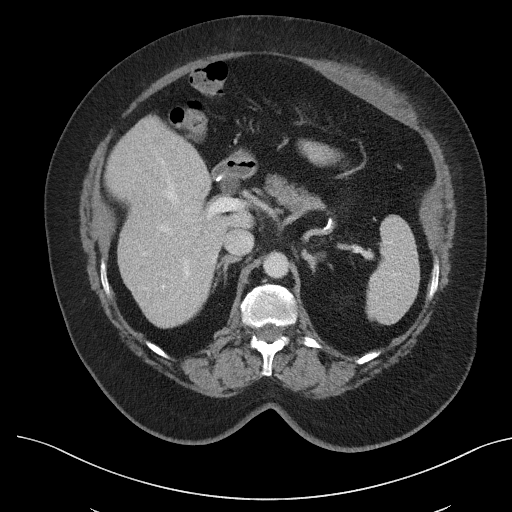
[im 35/48  soft-tissue]
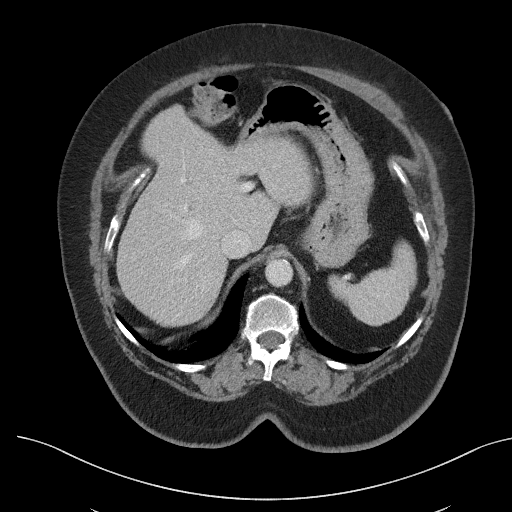
[im 39/48  soft-tissue]
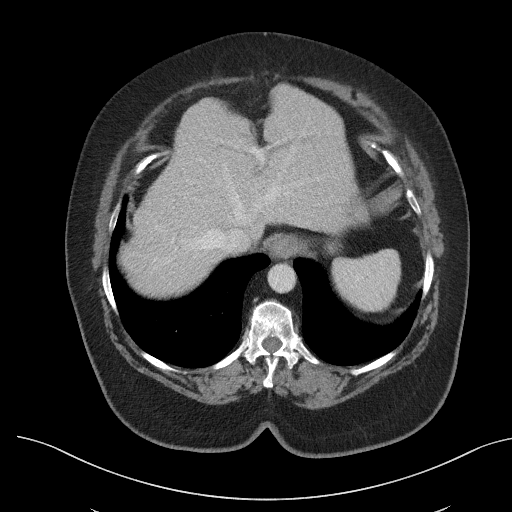
[im 39/48  bone]
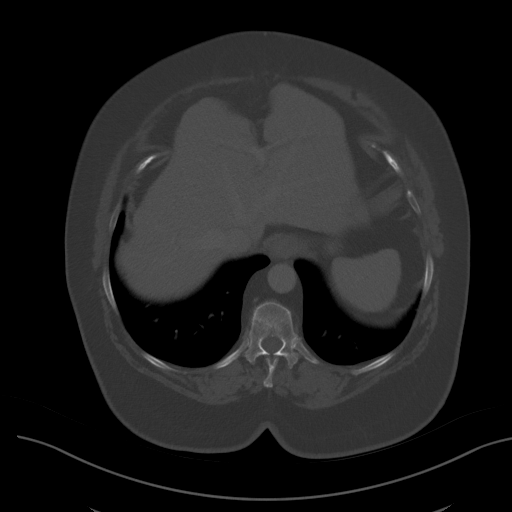
[im 43/48  soft-tissue]
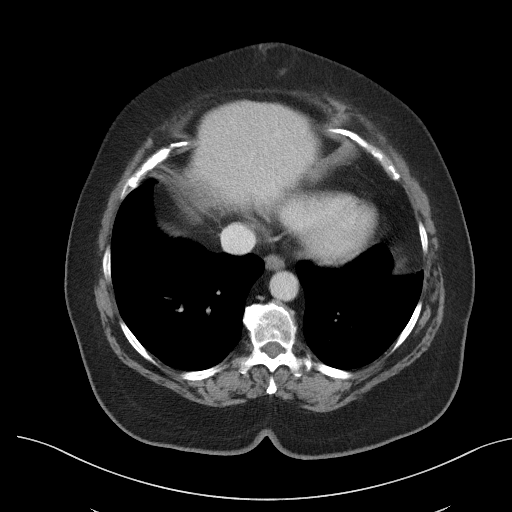

[Series 4: lung bases · axial · 0.66mm/px · z∈[-378,-362]mm · 2 of 51 slices shown]
[im 5/51  bone]
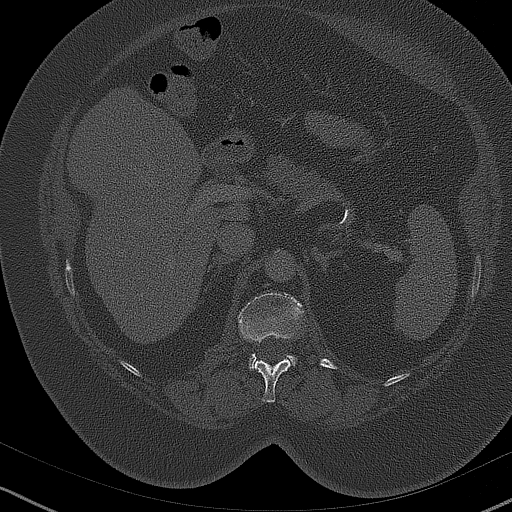
[im 13/51  bone]
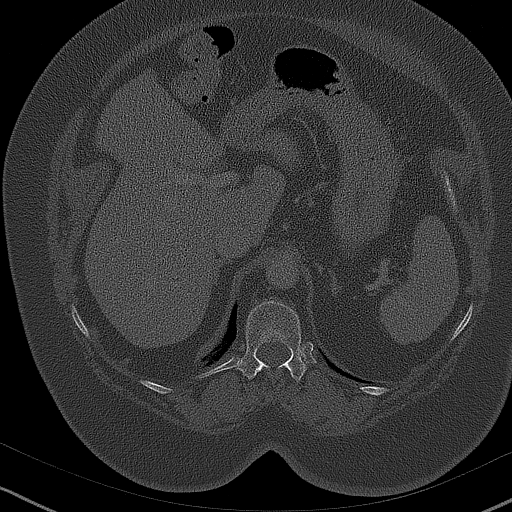

[Series 5: coronal st · coronal · 0.48mm/px · 3 of 117 slices shown]
[im 39/117  soft-tissue]
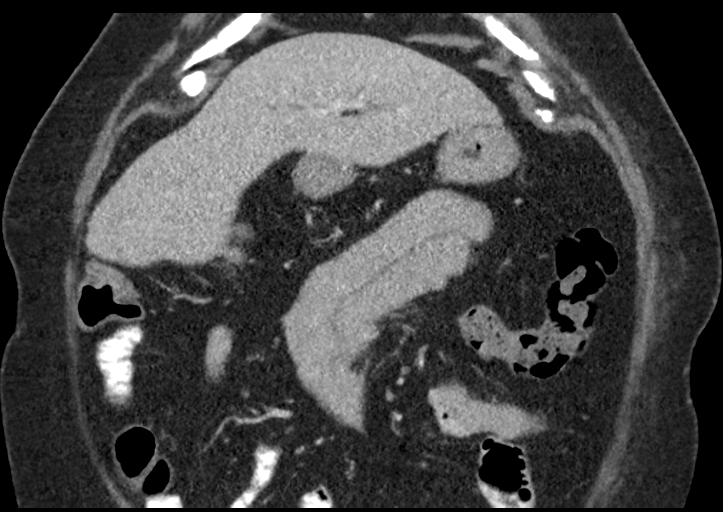
[im 52/117  soft-tissue]
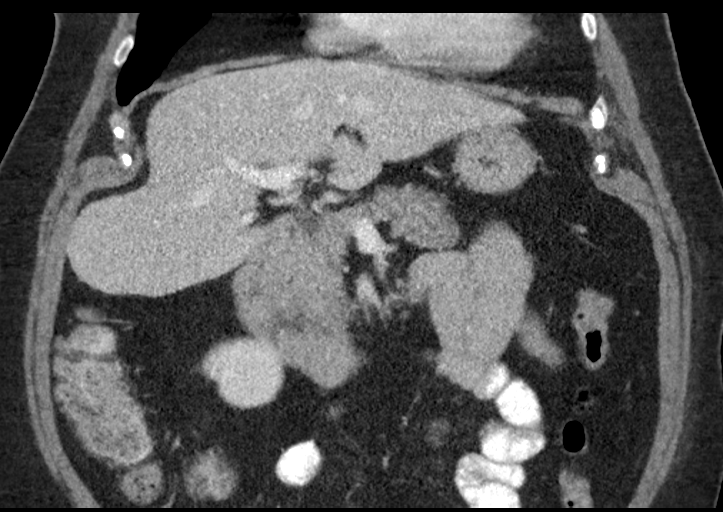
[im 65/117  soft-tissue]
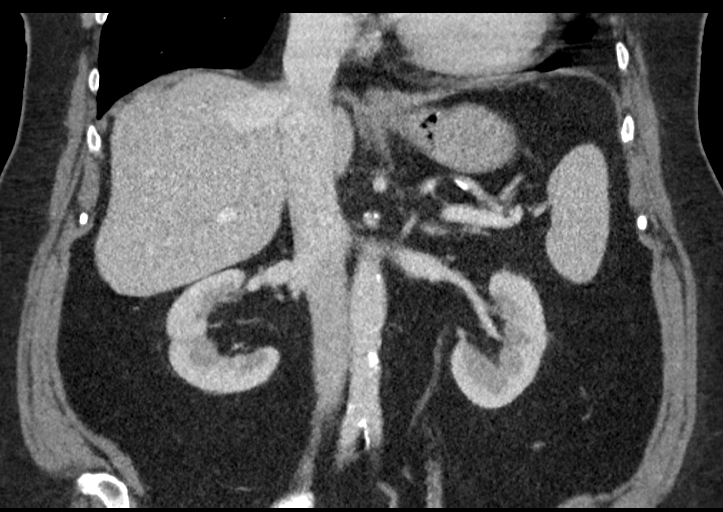

[15 of 46 positions shown; findings below may reference images not displayed]

RADIATION DOSE REDUCTION: This exam was performed according to the
departmental dose-optimization program which includes automated
exposure control, adjustment of the mA and/or kV according to
patient size and/or use of iterative reconstruction technique.

CONTRAST:  100mL OMNIPAQUE IOHEXOL 300 MG/ML  SOLN
FINDINGS: Lower chest: See same day separately dictated CT of the chest for
discussion of findings above the diaphragm.

Hepatobiliary: No focal liver abnormality is seen. Status post
cholecystectomy. No biliary dilatation.

Pancreas: Unremarkable. No pancreatic ductal dilatation or
surrounding inflammatory changes.

Spleen: Normal in size without focal abnormality.

Adrenals/Urinary Tract: Bilateral adrenal glands are unremarkable.
Kidneys enhance symmetrically with no evidence of hydronephrosis or
nephrolithiasis.

Stomach/Bowel: Normal appearance of the stomach. Visualized small
and large bowel demonstrate no evidence wall thickening, obstruction
or inflammatory change. Visualized appendix is normal.

Vascular/Lymphatic: Atherosclerotic disease of the abdominal aorta.
No pathologically enlarged lymph nodes seen in the abdomen.

Other: Heterogeneous collection of the left rectus abdominus
measuring 11.4 x 4.3 cm. No evidence of active extravasation.

Musculoskeletal: Partially visualized grade 1 anterolisthesis of L5
on S1 with chronic pars defects. No aggressive appearing osseous
lesions.
IMPRESSION: 1. Heterogeneous collection of the left rectus abdominus, likely a
hematoma. No evidence of active extravasation.
2. See same day separately dictated CT of the chest for discussion
of findings above the diaphragm.

## 2023-11-19 ENCOUNTER — Other Ambulatory Visit: Payer: Self-pay | Admitting: Internal Medicine

## 2023-11-28 ENCOUNTER — Ambulatory Visit: Payer: Medicare Other | Attending: Internal Medicine | Admitting: Internal Medicine

## 2023-11-28 ENCOUNTER — Telehealth: Payer: Self-pay | Admitting: Internal Medicine

## 2023-11-28 ENCOUNTER — Encounter: Payer: Self-pay | Admitting: Internal Medicine

## 2023-11-28 VITALS — BP 112/62 | HR 68 | Ht 61.0 in | Wt 188.6 lb

## 2023-11-28 DIAGNOSIS — E785 Hyperlipidemia, unspecified: Secondary | ICD-10-CM

## 2023-11-28 DIAGNOSIS — I10 Essential (primary) hypertension: Secondary | ICD-10-CM

## 2023-11-28 DIAGNOSIS — I2510 Atherosclerotic heart disease of native coronary artery without angina pectoris: Secondary | ICD-10-CM | POA: Diagnosis not present

## 2023-11-28 DIAGNOSIS — R002 Palpitations: Secondary | ICD-10-CM

## 2023-11-28 NOTE — Telephone Encounter (Signed)
 Rec'd CMN for Trilogy ventilator. Will send to Dr. Dione Franks for Signature.

## 2023-11-28 NOTE — Patient Instructions (Signed)
 Medication Instructions:  Continue  current medications *If you need a refill on your cardiac medications before your next appointment, please call your pharmacy*  Lab Work: none If you have labs (blood work) drawn today and your tests are completely normal, you will receive your results only by: MyChart Message (if you have MyChart) OR A paper copy in the mail If you have any lab test that is abnormal or we need to change your treatment, we will call you to review the results.  Testing/Procedures: none  Follow-Up: At [facility], you and your health needs are our priority.  As part of our continuing mission to provide you with exceptional heart care, our providers are all part of one team.  This team includes your primary Cardiologist (physician) and Advanced Practice Providers or APPs (Physician Assistants and Nurse Practitioners) who all work together to provide you with the care you need, when you need it.  Your next appointment:   6 month(s)  Provider:   Callie Goodrich  We recommend signing up for the patient portal called MyChart.  Sign up information is provided on this After Visit Summary.  MyChart is used to connect with patients for Virtual Visits (Telemedicine).  Patients are able to view lab/test results, encounter notes, upcoming appointments, etc.  Non-urgent messages can be sent to your provider as well.   To learn more about what you can do with MyChart, go to [email or web address].   Other Instructions Please check blood daily and keep log of those readings and send via [email or web address]

## 2023-11-28 NOTE — Progress Notes (Signed)
 Cardiology Office Note:  .   Date:  11/28/2023  ID:  Amanda Garrett, DOB 18-Nov-1955, MRN [id number] PCP: Loring Tanda Mae, MD  [facility] Providers Cardiologist:  Soyla DELENA Merck, MD    History of Present Illness: .   Amanda Garrett is a 68 y.o. female.  Discussed the use of AI scribe software for clinical note transcription with the patient, who gave verbal consent to proceed.  History of Present Illness Amanda Garrett is a 68 year old female with hypertension and coronary artery disease who presents for a follow-up visit.  She has mild nonobstructive coronary artery disease identified on a coronary CT in November 2023. She experiences palpitations with short runs of non-sustained ventricular tachycardia, supraventricular tachycardia, and premature ventricular contractions as noted on a prior monitor. She takes aspirin  81 mg daily and rosuvastatin  20 mg daily, with her last LDL at 51 mg/dL.  Her hypertension is managed with amlodipine  5 mg daily, losartan  75 mg daily, and propranolol  10 mg twice daily. Her blood pressure at the last visit was 112/62 mmHg, and she feels good with her current regimen. She occasionally experiences lightheadedness and dizziness. She was initially on one tablet of losartan  and increased to one and a half tablets.  No significant worsening of symptoms in the last six months. No new or worsening palpitations.    ROS: negative except per HPI above.  Studies Reviewed: SABRA   EKG Interpretation Date/Time:  Tuesday November 28 2023 09:28:31 EDT Ventricular Rate:  65 PR Interval:  126 QRS Duration:  76 QT Interval:  408 QTC Calculation: 424 R Axis:   53  Text Interpretation: Normal sinus rhythm Low voltage QRS Nonspecific T wave abnormality When compared with ECG of 10-May-2023 15:19, No significant change was found Confirmed by Merck Soyla ([id number]) on 11/28/2023 9:40:02 AM    Results LABS LDL: 51 (2024)  RADIOLOGY Coronary CT: Mild  nonobstructive CAD (04/2022) Risk Assessment/Calculations:       Physical Exam:   VS:  BP 112/62   Pulse 68   Ht 5' 1 (1.549 m)   Wt 188 lb 9.6 oz (85.5 kg)   SpO2 93%   BMI 35.64 kg/m    Wt Readings from Last 3 Encounters:  11/28/23 188 lb 9.6 oz (85.5 kg)  06/05/23 186 lb (84.4 kg)  05/10/23 188 lb (85.3 kg)     Physical Exam VITALS: BP- 112/62 GENERAL: Alert, cooperative, well developed, no acute distress HEENT: Normocephalic, normal oropharynx, moist mucous membranes CHEST: Clear to auscultation bilaterally, no wheezes, rhonchi, or crackles CARDIOVASCULAR: Normal heart rate and rhythm, S1 and S2 normal without murmurs ABDOMEN: Soft, non-tender, non-distended, without organomegaly, normal bowel sounds EXTREMITIES: No cyanosis or edema NEUROLOGICAL: Cranial nerves grossly intact, moves all extremities without gross motor or sensory deficit   ASSESSMENT AND PLAN: .    Assessment and Plan Assessment & Plan Palpitations with NSVT, SVT, and PVCs Palpitations controlled with propranolol  10 mg twice daily. No increase in frequency or severity. - Continue propranolol  10 mg twice daily. - Advise to maintain hydration.  Nonobstructive Coronary Artery Disease (CAD) Condition stable on aspirin  81 mg daily. - Continue aspirin  81 mg daily.  Hypertension Blood pressure controlled with amlodipine , losartan , and propranolol . Occasional lightheadedness noted. Current BP 112/62. Monitor to prevent hypotension. - Continue amlodipine  5 mg daily. - Continue losartan  75 mg daily. - Continue propranolol  10 mg twice daily. - Instruct to keep a daily blood pressure log and bring it to the  next appointment. - Advise to monitor blood pressure daily, preferably in the morning. - If blood pressure falls below 110/60, reduce losartan  to 50 mg daily and contact the office.  Hyperlipidemia Condition stable on rosuvastatin  20 mg daily. Last LDL 51. - Continue rosuvastatin  20 mg  daily.      Soyla Merck, MD, FACC

## 2023-12-04 ENCOUNTER — Other Ambulatory Visit: Payer: Self-pay | Admitting: Family Medicine

## 2023-12-04 DIAGNOSIS — Z1231 Encounter for screening mammogram for malignant neoplasm of breast: Secondary | ICD-10-CM

## 2023-12-11 NOTE — Telephone Encounter (Signed)
 CMN signed by Dr Meade, faxed to [phone].Awaiting fax confirmation

## 2023-12-12 ENCOUNTER — Ambulatory Visit
Admission: RE | Admit: 2023-12-12 | Discharge: 2023-12-12 | Disposition: A | Discharge status: Home / Self Care | Source: Ambulatory Visit | Attending: Family Medicine | Admitting: Family Medicine

## 2023-12-12 DIAGNOSIS — Z1231 Encounter for screening mammogram for malignant neoplasm of breast: Secondary | ICD-10-CM

## 2023-12-18 ENCOUNTER — Other Ambulatory Visit: Payer: Self-pay | Admitting: Internal Medicine

## 2023-12-29 ENCOUNTER — Other Ambulatory Visit: Payer: Self-pay | Admitting: Internal Medicine

## 2023-12-29 DIAGNOSIS — J302 Other seasonal allergic rhinitis: Secondary | ICD-10-CM

## 2024-01-15 ENCOUNTER — Other Ambulatory Visit: Payer: Self-pay | Admitting: Physician Assistant

## 2024-01-19 ENCOUNTER — Other Ambulatory Visit: Payer: Self-pay

## 2024-01-19 ENCOUNTER — Telehealth: Payer: Self-pay | Admitting: Internal Medicine

## 2024-01-19 MED ORDER — LOSARTAN POTASSIUM 50 MG PO TABS: 75.0000 mg | ORAL_TABLET | Freq: Every day | ORAL | 3 refills | Status: DC

## 2024-01-19 MED ORDER — LOSARTAN POTASSIUM 50 MG PO TABS: 75.0000 mg | ORAL_TABLET | Freq: Every day | ORAL | 3 refills | Status: AC

## 2024-01-19 NOTE — Telephone Encounter (Signed)
 Refill send of Losartan  to [facility].

## 2024-01-19 NOTE — Telephone Encounter (Signed)
*  STAT* If patient is at the pharmacy, call can be transferred to refill team.   1. Which medications need to be refilled? (please list name of each medication and dose if known) losartan  (COZAAR ) 50 MG tablet    2. Would you like to learn more about the convenience, safety, & potential cost savings by using the [facility]? NO    3. Are you open to using the [facility] NO   4. Which pharmacy/location (including street and city if local pharmacy) is medication to be sent to? [id number] - [place] ([place]), [place] - [address]    5. Do they need a 30 day or 90 day supply? 90  Patient needs medications, it was supposed to be sent in earlier this week

## 2024-01-22 ENCOUNTER — Other Ambulatory Visit: Payer: Self-pay | Admitting: Internal Medicine

## 2024-01-22 DIAGNOSIS — J302 Other seasonal allergic rhinitis: Secondary | ICD-10-CM

## 2024-01-26 ENCOUNTER — Other Ambulatory Visit: Payer: Self-pay | Admitting: Internal Medicine

## 2024-02-26 ENCOUNTER — Other Ambulatory Visit: Payer: Self-pay | Admitting: Internal Medicine

## 2024-02-29 ENCOUNTER — Other Ambulatory Visit: Payer: Self-pay | Admitting: Internal Medicine

## 2024-03-04 ENCOUNTER — Other Ambulatory Visit: Payer: Self-pay | Admitting: Internal Medicine

## 2024-03-07 ENCOUNTER — Other Ambulatory Visit: Payer: Self-pay | Admitting: Internal Medicine

## 2024-03-12 ENCOUNTER — Other Ambulatory Visit: Payer: Self-pay | Admitting: Internal Medicine

## 2024-03-13 ENCOUNTER — Other Ambulatory Visit: Payer: Self-pay | Admitting: Internal Medicine

## 2024-03-13 NOTE — Telephone Encounter (Signed)
 Pt must keep appt with Izetta Rouleau on 04/24/24 for more refills.

## 2024-04-12 LAB — LAB REPORT - SCANNED
A1c: 5.7
Albumin, Urine POC: 31
Albumin/Creatinine Ratio, Urine, POC: 38
Creatinine, POC: 80.7 mg/dL
EGFR: 68

## 2024-04-15 ENCOUNTER — Other Ambulatory Visit: Payer: Self-pay | Admitting: Physician Assistant

## 2024-04-15 DIAGNOSIS — I2510 Atherosclerotic heart disease of native coronary artery without angina pectoris: Secondary | ICD-10-CM

## 2024-04-15 DIAGNOSIS — E782 Mixed hyperlipidemia: Secondary | ICD-10-CM

## 2024-04-15 DIAGNOSIS — I700 Atherosclerosis of aorta: Secondary | ICD-10-CM

## 2024-04-15 DIAGNOSIS — E7800 Pure hypercholesterolemia, unspecified: Secondary | ICD-10-CM

## 2024-04-15 DIAGNOSIS — Z79899 Other long term (current) drug therapy: Secondary | ICD-10-CM

## 2024-04-24 ENCOUNTER — Ambulatory Visit: Admitting: Nurse Practitioner

## 2024-04-24 ENCOUNTER — Encounter: Payer: Self-pay | Admitting: Nurse Practitioner

## 2024-04-24 VITALS — BP 122/72 | HR 63 | Temp 98.2°F | Ht 63.0 in | Wt 186.2 lb

## 2024-04-24 DIAGNOSIS — J449 Chronic obstructive pulmonary disease, unspecified: Secondary | ICD-10-CM | POA: Diagnosis not present

## 2024-04-24 DIAGNOSIS — J9611 Chronic respiratory failure with hypoxia: Secondary | ICD-10-CM | POA: Diagnosis not present

## 2024-04-24 MED ORDER — TRELEGY ELLIPTA 200-62.5-25 MCG/ACT IN AEPB: 1.0000 | INHALATION_SPRAY | Freq: Every day | RESPIRATORY_TRACT | 11 refills | Status: AC

## 2024-04-24 MED ORDER — VENTOLIN HFA 108 (90 BASE) MCG/ACT IN AERS: 2.0000 | INHALATION_SPRAY | Freq: Four times a day (QID) | RESPIRATORY_TRACT | 2 refills | Status: AC | PRN

## 2024-04-24 NOTE — Assessment & Plan Note (Signed)
 No increasing O2 demand. Continue supplemental oxygen as needed with activity and at night with NIV. Goal SpO2 >88-90%

## 2024-04-24 NOTE — Assessment & Plan Note (Signed)
 Stable COPD without recent exacerbation or hospitalizations. Breathing stable. No increasing O2 demand. Continue on triple therapy. Compliant with Trilogy vent at night - continues to receive good benefit. Trelegy and albuterol  refilled. Action plan in place. Trigger prevention reviewed.   Patient Instructions  -Continue Albuterol  inhaler 2 puffs or 3 mL neb every 6 hours as needed for shortness of breath or wheezing. Notify if symptoms persist despite rescue inhaler/neb use. -Continue Trelegy 1 puff daily. Brush tongue and rinse mouth well afterwards.  -Continue supplemental oxygen 2 lpm as needed with activity for goal oxygen level >88-90%. Continue non-invasive ventilation at night. -Continue Singulair  10 mg At bedtime   Recommend getting your RSV vaccine  Follow up in one year with any new MD to establish care. If symptoms do not improve or worsen, please contact office for sooner follow up or seek emergency care.

## 2024-04-24 NOTE — Progress Notes (Signed)
 @Patient  ID: Amanda Garrett, female    DOB: 11-14-1955, 68 y.o.   MRN: [id number]  Chief Complaint  Patient presents with   COPD   Obstructive Sleep Apnea    Follow up med refill    Referring provider: Loring Tanda Mae, *  HPI: 68 year old female, former smoker (68 pack years) followed for COPD and chronic respiratory failure on supplemental O2.  She is a patient of Dr. Correne and last seen 02/16/2023.  She was recently admitted for AECOPD and discharged on 08/08/2021. Past medical history significant for GERD, history of asthma, DM 2, hypertension, HLD.    TEST/EVENTS:  12/02/2020 PFTs: FVC 1.29 (47), FEV1 0.75 (35), ratio 58, TLC 90%, DLCO corrected 76%.  Severe obstructive airway disease, moderate to severe restrictive airway disease with normal lung volumes and mild diffusion defect. 08/23/2021 CT chest wo con: Atherosclerosis.  Prominent prevascular lymph node, unchanged compared to prior and likely reactive.  Central airways are patent.  Centrilobular emphysema present.  Linear opacities of the lower lungs, likely due to scarring or atelectasis.  Solid nodule of the right middle lobe measuring 6 mm, unchanged when compared to previous.  Recommended repeat follow-up CT in 6 to 12 months. 08/23/2021 CT abdomen with contrast: 11.4 x 4.3 cm heterogeneous collection in left rectus abdominus, concerning for likely hematoma.  No evidence of extravasation. 08/15/2023 CT chest: atherosclerosis. Mild emphysema. Mild mosaic attenuation. Mild linear opacities in LLL and RML, scarring. RUL clustered nodular opacities no longer present. Stable nodules dating back to 2022. New small solid nodule LUL 3 mm.   02/16/2023: OV with Dr. Meade. No interval hospitalizations or ED visits. Some days doesn't use Trelegy and doesn't seem to make a difference. Using albuterol  less than 1-2 times/day. Trying to stay active. Prescribed POC machine. Out of the window for lung cancer screening.   04/24/2024: Today -  follow up Discussed the use of AI scribe software for clinical note transcription with the patient, who gave verbal consent to proceed.  History of Present Illness Amanda Garrett is a 68 year old female with chronic respiratory failure and COPD who presents for a pulmonary follow-up.  Her breathing has been stable, and she uses supplemental oxygen/NIV at night and occasionally O2 during the day for activities such as long walks or attending her grandkids' football games, especially when climbing bleachers. She can walk on level ground without issues but experiences shortness of breath with elevation or prolonged exertion.  She has not needed to use her albuterol  inhaler recently, and her current prescription is expiring. She uses Trelegy and needs a refill. Her average oxygen saturation is 95% off oxygen. She experiences a rare cough, mostly in the morning, which clears up after expelling mucus. No significant change in sputum color. No fevers, chills, hemoptysis, anorexia, unexplained weight loss.   She has received her flu shot at [place].    Allergies  Allergen Reactions   Spiriva Respimat [Tiotropium Bromide] Anaphylaxis   Latex Rash and Other (See Comments)    NO powdered gloves!!   Nickel Rash    Immunization History  Administered Date(s) Administered   Fluad Trivalent(High Dose 65+) 03/27/2024   Influenza-Unspecified 04/06/2022   PFIZER(Purple Top)SARS-COV-2 Vaccination 08/15/2019, 09/11/2019, 03/02/2020    Past Medical History:  Diagnosis Date   Asthma    COPD (chronic obstructive pulmonary disease) (HCC)    Diabetes mellitus without complication (HCC)    Emphysema of lung (HCC)    GERD (gastroesophageal reflux disease)  Hypercholesteremia 2011   Hypertension    Lichen sclerosus et atrophicus    vulva   Oxygen deficiency    Palpitation    on Inderal     Tobacco History: Social History   Tobacco Use  Smoking Status Former   Current packs/day: 0.00   Average  packs/day: 2.0 packs/day for 34.0 years (68.0 ttl pk-yrs)   Types: Cigarettes   Start date: 13   Quit date: 06/1996   Years since quitting: 27.8  Smokeless Tobacco Never   Counseling given: Not Answered   Outpatient Medications Prior to Visit  Medication Sig Dispense Refill   albuterol  (PROVENTIL ) (2.5 MG/3ML) 0.083% nebulizer solution Take 2.5 mg by nebulization every 6 (six) hours as needed for wheezing or shortness of breath.     amLODipine  (NORVASC ) 5 MG tablet Take 1 tablet (5 mg total) by mouth daily. 90 tablet 3   BAYER LOW DOSE 81 MG EC tablet Take 81 mg by mouth daily. Swallow whole.     Biotin 1 MG CAPS Take by mouth.     Calcium -Magnesium -Zinc (CAL-MAG-ZINC PO) Take 1 tablet by mouth daily.     clobetasol ointment (TEMOVATE) 0.05 % Apply 1 application topically daily as needed (in the perineal area).     Cyanocobalamin  (B-12) 2500 MCG TABS Take 2,500 mcg by mouth daily.     fluticasone  (FLONASE ) 50 MCG/ACT nasal spray Place 2 sprays into both nostrils daily. 18.2 mL 2   levocetirizine (XYZAL ) 5 MG tablet TAKE 1 TABLET BY MOUTH ONCE DAILY IN THE EVENING 30 tablet 0   loratadine  (CLARITIN ) 10 MG tablet Take 10 mg by mouth daily.     losartan  (COZAAR ) 50 MG tablet Take 1.5 tablets (75 mg total) by mouth daily. 135 tablet 3   metFORMIN (GLUCOPHAGE) 850 MG tablet Take 850 mg by mouth daily with breakfast.     Misc Natural Products (SAMBUCUS ELDERBERRY IMMUNE) SYRP Take 5 mLs by mouth daily.     montelukast  (SINGULAIR ) 10 MG tablet Take 10 mg by mouth at bedtime.     Multiple Vitamins-Minerals (AIRBORNE PO) Take 1 tablet by mouth daily.     Multiple Vitamins-Minerals (CENTRUM SILVER ULTRA WOMENS) TABS Take 1 tablet by mouth daily.     NON FORMULARY Turmeric, quercetin, bromelain     OXYGEN Inhale 1 L into the lungs continuous.     Potassium 99 MG TABS Take 99 mg by mouth daily.     propranolol  (INDERAL ) 10 MG tablet Take 10 mg by mouth 2 (two) times daily.     rosuvastatin   (CRESTOR ) 20 MG tablet Take 1 tablet (20 mg total) by mouth daily. 90 tablet 3   triamcinolone (KENALOG) 0.025 % cream Apply 1 application topically 2 (two) times daily as needed (rash).     vitamin C (ASCORBIC ACID ) 500 MG tablet Take 500 mg by mouth daily.     zinc gluconate 50 MG tablet Take 50 mg by mouth daily.     TRELEGY ELLIPTA  200-62.5-25 MCG/ACT AEPB INHALE 1 PUFF ONCE DAILY 60 each 0   VENTOLIN  HFA 108 (90 Base) MCG/ACT inhaler Inhale 2 puffs into the lungs every 6 (six) hours as needed for wheezing or shortness of breath.     Cholecalciferol  (VITAMIN D -3) 25 MCG (1000 UT) CAPS Take 1,000 Units by mouth daily.     No facility-administered medications prior to visit.     Review of Systems: as above     Physical Exam:  BP 122/72   Pulse 63  Temp 98.2 F (36.8 C) (Oral)   Ht 5' 3 (1.6 m)   Wt 186 lb 3.2 oz (84.5 kg)   SpO2 95%   BMI 32.98 kg/m   GEN: Pleasant, interactive,chronically-ill appearing; obese; in no acute distress. HEENT:  Normocephalic and atraumatic. EACs patent bilaterally. TM pearly gray with present light reflex bilaterally. PERRLA. Sclera white. Nasal turbinates pink, moist and patent bilaterally. No rhinorrhea present. Oropharynx pink and moist, without exudate or edema. No lesions, ulcerations NECK:  Supple w/ fair ROM.  CV: RRR, no m/r/g, no peripheral edema.  PULMONARY:  Unlabored, regular breathing. Clear bilaterally A&P w/o wheezes/rales/rhonchi. No accessory muscle use. No dullness to percussion. GI: BS present and normoactive. Soft, non-tender to palpation.  MSK: No erythema, warmth or tenderness.  Neuro: A/Ox3. No focal deficits noted.   Skin: Warm, no lesions or rashes.  Psych: Normal affect and behavior. Judgement and thought content appropriate.     Lab Results:  CBC    Component Value Date/Time   WBC 9.3 08/23/2021 1640   RBC 3.47 (L) 08/23/2021 1640   HGB 10.5 (L) 08/23/2021 1640   HCT 31.6 (L) 08/23/2021 1640   PLT 314.0  08/23/2021 1640   MCV 90.9 08/23/2021 1640   MCH 29.6 08/08/2021 0619   MCHC 33.4 08/23/2021 1640   RDW 15.2 08/23/2021 1640   LYMPHSABS 1.7 08/23/2021 1640   MONOABS 0.5 08/23/2021 1640   EOSABS 0.1 08/23/2021 1640   BASOSABS 0.0 08/23/2021 1640    BMET    Component Value Date/Time   NA 142 11/29/2022 1412   K 4.7 11/29/2022 1412   CL 101 11/29/2022 1412   CO2 23 11/29/2022 1412   GLUCOSE 93 11/29/2022 1412   GLUCOSE 99 08/08/2021 0619   BUN 12 11/29/2022 1412   CREATININE 0.76 11/29/2022 1412   CALCIUM  10.1 11/29/2022 1412   GFRNONAA >60 08/08/2021 0619   GFRAA  05/24/2010 1203    >60        The eGFR has been calculated using the MDRD equation. This calculation has not been validated in all clinical situations. eGFR's persistently <60 mL/min signify possible Chronic Kidney Disease.    BNP    Component Value Date/Time   BNP 96.9 08/05/2021 1818     Imaging:  No results found.  Administration History     None          Latest Ref Rng & Units 12/02/2020    3:50 PM  PFT Results  FVC-Pre L 1.29   FVC-Predicted Pre % 47   FVC-Post L 1.28   FVC-Predicted Post % 46   Pre FEV1/FVC % % 55   Post FEV1/FCV % % 58   FEV1-Pre L 0.70   FEV1-Predicted Pre % 33   FEV1-Post L 0.75   DLCO uncorrected ml/min/mmHg 13.56   DLCO UNC% % 76   DLCO corrected ml/min/mmHg 13.65   DLCO COR %Predicted % 76   DLVA Predicted % 113   TLC L 4.19   TLC % Predicted % 90   RV % Predicted % 139     No results found for: NITRICOXIDE      Assessment & Plan:   COPD, severe (HCC) Stable COPD without recent exacerbation or hospitalizations. Breathing stable. No increasing O2 demand. Continue on triple therapy. Compliant with Trilogy vent at night - continues to receive good benefit. Trelegy and albuterol  refilled. Action plan in place. Trigger prevention reviewed.   Patient Instructions  -Continue Albuterol  inhaler 2 puffs or 3 mL  neb every 6 hours as needed for  shortness of breath or wheezing. Notify if symptoms persist despite rescue inhaler/neb use. -Continue Trelegy 1 puff daily. Brush tongue and rinse mouth well afterwards.  -Continue supplemental oxygen 2 lpm as needed with activity for goal oxygen level >88-90%. Continue non-invasive ventilation at night. -Continue Singulair  10 mg At bedtime   Recommend getting your RSV vaccine  Follow up in one year with any new MD to establish care. If symptoms do not improve or worsen, please contact office for sooner follow up or seek emergency care.   Chronic respiratory failure with hypoxia (HCC) No increasing O2 demand. Continue supplemental oxygen as needed with activity and at night with NIV. Goal SpO2 >88-90%    I spent 25 minutes of dedicated to the care of this patient on the date of this encounter to include pre-visit review of records, face-to-face time with the patient discussing conditions above, post visit ordering of testing, clinical documentation with the electronic health record, making appropriate referrals as documented, and communicating necessary findings to members of the patients care team.  Comer LULLA Rouleau, NP 04/24/2024  Pt aware and understands NP's role.

## 2024-04-24 NOTE — Patient Instructions (Addendum)
-  Continue Albuterol  inhaler 2 puffs or 3 mL neb every 6 hours as needed for shortness of breath or wheezing. Notify if symptoms persist despite rescue inhaler/neb use. -Continue Trelegy 1 puff daily. Brush tongue and rinse mouth well afterwards.  -Continue supplemental oxygen 2 lpm as needed with activity for goal oxygen level >88-90%. Continue non-invasive ventilation at night. -Continue Singulair  10 mg At bedtime   Recommend getting your RSV vaccine  Follow up in one year with any new MD to establish care. If symptoms do not improve or worsen, please contact office for sooner follow up or seek emergency care.

## 2024-05-06 ENCOUNTER — Other Ambulatory Visit: Payer: Self-pay | Admitting: Internal Medicine

## 2024-05-06 ENCOUNTER — Ambulatory Visit: Attending: Internal Medicine | Admitting: Internal Medicine

## 2024-05-06 VITALS — BP 136/64 | HR 58 | Ht 61.75 in | Wt 186.2 lb

## 2024-05-06 DIAGNOSIS — E782 Mixed hyperlipidemia: Secondary | ICD-10-CM | POA: Diagnosis present

## 2024-05-06 DIAGNOSIS — I10 Essential (primary) hypertension: Secondary | ICD-10-CM | POA: Insufficient documentation

## 2024-05-06 DIAGNOSIS — E7800 Pure hypercholesterolemia, unspecified: Secondary | ICD-10-CM

## 2024-05-06 DIAGNOSIS — J449 Chronic obstructive pulmonary disease, unspecified: Secondary | ICD-10-CM | POA: Diagnosis present

## 2024-05-06 DIAGNOSIS — E118 Type 2 diabetes mellitus with unspecified complications: Secondary | ICD-10-CM | POA: Diagnosis present

## 2024-05-06 DIAGNOSIS — I700 Atherosclerosis of aorta: Secondary | ICD-10-CM | POA: Insufficient documentation

## 2024-05-06 DIAGNOSIS — Z79899 Other long term (current) drug therapy: Secondary | ICD-10-CM | POA: Diagnosis present

## 2024-05-06 DIAGNOSIS — E785 Hyperlipidemia, unspecified: Secondary | ICD-10-CM | POA: Diagnosis present

## 2024-05-06 DIAGNOSIS — R002 Palpitations: Secondary | ICD-10-CM | POA: Diagnosis present

## 2024-05-06 DIAGNOSIS — I2510 Atherosclerotic heart disease of native coronary artery without angina pectoris: Secondary | ICD-10-CM | POA: Insufficient documentation

## 2024-05-06 MED ORDER — ROSUVASTATIN CALCIUM 20 MG PO TABS: 20.0000 mg | ORAL_TABLET | Freq: Every day | ORAL | 3 refills | Status: AC

## 2024-05-06 NOTE — Progress Notes (Signed)
 Cardiology Office Note:  .   Date:  05/06/2024  ID:  Amanda Garrett, DOB 28-Aug-1955, MRN [id number] PCP: Loring Tanda Mae, MD  [facility] Providers Cardiologist:  Soyla DELENA Merck, MD    History of Present Illness: .   Amanda Garrett is a 68 y.o. female.  Discussed the use of AI scribe software for clinical note transcription with the patient, who gave verbal consent to proceed.  History of Present Illness Amanda Garrett is a 68 year old female with hypertension and coronary artery disease who presents for follow-up.  She has mild nonobstructive coronary artery disease on coronary CT from November 2023 and prior palpitations with short runs of non-sustained ventricular tachycardia, supraventricular tachycardia, and PVCs on monitoring. She takes aspirin  81 mg and rosuvastatin  20 mg daily. Her most recent LDL last year was 51. She has had no new cardiac symptoms since her last visit.  Her hypertension is managed with amlodipine  5 mg daily, losartan  75 mg daily, and propranolol  10 mg twice daily. She brings a blood pressure log for review. She notes variable readings that sometimes rise with salty foods. Her only new issue since June is right back discomfort suggestive of a muscle strain with certain positions.  She has COPD and chronic respiratory failure and uses 2 liters of supplemental oxygen continuously. Pulmonology follows her and she has no new respiratory symptoms.  She has diabetes managed with metformin 850 mg daily after a recent dose reduction for improved labs. Her last A1c was 5.8. She has reduced appetite for sweets and no symptoms of hypoglycemia.    ROS: negative except per HPI above.  Studies Reviewed: .        Results LABS LDL: 51 (2024) A1c: 5.8  RADIOLOGY Coronary CT: Mild nonobstructive coronary artery disease (04/2022)  DIAGNOSTIC Echocardiogram: Normal (2023) Risk Assessment/Calculations:       Physical Exam:   VS:  BP 136/64 (BP  Location: Left Arm, Patient Position: Sitting, Cuff Size: Large)   Pulse (!) 58   Ht 5' 1.75 (1.568 m)   Wt 186 lb 3.2 oz (84.5 kg)   SpO2 94%   BMI 34.33 kg/m    Wt Readings from Last 3 Encounters:  05/06/24 186 lb 3.2 oz (84.5 kg)  04/24/24 186 lb 3.2 oz (84.5 kg)  11/28/23 188 lb 9.6 oz (85.5 kg)     Physical Exam VITALS: BP- 136/64 GENERAL: Alert, cooperative, well developed, no acute distress HEENT: Normocephalic, normal oropharynx, moist mucous membranes CHEST: Clear to auscultation bilaterally, no wheezes, rhonchi, or crackles CARDIOVASCULAR: Normal heart rate and rhythm, S1 and S2 normal without murmurs ABDOMEN: Soft, non-tender, non-distended, without organomegaly, normal bowel sounds EXTREMITIES: No cyanosis or edema NEUROLOGICAL: Cranial nerves grossly intact, moves all extremities without gross motor or sensory deficit   ASSESSMENT AND PLAN: .    Assessment and Plan Assessment & Plan Hypertension Mildly elevated blood pressures with occasional readings in the 140s and 150s. Current medications include amlodipine , losartan , and propranolol . Blood pressure today is 136/64. No symptoms of hypotension reported. Discussed dietary influences, particularly salt intake, on blood pressure readings. Decided to monitor blood pressure and dietary habits for six months before considering medication adjustment. - Continue amlodipine  5 mg daily, losartan  75 mg daily, propranolol  10 mg twice daily. - Monitor blood pressure and dietary habits, particularly salt intake, for six months. - Consider increasing losartan  to two whole tabs (100 mg) if blood pressure remains elevated after six months.  Coronary artery disease,  nonobstructive Med mgmt Mild nonobstructive coronary artery disease identified on coronary CT in November 2023. Currently managed with aspirin  and rosuvastatin . No new symptoms reported. - Continue aspirin  81 mg daily. - Refilled rosuvastatin  20 mg  daily.  Palpitations with supraventricular and ventricular arrhythmias Palpitations with non-sustained ventricular tachycardia, supraventricular tachycardia, and PVCs noted on prior monitor. Managed with propranolol . - Continue propranolol  10 mg twice daily.  Type 2 diabetes mellitus, controlled Type 2 diabetes mellitus well-controlled with recent A1c of 5.8. Currently on metformin, reduced to one pill daily. No symptoms of hypoglycemia reported. - Continue metformin as prescribed. - Obtain recent lab results from primary care provider for review.       Soyla Merck, MD, FACC

## 2024-05-06 NOTE — Patient Instructions (Addendum)
 Medication Instructions:  No Changes *If you need a refill on your cardiac medications before your next appointment, please call your pharmacy*  Lab Work: We will get in touch with your PCP (Dr. Loring) for your  most recent labs  Follow-Up: At [facility], you and your health needs are our priority.  As part of our continuing mission to provide you with exceptional heart care, our providers are all part of one team.  This team includes your primary Cardiologist (physician) and Advanced Practice Providers or APPs (Physician Assistants and Nurse Practitioners) who all work together to provide you with the care you need, when you need it.  Your next appointment:   6 month(s) (Due 11/03/2024)  Provider:   Gayatri A Acharya, MD or Orren Fabry, PA-C or Callie Goodrich, PA-C         Other Instructions Please check your Blood pressures for the next six months. Make a Note of when you had eaten something very salty (like fast food or something salty that you cooked) or very high fat/high calorie foods. Also, note if you had been sick (flu/cold)   [facility]:

## 2024-05-13 ENCOUNTER — Ambulatory Visit: Payer: Self-pay | Admitting: Internal Medicine
